# Patient Record
Sex: Female | Born: 1990 | Race: White | Hispanic: No | Marital: Single | Smoking: Current every day smoker
Health system: Southern US, Community
[De-identification: ages and names within clinical notes are randomized; demographics above are authoritative.]

## PROBLEM LIST (undated history)

## (undated) DIAGNOSIS — M25569 Pain in unspecified knee: Secondary | ICD-10-CM

## (undated) DIAGNOSIS — M2550 Pain in unspecified joint: Secondary | ICD-10-CM

## (undated) DIAGNOSIS — O24419 Gestational diabetes mellitus in pregnancy, unspecified control: Secondary | ICD-10-CM

## (undated) DIAGNOSIS — M79673 Pain in unspecified foot: Secondary | ICD-10-CM

## (undated) DIAGNOSIS — J45909 Unspecified asthma, uncomplicated: Secondary | ICD-10-CM

## (undated) DIAGNOSIS — I10 Essential (primary) hypertension: Secondary | ICD-10-CM

## (undated) DIAGNOSIS — K219 Gastro-esophageal reflux disease without esophagitis: Secondary | ICD-10-CM

## (undated) DIAGNOSIS — R7303 Prediabetes: Secondary | ICD-10-CM

## (undated) DIAGNOSIS — K5900 Constipation, unspecified: Secondary | ICD-10-CM

## (undated) DIAGNOSIS — R5383 Other fatigue: Secondary | ICD-10-CM

## (undated) DIAGNOSIS — M549 Dorsalgia, unspecified: Secondary | ICD-10-CM

## (undated) DIAGNOSIS — E669 Obesity, unspecified: Secondary | ICD-10-CM

## (undated) DIAGNOSIS — M545 Low back pain, unspecified: Secondary | ICD-10-CM

## (undated) DIAGNOSIS — G4730 Sleep apnea, unspecified: Secondary | ICD-10-CM

## (undated) DIAGNOSIS — R002 Palpitations: Secondary | ICD-10-CM

## (undated) DIAGNOSIS — R0602 Shortness of breath: Secondary | ICD-10-CM

## (undated) DIAGNOSIS — K625 Hemorrhage of anus and rectum: Secondary | ICD-10-CM

## (undated) HISTORY — DX: Sleep apnea, unspecified: G47.30

## (undated) HISTORY — DX: Dorsalgia, unspecified: M54.9

## (undated) HISTORY — PX: WISDOM TOOTH EXTRACTION: SHX21

## (undated) HISTORY — DX: Constipation, unspecified: K59.00

## (undated) HISTORY — DX: Other fatigue: R53.83

## (undated) HISTORY — DX: Shortness of breath: R06.02

## (undated) HISTORY — PX: FINGER SURGERY: SHX640

## (undated) HISTORY — PX: DRUG INDUCED ENDOSCOPY: SHX6808

## (undated) HISTORY — DX: Prediabetes: R73.03

## (undated) HISTORY — DX: Obesity, unspecified: E66.9

## (undated) HISTORY — DX: Low back pain, unspecified: M54.50

## (undated) HISTORY — DX: Hemorrhage of anus and rectum: K62.5

## (undated) HISTORY — DX: Gastro-esophageal reflux disease without esophagitis: K21.9

## (undated) HISTORY — DX: Pain in unspecified knee: M25.569

## (undated) HISTORY — DX: Pain in unspecified joint: M25.50

## (undated) HISTORY — DX: Pain in unspecified foot: M79.673

## (undated) HISTORY — PX: TONSILLECTOMY: SUR1361

## (undated) HISTORY — DX: Palpitations: R00.2

## (undated) HISTORY — DX: Unspecified asthma, uncomplicated: J45.909

---
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
## 2011-12-28 HISTORY — PX: ESOPHAGOGASTRODUODENOSCOPY: SHX1529

## 2017-10-15 DIAGNOSIS — J0100 Acute maxillary sinusitis, unspecified: Secondary | ICD-10-CM | POA: Diagnosis not present

## 2017-11-04 DIAGNOSIS — Z124 Encounter for screening for malignant neoplasm of cervix: Secondary | ICD-10-CM | POA: Diagnosis not present

## 2017-11-04 DIAGNOSIS — Z3202 Encounter for pregnancy test, result negative: Secondary | ICD-10-CM | POA: Diagnosis not present

## 2017-11-04 DIAGNOSIS — Z01419 Encounter for gynecological examination (general) (routine) without abnormal findings: Secondary | ICD-10-CM | POA: Diagnosis not present

## 2017-11-04 DIAGNOSIS — R5383 Other fatigue: Secondary | ICD-10-CM | POA: Diagnosis not present

## 2017-11-04 DIAGNOSIS — Z975 Presence of (intrauterine) contraceptive device: Secondary | ICD-10-CM | POA: Diagnosis not present

## 2017-11-04 DIAGNOSIS — R6889 Other general symptoms and signs: Secondary | ICD-10-CM | POA: Diagnosis not present

## 2017-11-04 DIAGNOSIS — Z113 Encounter for screening for infections with a predominantly sexual mode of transmission: Secondary | ICD-10-CM | POA: Diagnosis not present

## 2017-11-04 DIAGNOSIS — N898 Other specified noninflammatory disorders of vagina: Secondary | ICD-10-CM | POA: Diagnosis not present

## 2017-11-04 LAB — HM PAP SMEAR

## 2017-12-28 DIAGNOSIS — Z3046 Encounter for surveillance of implantable subdermal contraceptive: Secondary | ICD-10-CM | POA: Diagnosis not present

## 2018-03-01 DIAGNOSIS — R030 Elevated blood-pressure reading, without diagnosis of hypertension: Secondary | ICD-10-CM | POA: Diagnosis not present

## 2018-03-01 DIAGNOSIS — Z3009 Encounter for other general counseling and advice on contraception: Secondary | ICD-10-CM | POA: Diagnosis not present

## 2018-03-02 DIAGNOSIS — I10 Essential (primary) hypertension: Secondary | ICD-10-CM | POA: Diagnosis not present

## 2018-03-02 DIAGNOSIS — R8290 Unspecified abnormal findings in urine: Secondary | ICD-10-CM | POA: Diagnosis not present

## 2018-03-03 DIAGNOSIS — R8290 Unspecified abnormal findings in urine: Secondary | ICD-10-CM | POA: Diagnosis not present

## 2018-03-04 DIAGNOSIS — R8290 Unspecified abnormal findings in urine: Secondary | ICD-10-CM | POA: Insufficient documentation

## 2018-03-04 DIAGNOSIS — D72829 Elevated white blood cell count, unspecified: Secondary | ICD-10-CM | POA: Diagnosis not present

## 2018-03-04 DIAGNOSIS — I10 Essential (primary) hypertension: Secondary | ICD-10-CM | POA: Insufficient documentation

## 2018-03-11 ENCOUNTER — Telehealth: Payer: Self-pay | Admitting: Hematology and Oncology

## 2018-03-11 ENCOUNTER — Encounter: Payer: Self-pay | Admitting: Hematology and Oncology

## 2018-03-11 NOTE — Telephone Encounter (Signed)
New referral received from Dr. Judee Claraorum for elevated wbc. Pt has been scheduled to see Dr. Pamelia HoitGudena on 8/28 at 345pm. Pt aware to arrive 30 minutes early. Letter mailed.

## 2018-03-30 DIAGNOSIS — R8290 Unspecified abnormal findings in urine: Secondary | ICD-10-CM | POA: Diagnosis not present

## 2018-03-30 DIAGNOSIS — I10 Essential (primary) hypertension: Secondary | ICD-10-CM | POA: Diagnosis not present

## 2018-03-30 DIAGNOSIS — D72829 Elevated white blood cell count, unspecified: Secondary | ICD-10-CM | POA: Insufficient documentation

## 2018-03-30 HISTORY — DX: Elevated white blood cell count, unspecified: D72.829

## 2018-03-31 DIAGNOSIS — R8290 Unspecified abnormal findings in urine: Secondary | ICD-10-CM | POA: Diagnosis not present

## 2018-04-01 MED FILL — CARVEDILOL PHOSPHATE ER 20: 20 | 90 days supply | Qty: 90 | Fill #0

## 2018-04-06 ENCOUNTER — Inpatient Hospital Stay: Payer: 59

## 2018-04-06 ENCOUNTER — Ambulatory Visit: Payer: 59

## 2018-04-06 ENCOUNTER — Inpatient Hospital Stay: Payer: 59 | Attending: Hematology and Oncology | Admitting: Hematology and Oncology

## 2018-04-06 VITALS — BP 139/91 | HR 84 | Temp 98.9°F | Resp 20 | Ht 66.5 in | Wt 338.4 lb

## 2018-04-06 DIAGNOSIS — D72829 Elevated white blood cell count, unspecified: Secondary | ICD-10-CM | POA: Insufficient documentation

## 2018-04-06 DIAGNOSIS — D729 Disorder of white blood cells, unspecified: Secondary | ICD-10-CM

## 2018-04-06 LAB — CBC WITH DIFFERENTIAL (CANCER CENTER ONLY)
BASOS PCT: 1 %
Basophils Absolute: 0.1 10*3/uL (ref 0.0–0.1)
EOS ABS: 0.2 10*3/uL (ref 0.0–0.5)
EOS PCT: 2 %
HCT: 40.6 % (ref 34.8–46.6)
HEMOGLOBIN: 13.2 g/dL (ref 11.6–15.9)
Lymphocytes Relative: 27 %
Lymphs Abs: 3.3 10*3/uL (ref 0.9–3.3)
MCH: 28.2 pg (ref 25.1–34.0)
MCHC: 32.5 g/dL (ref 31.5–36.0)
MCV: 86.7 fL (ref 79.5–101.0)
MONO ABS: 0.5 10*3/uL (ref 0.1–0.9)
Monocytes Relative: 4 %
NEUTROS PCT: 66 %
Neutro Abs: 8 10*3/uL — ABNORMAL HIGH (ref 1.5–6.5)
PLATELETS: 357 10*3/uL (ref 145–400)
RBC: 4.68 MIL/uL (ref 3.70–5.45)
RDW: 13.6 % (ref 11.2–14.5)
WBC Count: 12.1 10*3/uL — ABNORMAL HIGH (ref 3.9–10.3)

## 2018-04-06 LAB — C-REACTIVE PROTEIN: CRP: 4.4 mg/dL — AB (ref <1.0)

## 2018-04-06 NOTE — Progress Notes (Signed)
[facility] CONSULT NOTE  Patient Care Team: Corum, Leeanne Mannan, MD as PCP - General (Family Medicine)  CHIEF COMPLAINTS/PURPOSE OF CONSULTATION:  Elevated neutrophil count  HISTORY OF PRESENTING ILLNESS:  Isabella Guerrero 27 y.o. female is here because of recent diagnosis of leukocytosis and elevated neutrophil count.  Patient does not have any major past medical problems.  She works at the lab at [employer].  She was noted on recent blood work since April to have elevated white blood cell count particularly elevated neutrophils.  There was also slight elevation of platelet count.  Because of this she was referred to [place] for evaluation.  She does not have any major past medical problems other than obesity.  She denies any arthritis or sinusitis are any pain anywhere.  She has some mild upper back pain but that is only recent.  I reviewed her records extensively and collaborated the history with the patient.  MEDICAL HISTORY: Patient does not have any medical problems  SURGICAL HISTORY: Tonsillectomy and wisdom tooth extraction SOCIAL HISTORY: Denies any alcohol or recreational drug use, patient smokes cigarettes half pack a day for 11 years FAMILY HISTORY: Hypertension diabetes and hypothyroidism in the mother, hypertension in the father pancreatic cancer maternal grandmother ALLERGIES: No known drug allergies MEDICATIONS: Albuterol metered-dose inhaler REVIEW OF SYSTEMS:   Constitutional: Denies fevers, chills or abnormal night sweats Eyes: Denies blurriness of vision, double vision or watery eyes Ears, nose, mouth, throat, and face: Denies mucositis or sore throat Respiratory: Denies cough, dyspnea or wheezes Cardiovascular: Denies palpitation, chest discomfort or lower extremity swelling Gastrointestinal:  Denies nausea, heartburn or change in bowel habits Skin: Denies abnormal skin rashes Lymphatics: Denies new lymphadenopathy or easy bruising Neurological:Denies  numbness, tingling or new weaknesses Behavioral/Psych: Mood is stable, no new changes    All other systems were reviewed with the patient and are negative.  PHYSICAL EXAMINATION: ECOG PERFORMANCE STATUS: 0 - Asymptomatic  Vitals:   04/06/18 1543  BP: (!) 139/91  Pulse: 84  Resp: 20  Temp: 98.9 F (37.2 C)  SpO2: 97%   Filed Weights   04/06/18 1543  Weight: (!) 338 lb 6.4 oz (153.5 kg)    GENERAL:alert, no distress and comfortable SKIN: skin color, texture, turgor are normal, no rashes or significant lesions EYES: normal, conjunctiva are pink and non-injected, sclera clear OROPHARYNX:no exudate, no erythema and lips, buccal mucosa, and tongue normal  NECK: supple, thyroid normal size, non-tender, without nodularity LYMPH:  no palpable lymphadenopathy in the cervical, axillary or inguinal LUNGS: clear to auscultation and percussion with normal breathing effort HEART: regular rate & rhythm and no murmurs and no lower extremity edema ABDOMEN:abdomen soft, non-tender and normal bowel sounds Musculoskeletal:no cyanosis of digits and no clubbing  PSYCH: alert & oriented x 3 with fluent speech NEURO: no focal motor/sensory deficits  LABORATORY DATA:  I have reviewed the data as listed Lab Results  Component Value Date   WBC 12.1 (H) 04/06/2018   HGB 13.2 04/06/2018   HCT 40.6 04/06/2018   MCV 86.7 04/06/2018   PLT 357 04/06/2018   No results found for: NA, K, CL, CO2  RADIOGRAPHIC STUDIES: I have personally reviewed the radiological reports and agreed with the findings in the report.  ASSESSMENT AND PLAN:  Neutrophilia Differential diagnosis of mild leukocytosis (WBC count 13.8, ANC 9.3 on 11/10/2017 Previous blood work review revealed that patient had chronic mild leukocytosis  Differential diagnosis of chronic neutrophilia 1. Inflammations: The only probable inflammation is  gastritis 2. infections: Unlikely in her situation 3. Medications: Patient does not take any  steroids 4. Bone marrow disorders  Workup: 1. CBC with differential to evaluate the peripheral smear: WBC 12.1, neutrophils 8, platelets 357 2. flow cytometry for leukemia 3. CRP  If all the above tests are normal, we can see the patient on an as-needed basis. I will call her with the results of these tests   All questions were answered. The patient knows to call the clinic with any problems, questions or concerns.    Tamsen MeekViinay K Chaz Ronning, MD 04/06/18

## 2018-04-06 NOTE — Assessment & Plan Note (Signed)
Differential diagnosis of mild leukocytosis (WBC count 13.8, ANC 9.3 on 11/10/2017 Previous blood work review revealed that patient had chronic mild leukocytosis  Differential diagnosis of chronic neutrophilia 1. Inflammations: The only probable inflammation is gastritis 2. infections: Unlikely in her situation 3. Medications: Patient does not take any steroids 4. Bone marrow disorders  Workup: 1. CBC with differential to evaluate the peripheral smear  2. flow cytometry for leukemia 3. CRP  If all the above tests are normal, we can see the patient on an as-needed basis. I will call her with the results of these tests

## 2018-04-12 LAB — FLOW CYTOMETRY

## 2018-06-15 MED FILL — CARVEDILOL PHOSPHATE ER 20: 20 | 90 days supply | Qty: 90 | Fill #1

## 2018-07-12 DIAGNOSIS — I10 Essential (primary) hypertension: Secondary | ICD-10-CM | POA: Diagnosis not present

## 2018-07-12 DIAGNOSIS — N926 Irregular menstruation, unspecified: Secondary | ICD-10-CM | POA: Diagnosis not present

## 2018-07-12 MED FILL — LABETALOL HCL 100 MG TABLET: 100 | 30 days supply | Qty: 60 | Fill #0

## 2018-07-13 DIAGNOSIS — N926 Irregular menstruation, unspecified: Secondary | ICD-10-CM

## 2018-07-13 HISTORY — DX: Irregular menstruation, unspecified: N92.6

## 2018-08-01 DIAGNOSIS — J0100 Acute maxillary sinusitis, unspecified: Secondary | ICD-10-CM | POA: Diagnosis not present

## 2018-08-17 DIAGNOSIS — N911 Secondary amenorrhea: Secondary | ICD-10-CM | POA: Diagnosis not present

## 2018-08-17 DIAGNOSIS — Z3201 Encounter for pregnancy test, result positive: Secondary | ICD-10-CM | POA: Diagnosis not present

## 2018-08-17 MED FILL — LABETALOL HCL 100 MG TABLET: 100 | 30 days supply | Qty: 60 | Fill #1

## 2018-08-24 DIAGNOSIS — Z3490 Encounter for supervision of normal pregnancy, unspecified, unspecified trimester: Secondary | ICD-10-CM

## 2018-08-24 DIAGNOSIS — Z368A Encounter for antenatal screening for other genetic defects: Secondary | ICD-10-CM | POA: Diagnosis not present

## 2018-08-24 DIAGNOSIS — Z113 Encounter for screening for infections with a predominantly sexual mode of transmission: Secondary | ICD-10-CM | POA: Diagnosis not present

## 2018-08-24 DIAGNOSIS — Z363 Encounter for antenatal screening for malformations: Secondary | ICD-10-CM | POA: Diagnosis not present

## 2018-08-24 DIAGNOSIS — O10011 Pre-existing essential hypertension complicating pregnancy, first trimester: Secondary | ICD-10-CM | POA: Diagnosis not present

## 2018-08-24 DIAGNOSIS — O26891 Other specified pregnancy related conditions, first trimester: Secondary | ICD-10-CM | POA: Diagnosis not present

## 2018-08-24 DIAGNOSIS — O99211 Obesity complicating pregnancy, first trimester: Secondary | ICD-10-CM | POA: Diagnosis not present

## 2018-08-24 DIAGNOSIS — Z3689 Encounter for other specified antenatal screening: Secondary | ICD-10-CM | POA: Diagnosis not present

## 2018-08-24 DIAGNOSIS — Z3682 Encounter for antenatal screening for nuchal translucency: Secondary | ICD-10-CM | POA: Diagnosis not present

## 2018-08-24 DIAGNOSIS — Z3A12 12 weeks gestation of pregnancy: Secondary | ICD-10-CM | POA: Diagnosis not present

## 2018-08-24 HISTORY — DX: Encounter for supervision of normal pregnancy, unspecified, unspecified trimester: Z34.90

## 2018-08-24 LAB — OB RESULTS CONSOLE GC/CHLAMYDIA
Chlamydia: NEGATIVE
Gonorrhea: NEGATIVE

## 2018-08-24 LAB — OB RESULTS CONSOLE RPR: RPR: NONREACTIVE

## 2018-08-24 LAB — OB RESULTS CONSOLE RUBELLA ANTIBODY, IGM: Rubella: IMMUNE

## 2018-08-24 LAB — OB RESULTS CONSOLE HEPATITIS B SURFACE ANTIGEN: Hepatitis B Surface Ag: NEGATIVE

## 2018-08-24 LAB — OB RESULTS CONSOLE HIV ANTIBODY (ROUTINE TESTING): HIV: NONREACTIVE

## 2018-08-24 LAB — OB RESULTS CONSOLE ANTIBODY SCREEN: Antibody Screen: NEGATIVE

## 2018-08-24 LAB — OB RESULTS CONSOLE ABO/RH: RH Type: NEGATIVE

## 2018-09-14 MED FILL — LABETALOL HCL 100 MG TABS: 100 | 30 days supply | Qty: 60 | Fill #0

## 2018-09-14 MED FILL — METOCLOPRAMIDE 10 MG TABLET: 10 | 30 days supply | Qty: 90 | Fill #0

## 2018-09-14 MED FILL — PANTOPRAZOLE SOD DR 40 MG T: 40 | 30 days supply | Qty: 30 | Fill #0

## 2018-09-29 MED FILL — CYCLOBENZAPRINE HCL 10 MG T: 10 | 10 days supply | Qty: 30 | Fill #0

## 2018-10-13 DIAGNOSIS — O99212 Obesity complicating pregnancy, second trimester: Secondary | ICD-10-CM | POA: Diagnosis not present

## 2018-10-13 DIAGNOSIS — Z363 Encounter for antenatal screening for malformations: Secondary | ICD-10-CM | POA: Diagnosis not present

## 2018-10-13 DIAGNOSIS — Z3A19 19 weeks gestation of pregnancy: Secondary | ICD-10-CM | POA: Diagnosis not present

## 2018-10-13 MED FILL — PANTOPRAZOLE SOD DR 40 MG T: 40 | 30 days supply | Qty: 30 | Fill #1

## 2018-10-13 MED FILL — LABETALOL HCL 100 MG TABS: 100 | 30 days supply | Qty: 60 | Fill #1

## 2018-10-13 MED FILL — CYCLOBENZAPRINE HCL 10 MG T: 10 | 10 days supply | Qty: 30 | Fill #1

## 2018-10-13 MED FILL — METOCLOPRAMIDE 10 MG TABLET: 10 | 30 days supply | Qty: 90 | Fill #1

## 2018-11-03 DIAGNOSIS — Z362 Encounter for other antenatal screening follow-up: Secondary | ICD-10-CM | POA: Diagnosis not present

## 2018-11-03 DIAGNOSIS — Z3A22 22 weeks gestation of pregnancy: Secondary | ICD-10-CM | POA: Diagnosis not present

## 2018-11-03 DIAGNOSIS — O99212 Obesity complicating pregnancy, second trimester: Secondary | ICD-10-CM | POA: Diagnosis not present

## 2018-11-11 MED FILL — PANTOPRAZOLE SOD DR 40 MG T: 40 | 90 days supply | Qty: 90 | Fill #2

## 2018-11-30 MED FILL — METOCLOPRAMIDE 10 MG TABLET: 10 | 30 days supply | Qty: 60 | Fill #0

## 2018-12-09 DIAGNOSIS — O24419 Gestational diabetes mellitus in pregnancy, unspecified control: Secondary | ICD-10-CM

## 2018-12-09 HISTORY — DX: Gestational diabetes mellitus in pregnancy, unspecified control: O24.419

## 2018-12-15 DIAGNOSIS — Z6791 Unspecified blood type, Rh negative: Secondary | ICD-10-CM | POA: Diagnosis not present

## 2018-12-15 DIAGNOSIS — Z3A28 28 weeks gestation of pregnancy: Secondary | ICD-10-CM | POA: Diagnosis not present

## 2018-12-15 DIAGNOSIS — I10 Essential (primary) hypertension: Secondary | ICD-10-CM | POA: Diagnosis not present

## 2018-12-15 DIAGNOSIS — K219 Gastro-esophageal reflux disease without esophagitis: Secondary | ICD-10-CM | POA: Diagnosis not present

## 2018-12-15 DIAGNOSIS — O99213 Obesity complicating pregnancy, third trimester: Secondary | ICD-10-CM | POA: Diagnosis not present

## 2018-12-15 DIAGNOSIS — Z3689 Encounter for other specified antenatal screening: Secondary | ICD-10-CM | POA: Diagnosis not present

## 2018-12-15 DIAGNOSIS — Z23 Encounter for immunization: Secondary | ICD-10-CM | POA: Diagnosis not present

## 2018-12-19 DIAGNOSIS — O99810 Abnormal glucose complicating pregnancy: Secondary | ICD-10-CM | POA: Diagnosis not present

## 2018-12-20 MED FILL — PANTOPRAZOLE SOD DR 40 MG T: 40 | 90 days supply | Qty: 180 | Fill #0

## 2018-12-20 MED FILL — LABETALOL HCL 100 MG TABLET: 100 | 30 days supply | Qty: 60 | Fill #2

## 2018-12-22 ENCOUNTER — Encounter: Payer: Self-pay | Admitting: Dietician

## 2018-12-22 ENCOUNTER — Ambulatory Visit: Payer: 59 | Admitting: Dietician

## 2018-12-22 DIAGNOSIS — O24410 Gestational diabetes mellitus in pregnancy, diet controlled: Secondary | ICD-10-CM

## 2018-12-22 NOTE — Progress Notes (Signed)
This visit was completed via telephone due to the COVID-19 pandemic.   I spoke with Isabella Guerrero and verified that I was speaking with the correct person with two patient identifiers (full name and date of birth).   I discussed the limitations related to this kind of visit and the patient is willing to proceed.  Time:  1555-1700  Patient lives with her boyfriend.  She does the shopping and cooking. She is a Water quality scientist at [employer].  She is currently on furlough due to COVID.  She states that she has a good support system.  Her mom has type 1 diabetes diagnosed in her 22's.  She is accustomed to checking her BG occasionally when low. She quit smoking when she became pregnant.  Weight hx:  339 lbs prior to pregnancy and 329 lbs this week.  Weight loss related to nausea and vomiting during the whole pregnancy.  She is active but overall not getting specific exercise.  Diet hx:  Today More nausea with eggs, meat, sometimes vegetables Breakfast:  Egg sandwich on white toast with mayo and cheese (ate 3/4 and vomited) Then ate 1/4 sandwich Lunch:  Skips due to nausea daily Snack:  Apple, pickles Dinner:  Salad, boiled egg, [place] dressing Snack:  Yogurt Beverages:  Water, Whole milk, camomile and peppermint tea   This referral for GDM was completed 12/22/2018 via phoen due to COVID 19.   States the definition of Gestational Diabetes  States why dietary management is important in controlling blood glucose  Describes the effects each nutrient has on blood glucose levels  Demonstrates ability to create a balanced meal plan  Demonstrates carbohydrate counting   States when to check blood glucose levels  Demonstrates proper blood glucose monitoring techniques  States the effect of stress and exercise on blood glucose levels  Patient instructed to monitor glucose levels: FBS: 60 - <90 1 hour: <140 2 hour: <120  *Patient received handouts: via e-mail  Nutrition Diabetes and  Pregnancy  TNH healthy pregnancy info  She was instructed to call and request meter, strips and lancing needles from her OB to be sent to the [facility] of her choice..  Patient will be seen for follow-up as needed.

## 2018-12-23 MED FILL — FREESTYLE LITE TEST STRIP: 25 days supply | Qty: 100 | Fill #0

## 2018-12-23 MED FILL — FREESTYLE LITE METER: 30 days supply | Qty: 1 | Fill #0

## 2018-12-23 MED FILL — FREESTYLE LANCETS: 25 days supply | Qty: 100 | Fill #0

## 2018-12-28 MED FILL — ONDANSETRON ODT 8 MG TABLET: 8 | 10 days supply | Qty: 30 | Fill #0

## 2019-01-16 DIAGNOSIS — Z3A32 32 weeks gestation of pregnancy: Secondary | ICD-10-CM | POA: Diagnosis not present

## 2019-01-16 DIAGNOSIS — O3663X0 Maternal care for excessive fetal growth, third trimester, not applicable or unspecified: Secondary | ICD-10-CM | POA: Diagnosis not present

## 2019-01-18 MED FILL — LABETALOL HCL 100 MG TABLET: 100 | 30 days supply | Qty: 60 | Fill #3

## 2019-01-18 MED FILL — METOCLOPRAMIDE 10 MG TABLET: 10 | 30 days supply | Qty: 60 | Fill #1

## 2019-02-06 DIAGNOSIS — Z3A35 35 weeks gestation of pregnancy: Secondary | ICD-10-CM | POA: Diagnosis not present

## 2019-02-06 DIAGNOSIS — O99213 Obesity complicating pregnancy, third trimester: Secondary | ICD-10-CM | POA: Diagnosis not present

## 2019-02-06 DIAGNOSIS — O3663X0 Maternal care for excessive fetal growth, third trimester, not applicable or unspecified: Secondary | ICD-10-CM | POA: Diagnosis not present

## 2019-02-08 DIAGNOSIS — O321XX0 Maternal care for breech presentation, not applicable or unspecified: Secondary | ICD-10-CM | POA: Diagnosis not present

## 2019-02-08 DIAGNOSIS — Z3A36 36 weeks gestation of pregnancy: Secondary | ICD-10-CM | POA: Diagnosis not present

## 2019-02-08 DIAGNOSIS — O99213 Obesity complicating pregnancy, third trimester: Secondary | ICD-10-CM | POA: Diagnosis not present

## 2019-02-08 DIAGNOSIS — O10013 Pre-existing essential hypertension complicating pregnancy, third trimester: Secondary | ICD-10-CM | POA: Diagnosis not present

## 2019-02-08 DIAGNOSIS — Z3685 Encounter for antenatal screening for Streptococcus B: Secondary | ICD-10-CM | POA: Diagnosis not present

## 2019-02-08 DIAGNOSIS — O24410 Gestational diabetes mellitus in pregnancy, diet controlled: Secondary | ICD-10-CM | POA: Diagnosis not present

## 2019-02-13 MED FILL — METOCLOPRAMIDE 10 MG TABLET: 10 | 30 days supply | Qty: 60 | Fill #2

## 2019-02-20 ENCOUNTER — Encounter (HOSPITAL_COMMUNITY): Payer: Self-pay | Admitting: *Deleted

## 2019-02-20 NOTE — Patient Instructions (Signed)
Isabella Guerrero  [birth year]   Your procedure is scheduled on:  03/01/2019  Arrive at 57 at Entrance C on [address] [facility]  and [address]. You are invited to use the FREE valet parking or use the Visitor's parking deck.  Pick up the phone at the desk and dial [phone].  Call this number if you have problems the morning of surgery: [phone]  Remember:   Do not eat food:(After Midnight) Desps de medianoche.  Do not drink clear liquids: (After Midnight) Desps de medianoche.  Take these medicines the morning of surgery with A SIP OF WATER:  Take your labetalol and reglan as prescribed   Do not wear jewelry, make-up or nail polish.  Do not wear lotions, powders, or perfumes. Do not wear deodorant.  Do not shave 48 hours prior to surgery.  Do not bring valuables to the hospital.  [facility] is not   responsible for any belongings or valuables brought to the hospital.  Contacts, dentures or bridgework may not be worn into surgery.  Leave suitcase in the car. After surgery it may be brought to your room.  For patients admitted to the hospital, checkout time is 11:00 AM the day of              discharge.      Please read over the following fact sheets that you were given:     Preparing for Surgery

## 2019-02-23 DIAGNOSIS — O368130 Decreased fetal movements, third trimester, not applicable or unspecified: Secondary | ICD-10-CM | POA: Diagnosis not present

## 2019-02-23 DIAGNOSIS — Z3A38 38 weeks gestation of pregnancy: Secondary | ICD-10-CM | POA: Diagnosis not present

## 2019-02-27 ENCOUNTER — Ambulatory Visit (HOSPITAL_COMMUNITY)
Admission: RE | Admit: 2019-02-27 | Discharge: 2019-02-27 | Disposition: A | Discharge status: Home / Self Care | Payer: 59 | Source: Ambulatory Visit | Attending: Obstetrics and Gynecology | Admitting: Obstetrics and Gynecology

## 2019-02-27 ENCOUNTER — Other Ambulatory Visit: Payer: Self-pay

## 2019-02-27 DIAGNOSIS — O99214 Obesity complicating childbirth: Secondary | ICD-10-CM | POA: Diagnosis not present

## 2019-02-27 DIAGNOSIS — O24420 Gestational diabetes mellitus in childbirth, diet controlled: Secondary | ICD-10-CM | POA: Diagnosis not present

## 2019-02-27 DIAGNOSIS — Z87891 Personal history of nicotine dependence: Secondary | ICD-10-CM | POA: Diagnosis not present

## 2019-02-27 DIAGNOSIS — O26893 Other specified pregnancy related conditions, third trimester: Secondary | ICD-10-CM | POA: Diagnosis not present

## 2019-02-27 DIAGNOSIS — O1002 Pre-existing essential hypertension complicating childbirth: Secondary | ICD-10-CM | POA: Diagnosis not present

## 2019-02-27 DIAGNOSIS — O321XX0 Maternal care for breech presentation, not applicable or unspecified: Secondary | ICD-10-CM | POA: Diagnosis not present

## 2019-02-27 DIAGNOSIS — Z1159 Encounter for screening for other viral diseases: Secondary | ICD-10-CM

## 2019-02-27 DIAGNOSIS — O99824 Streptococcus B carrier state complicating childbirth: Secondary | ICD-10-CM | POA: Diagnosis not present

## 2019-02-27 HISTORY — DX: Essential (primary) hypertension: I10

## 2019-02-27 HISTORY — DX: Gestational diabetes mellitus in pregnancy, unspecified control: O24.419

## 2019-02-27 LAB — SARS CORONAVIRUS 2 BY RT PCR (HOSPITAL ORDER, PERFORMED IN ~~LOC~~ HOSPITAL LAB): SARS Coronavirus 2: NEGATIVE

## 2019-02-27 NOTE — MAU Note (Signed)
Covid swab collected. Pt tolerated well. PT asymptomatic 

## 2019-02-28 NOTE — H&P (Signed)
Isabella Guerrero is a 28 y.o. female  G1P0 at 42 0/7 weeks (EDD 03/08/19 by LMP c/w 12 week [place])  presenting for scheduled primary c-section for persistent breech lie.  Prenatal care significant for morbid obesity with BMI 64, gestational diabetes that is well-controlled on diet.  She also has a h/o mild CHTN and has been stable on labetalol 50mg  po BID the entire pregnancy.  She has normal baseline labs and has taken baby ASA the entire pregnancy.  She quit smoking with pregnancy and is varicella non-immune.    OB History    Gravida  1   Para      Term      Preterm      AB      Living        SAB      TAB      Ectopic      Multiple      Live Births             Past Medical History:  Diagnosis Date  . GDM (gestational diabetes mellitus) 12/2018  . Gestational diabetes   . Hypertension    Past Surgical History:  Procedure Laterality Date  . DRUG INDUCED ENDOSCOPY    . FINGER SURGERY    . TONSILLECTOMY    . WISDOM TOOTH EXTRACTION     Family History: family history includes Colon cancer in her maternal grandmother; Diabetes in her mother; Esophageal cancer in her maternal uncle; Heart disease in her mother; Hypertension in her father and mother. Social History:  reports that she has quit smoking. She has never used smokeless tobacco. She reports previous alcohol use. She reports that she does not use drugs.     Maternal Diabetes: Yes:  Diabetes Type:  Diet controlled Genetic Screening: Declined Maternal Ultrasounds/Referrals: Normal Fetal Ultrasounds or other Referrals:  None Maternal Substance Abuse:  No Significant Maternal Medications:  Meds include: Other:  Significant Maternal Lab Results:  Group B Strep positive and Rh negative Other Comments:  None  Review of Systems  Constitutional: Negative for fever.  Cardiovascular: Positive for leg swelling.  Gastrointestinal: Negative for abdominal pain.  Neurological: Negative for headaches.   Maternal Medical  History:  Contractions: Frequency: rare.   Perceived severity is mild.    Fetal activity: Perceived fetal activity is normal.    Prenatal Complications - Diabetes: gestational. Diabetes is managed by diet.        Last menstrual period 06/01/2018. Exam Physical Exam  Prenatal labs: ABO, Rh: A/Negative/-- (01/15 0000) Antibody: Negative (01/15 0000) Rubella: Immune (01/15 0000) RPR: Nonreactive (01/15 0000)  HBsAg: Negative (01/15 0000)  HIV: Non-reactive (01/15 0000)  GBS:   Positive One hour GCT 140 Three hour GTT abnormal  Assessment/Plan: I have d/w pt c-section in detail for persistent breech lie.  Reviewed risks of bleeding, infection and possible damage to bowel or bladder.  Ready to proceed.  Logan Bores 02/28/2019, 4:24 PM

## 2019-02-28 NOTE — Anesthesia Preprocedure Evaluation (Addendum)
Anesthesia Evaluation  Patient identified by MRN, date of birth, ID band Patient awake    Reviewed: Allergy & Precautions, NPO status , Patient's Chart, lab work & pertinent test results  Airway Mallampati: II  TM Distance: >3 FB Neck ROM: Full    Dental no notable dental hx.    Pulmonary former smoker,    Pulmonary exam normal breath sounds clear to auscultation       Cardiovascular hypertension, Pt. on home beta blockers and Pt. on medications Normal cardiovascular exam Rhythm:Regular Rate:Normal     Neuro/Psych negative neurological ROS  negative psych ROS   GI/Hepatic negative GI ROS, Neg liver ROS,   Endo/Other  diabetes, GestationalMorbid obesity  Renal/GU negative Renal ROS     Musculoskeletal negative musculoskeletal ROS (+)   Abdominal (+) + obese,   Peds  Hematology negative hematology ROS (+)   Anesthesia Other Findings Breech  Reproductive/Obstetrics                            Anesthesia Physical Anesthesia Plan  ASA: IV  Anesthesia Plan: Spinal   Post-op Pain Management:    Induction:   PONV Risk Score and Plan: 2 and Ondansetron, Dexamethasone and Treatment may vary due to age or medical condition  Airway Management Planned: Natural Airway  Additional Equipment:   Intra-op Plan:   Post-operative Plan:   Informed Consent: I have reviewed the patients History and Physical, chart, labs and discussed the procedure including the risks, benefits and alternatives for the proposed anesthesia with the patient or authorized representative who has indicated his/her understanding and acceptance.     Dental advisory given  Plan Discussed with: CRNA  Anesthesia Plan Comments:        Anesthesia Quick Evaluation

## 2019-03-01 ENCOUNTER — Encounter (HOSPITAL_COMMUNITY): Payer: Self-pay

## 2019-03-01 ENCOUNTER — Inpatient Hospital Stay (HOSPITAL_COMMUNITY)
Admission: RE | Admit: 2019-03-01 | Discharge: 2019-03-03 | DRG: 787 | Disposition: A | Discharge status: Home / Self Care | Payer: 59 | Attending: Obstetrics and Gynecology | Admitting: Obstetrics and Gynecology

## 2019-03-01 ENCOUNTER — Other Ambulatory Visit: Payer: Self-pay

## 2019-03-01 ENCOUNTER — Inpatient Hospital Stay (HOSPITAL_COMMUNITY): Payer: 59 | Admitting: Anesthesiology

## 2019-03-01 ENCOUNTER — Encounter (HOSPITAL_COMMUNITY)
Admission: RE | Disposition: A | Discharge status: Home / Self Care | Payer: Self-pay | Source: Home / Self Care | Attending: Obstetrics and Gynecology

## 2019-03-01 DIAGNOSIS — Z1159 Encounter for screening for other viral diseases: Secondary | ICD-10-CM

## 2019-03-01 DIAGNOSIS — Z3A00 Weeks of gestation of pregnancy not specified: Secondary | ICD-10-CM | POA: Diagnosis not present

## 2019-03-01 DIAGNOSIS — Z98891 History of uterine scar from previous surgery: Secondary | ICD-10-CM

## 2019-03-01 DIAGNOSIS — O10013 Pre-existing essential hypertension complicating pregnancy, third trimester: Secondary | ICD-10-CM | POA: Diagnosis not present

## 2019-03-01 DIAGNOSIS — O1002 Pre-existing essential hypertension complicating childbirth: Secondary | ICD-10-CM | POA: Diagnosis present

## 2019-03-01 DIAGNOSIS — Z3A39 39 weeks gestation of pregnancy: Secondary | ICD-10-CM

## 2019-03-01 DIAGNOSIS — O99214 Obesity complicating childbirth: Secondary | ICD-10-CM | POA: Diagnosis present

## 2019-03-01 DIAGNOSIS — Z6791 Unspecified blood type, Rh negative: Secondary | ICD-10-CM

## 2019-03-01 DIAGNOSIS — O321XX0 Maternal care for breech presentation, not applicable or unspecified: Secondary | ICD-10-CM | POA: Diagnosis not present

## 2019-03-01 DIAGNOSIS — O99213 Obesity complicating pregnancy, third trimester: Secondary | ICD-10-CM | POA: Diagnosis not present

## 2019-03-01 DIAGNOSIS — Z87891 Personal history of nicotine dependence: Secondary | ICD-10-CM | POA: Diagnosis not present

## 2019-03-01 DIAGNOSIS — O99824 Streptococcus B carrier state complicating childbirth: Secondary | ICD-10-CM | POA: Diagnosis present

## 2019-03-01 DIAGNOSIS — O26893 Other specified pregnancy related conditions, third trimester: Secondary | ICD-10-CM | POA: Diagnosis present

## 2019-03-01 DIAGNOSIS — O24420 Gestational diabetes mellitus in childbirth, diet controlled: Secondary | ICD-10-CM | POA: Diagnosis present

## 2019-03-01 HISTORY — DX: History of uterine scar from previous surgery: Z98.891

## 2019-03-01 HISTORY — DX: Maternal care for breech presentation, not applicable or unspecified: O32.1XX0

## 2019-03-01 LAB — CBC
HCT: 38.5 % (ref 36.0–46.0)
Hemoglobin: 12.1 g/dL (ref 12.0–15.0)
MCH: 27.6 pg (ref 26.0–34.0)
MCHC: 31.4 g/dL (ref 30.0–36.0)
MCV: 87.9 fL (ref 80.0–100.0)
Platelets: 400 10*3/uL (ref 150–400)
RBC: 4.38 MIL/uL (ref 3.87–5.11)
RDW: 12.8 % (ref 11.5–15.5)
WBC: 15.6 10*3/uL — ABNORMAL HIGH (ref 4.0–10.5)
nRBC: 0 % (ref 0.0–0.2)

## 2019-03-01 LAB — TYPE AND SCREEN
ABO/RH(D): A NEG
Antibody Screen: NEGATIVE

## 2019-03-01 LAB — BASIC METABOLIC PANEL
Anion gap: 9 (ref 5–15)
BUN: 11 mg/dL (ref 6–20)
CO2: 23 mmol/L (ref 22–32)
Calcium: 9.4 mg/dL (ref 8.9–10.3)
Chloride: 104 mmol/L (ref 98–111)
Creatinine, Ser: 0.63 mg/dL (ref 0.44–1.00)
GFR calc Af Amer: >60 mL/min (ref >60)
GFR calc non Af Amer: >60 mL/min (ref >60)
Glucose, Bld: 120 mg/dL — ABNORMAL HIGH (ref 70–99)
Potassium: 4.5 mmol/L (ref 3.5–5.1)
Sodium: 136 mmol/L (ref 135–145)

## 2019-03-01 LAB — GLUCOSE, CAPILLARY
Glucose-Capillary: 116 mg/dL — ABNORMAL HIGH (ref 70–99)
Glucose-Capillary: 98 mg/dL (ref 70–99)

## 2019-03-01 LAB — ABO/RH: ABO/RH(D): A NEG

## 2019-03-01 SURGERY — Surgical Case
Anesthesia: Spinal

## 2019-03-01 MED ORDER — TETANUS-DIPHTH-ACELL PERTUSSIS 5-2.5-18.5 LF-MCG/0.5 IM SUSP: 0.5000 mL | Freq: Once | INTRAMUSCULAR | Status: DC

## 2019-03-01 MED ORDER — DIPHENHYDRAMINE HCL 25 MG PO CAPS: 25.0000 mg | ORAL_CAPSULE | ORAL | Status: DC | PRN

## 2019-03-01 MED ORDER — OXYTOCIN 40 UNITS IN NORMAL SALINE INFUSION - SIMPLE MED: INTRAVENOUS | Status: DC | PRN

## 2019-03-01 MED ORDER — ALBUTEROL SULFATE HFA 108 (90 BASE) MCG/ACT IN AERS: 2.0000 | INHALATION_SPRAY | RESPIRATORY_TRACT | Status: DC

## 2019-03-01 MED ORDER — SOD CITRATE-CITRIC ACID 500-334 MG/5ML PO SOLN
ORAL | Status: AC
  Filled 2019-03-01: qty 30

## 2019-03-01 MED ORDER — NALBUPHINE HCL 10 MG/ML IJ SOLN: 5.0000 mg | INTRAMUSCULAR | Status: DC | PRN

## 2019-03-01 MED ORDER — ONDANSETRON HCL 4 MG/2ML IJ SOLN: 4.0000 mg | Freq: Three times a day (TID) | INTRAMUSCULAR | Status: DC | PRN

## 2019-03-01 MED ORDER — ACETAMINOPHEN 500 MG PO TABS
1000.0000 mg | ORAL_TABLET | Freq: Four times a day (QID) | ORAL | Status: DC
  Administered 2019-03-01 – 2019-03-03 (×8): 1000 mg via ORAL
  Filled 2019-03-01 (×10): qty 2

## 2019-03-01 MED ORDER — NALBUPHINE HCL 10 MG/ML IJ SOLN: 5.0000 mg | Freq: Once | INTRAMUSCULAR | Status: DC | PRN

## 2019-03-01 MED ORDER — PRENATAL MULTIVITAMIN CH
1.0000 | ORAL_TABLET | Freq: Every day | ORAL | Status: DC
  Administered 2019-03-02 – 2019-03-03 (×2): 1 via ORAL
  Filled 2019-03-01 (×2): qty 1

## 2019-03-01 MED ORDER — HYDROMORPHONE HCL 1 MG/ML IJ SOLN
INTRAMUSCULAR | Status: AC
  Filled 2019-03-01: qty 1

## 2019-03-01 MED ORDER — PHENYLEPHRINE 40 MCG/ML (10ML) SYRINGE FOR IV PUSH (FOR BLOOD PRESSURE SUPPORT)
PREFILLED_SYRINGE | INTRAVENOUS | Status: AC
  Filled 2019-03-01: qty 10

## 2019-03-01 MED ORDER — DIPHENHYDRAMINE HCL 50 MG/ML IJ SOLN: 12.5000 mg | Freq: Four times a day (QID) | INTRAMUSCULAR | Status: DC | PRN

## 2019-03-01 MED ORDER — ENOXAPARIN SODIUM 80 MG/0.8ML ~~LOC~~ SOLN
80.0000 mg | SUBCUTANEOUS | Status: DC
  Administered 2019-03-02 – 2019-03-03 (×2): 80 mg via SUBCUTANEOUS
  Filled 2019-03-01 (×2): qty 0.8

## 2019-03-01 MED ORDER — NALOXONE HCL 4 MG/10ML IJ SOLN
1.0000 ug/kg/h | INTRAVENOUS | Status: DC | PRN
  Filled 2019-03-01: qty 5

## 2019-03-01 MED ORDER — OXYTOCIN 10 UNIT/ML IJ SOLN
INTRAMUSCULAR | Status: DC | PRN
  Administered 2019-03-01: 40 [IU]

## 2019-03-01 MED ORDER — LACTATED RINGERS IV SOLN
INTRAVENOUS | Status: DC
  Administered 2019-03-01 (×2): via INTRAVENOUS

## 2019-03-01 MED ORDER — PHENYLEPHRINE HCL-NACL 20-0.9 MG/250ML-% IV SOLN
INTRAVENOUS | Status: AC
  Filled 2019-03-01: qty 250

## 2019-03-01 MED ORDER — SIMETHICONE 80 MG PO CHEW
80.0000 mg | CHEWABLE_TABLET | ORAL | Status: DC
  Administered 2019-03-02 (×2): 80 mg via ORAL
  Filled 2019-03-01 (×2): qty 1

## 2019-03-01 MED ORDER — PROMETHAZINE HCL 25 MG/ML IJ SOLN: 6.2500 mg | INTRAMUSCULAR | Status: DC | PRN

## 2019-03-01 MED ORDER — DEXAMETHASONE SODIUM PHOSPHATE 10 MG/ML IJ SOLN
INTRAMUSCULAR | Status: AC
  Filled 2019-03-01: qty 1

## 2019-03-01 MED ORDER — DEXAMETHASONE SODIUM PHOSPHATE 10 MG/ML IJ SOLN
INTRAMUSCULAR | Status: DC | PRN
  Administered 2019-03-01: 10 mg via INTRAVENOUS

## 2019-03-01 MED ORDER — ONDANSETRON HCL 4 MG/2ML IJ SOLN
INTRAMUSCULAR | Status: AC
  Filled 2019-03-01: qty 2

## 2019-03-01 MED ORDER — PANTOPRAZOLE SODIUM 40 MG PO TBEC
40.0000 mg | DELAYED_RELEASE_TABLET | Freq: Two times a day (BID) | ORAL | Status: DC
  Administered 2019-03-01 – 2019-03-03 (×4): 40 mg via ORAL
  Filled 2019-03-01 (×4): qty 1

## 2019-03-01 MED ORDER — BUPIVACAINE IN DEXTROSE 0.75-8.25 % IT SOLN
INTRATHECAL | Status: DC | PRN
  Administered 2019-03-01: 1.6 mL via INTRATHECAL

## 2019-03-01 MED ORDER — IBUPROFEN 800 MG PO TABS
800.0000 mg | ORAL_TABLET | Freq: Three times a day (TID) | ORAL | Status: DC
  Administered 2019-03-01 – 2019-03-03 (×7): 800 mg via ORAL
  Filled 2019-03-01 (×7): qty 1

## 2019-03-01 MED ORDER — NALOXONE HCL 0.4 MG/ML IJ SOLN: 0.4000 mg | INTRAMUSCULAR | Status: DC | PRN

## 2019-03-01 MED ORDER — METOCLOPRAMIDE HCL 5 MG/ML IJ SOLN
INTRAMUSCULAR | Status: DC | PRN
  Administered 2019-03-01: 10 mg via INTRAVENOUS

## 2019-03-01 MED ORDER — LACTATED RINGERS IV SOLN
INTRAVENOUS | Status: DC
  Administered 2019-03-01 – 2019-03-02 (×2): via INTRAVENOUS

## 2019-03-01 MED ORDER — OXYCODONE HCL 5 MG/5ML PO SOLN: 5.0000 mg | Freq: Once | ORAL | Status: DC | PRN

## 2019-03-01 MED ORDER — OXYTOCIN 40 UNITS IN NORMAL SALINE INFUSION - SIMPLE MED
INTRAVENOUS | Status: AC
  Filled 2019-03-01: qty 1000

## 2019-03-01 MED ORDER — MORPHINE SULFATE (PF) 0.5 MG/ML IJ SOLN
INTRAMUSCULAR | Status: DC | PRN
  Administered 2019-03-01: .15 mg via INTRATHECAL

## 2019-03-01 MED ORDER — OXYCODONE HCL 5 MG PO TABS: 5.0000 mg | ORAL_TABLET | Freq: Once | ORAL | Status: DC | PRN

## 2019-03-01 MED ORDER — MENTHOL 3 MG MT LOZG: 1.0000 | LOZENGE | OROMUCOSAL | Status: DC | PRN

## 2019-03-01 MED ORDER — PHENYLEPHRINE HCL (PRESSORS) 10 MG/ML IV SOLN
INTRAVENOUS | Status: DC | PRN
  Administered 2019-03-01 (×3): 80 ug via INTRAVENOUS

## 2019-03-01 MED ORDER — ONDANSETRON HCL 4 MG/2ML IJ SOLN
INTRAMUSCULAR | Status: DC | PRN
  Administered 2019-03-01: 4 mg via INTRAVENOUS

## 2019-03-01 MED ORDER — PHENYLEPHRINE HCL-NACL 20-0.9 MG/250ML-% IV SOLN
INTRAVENOUS | Status: DC | PRN
  Administered 2019-03-01: 60 ug/min via INTRAVENOUS

## 2019-03-01 MED ORDER — FENTANYL CITRATE (PF) 100 MCG/2ML IJ SOLN
INTRAMUSCULAR | Status: AC
  Filled 2019-03-01: qty 2

## 2019-03-01 MED ORDER — SENNOSIDES-DOCUSATE SODIUM 8.6-50 MG PO TABS
2.0000 | ORAL_TABLET | ORAL | Status: DC
  Administered 2019-03-02 (×2): 2 via ORAL
  Filled 2019-03-01 (×2): qty 2

## 2019-03-01 MED ORDER — ALBUTEROL SULFATE (2.5 MG/3ML) 0.083% IN NEBU: 2.5000 mg | INHALATION_SOLUTION | RESPIRATORY_TRACT | Status: DC | PRN

## 2019-03-01 MED ORDER — COCONUT OIL OIL
1.0000 "application " | TOPICAL_OIL | Status: DC | PRN
  Administered 2019-03-02: 1 via TOPICAL

## 2019-03-01 MED ORDER — HYDROMORPHONE HCL 1 MG/ML IJ SOLN
0.2500 mg | INTRAMUSCULAR | Status: DC | PRN
  Administered 2019-03-01 (×2): 0.5 mg via INTRAVENOUS

## 2019-03-01 MED ORDER — FENTANYL CITRATE (PF) 100 MCG/2ML IJ SOLN
INTRAMUSCULAR | Status: DC | PRN
  Administered 2019-03-01: 15 ug via INTRATHECAL

## 2019-03-01 MED ORDER — ZOLPIDEM TARTRATE 5 MG PO TABS: 5.0000 mg | ORAL_TABLET | Freq: Every evening | ORAL | Status: DC | PRN

## 2019-03-01 MED ORDER — DIPHENHYDRAMINE HCL 25 MG PO CAPS: 25.0000 mg | ORAL_CAPSULE | Freq: Four times a day (QID) | ORAL | Status: DC | PRN

## 2019-03-01 MED ORDER — DEXTROSE 5 % IV SOLN
INTRAVENOUS | Status: AC
  Filled 2019-03-01: qty 3000

## 2019-03-01 MED ORDER — WITCH HAZEL-GLYCERIN EX PADS: 1.0000 "application " | MEDICATED_PAD | CUTANEOUS | Status: DC | PRN

## 2019-03-01 MED ORDER — LABETALOL HCL 100 MG PO TABS: 50.0000 mg | ORAL_TABLET | Freq: Two times a day (BID) | ORAL | Status: DC

## 2019-03-01 MED ORDER — METOCLOPRAMIDE HCL 5 MG/ML IJ SOLN
INTRAMUSCULAR | Status: AC
  Filled 2019-03-01: qty 2

## 2019-03-01 MED ORDER — DIBUCAINE (PERIANAL) 1 % EX OINT: 1.0000 "application " | TOPICAL_OINTMENT | CUTANEOUS | Status: DC | PRN

## 2019-03-01 MED ORDER — MORPHINE SULFATE (PF) 0.5 MG/ML IJ SOLN
INTRAMUSCULAR | Status: AC
  Filled 2019-03-01: qty 10

## 2019-03-01 MED ORDER — SODIUM CHLORIDE 0.9% FLUSH: 3.0000 mL | INTRAVENOUS | Status: DC | PRN

## 2019-03-01 MED ORDER — SODIUM CHLORIDE 0.9 % IV SOLN
INTRAVENOUS | Status: DC | PRN
  Administered 2019-03-01: 08:00:00 via INTRAVENOUS

## 2019-03-01 MED ORDER — SIMETHICONE 80 MG PO CHEW
80.0000 mg | CHEWABLE_TABLET | ORAL | Status: DC | PRN
  Administered 2019-03-02: 80 mg via ORAL
  Filled 2019-03-01: qty 1

## 2019-03-01 MED ORDER — SODIUM CHLORIDE 0.9 % IV SOLN
INTRAVENOUS | Status: DC | PRN
  Administered 2019-03-01: 40 [IU] via INTRAVENOUS

## 2019-03-01 MED ORDER — AMMONIA AROMATIC IN INHA
RESPIRATORY_TRACT | Status: AC
  Filled 2019-03-01: qty 30

## 2019-03-01 MED ORDER — OXYCODONE HCL 5 MG PO TABS: 5.0000 mg | ORAL_TABLET | ORAL | Status: DC | PRN

## 2019-03-01 MED ORDER — DEXTROSE 5 % IV SOLN
3.0000 g | INTRAVENOUS | Status: AC
  Administered 2019-03-01: 3 g via INTRAVENOUS
  Filled 2019-03-01: qty 3000

## 2019-03-01 MED ORDER — SOD CITRATE-CITRIC ACID 500-334 MG/5ML PO SOLN
30.0000 mL | Freq: Once | ORAL | Status: AC
  Administered 2019-03-01: 30 mL via ORAL

## 2019-03-01 MED ORDER — LABETALOL HCL 100 MG PO TABS
50.0000 mg | ORAL_TABLET | Freq: Two times a day (BID) | ORAL | Status: DC
  Administered 2019-03-01 – 2019-03-03 (×4): 50 mg via ORAL
  Filled 2019-03-01 (×4): qty 1

## 2019-03-01 MED ORDER — SCOPOLAMINE 1 MG/3DAYS TD PT72
1.0000 | MEDICATED_PATCH | Freq: Once | TRANSDERMAL | Status: DC
  Administered 2019-03-01: 1.5 mg via TRANSDERMAL

## 2019-03-01 MED ORDER — OXYTOCIN 40 UNITS IN NORMAL SALINE INFUSION - SIMPLE MED: 2.5000 [IU]/h | INTRAVENOUS | Status: AC

## 2019-03-01 MED ORDER — SIMETHICONE 80 MG PO CHEW
80.0000 mg | CHEWABLE_TABLET | Freq: Three times a day (TID) | ORAL | Status: DC
  Administered 2019-03-01 – 2019-03-03 (×7): 80 mg via ORAL
  Filled 2019-03-01 (×7): qty 1

## 2019-03-01 SURGICAL SUPPLY — 39 items
BENZOIN TINCTURE PRP APPL 2/3 (GAUZE/BANDAGES/DRESSINGS) IMPLANT
CHLORAPREP W/TINT 26ML (MISCELLANEOUS) ×2 IMPLANT
CLAMP CORD UMBIL (MISCELLANEOUS) IMPLANT
CLOTH BEACON ORANGE TIMEOUT ST (SAFETY) ×2 IMPLANT
DRESSING PREVENA PLUS CUSTOM (GAUZE/BANDAGES/DRESSINGS) IMPLANT
DRSG OPSITE POSTOP 4X10 (GAUZE/BANDAGES/DRESSINGS) ×2 IMPLANT
DRSG PREVENA PLUS CUSTOM (GAUZE/BANDAGES/DRESSINGS) ×2
ELECT REM PT RETURN 9FT ADLT (ELECTROSURGICAL) ×2
ELECTRODE REM PT RTRN 9FT ADLT (ELECTROSURGICAL) ×1 IMPLANT
EXTRACTOR VACUUM KIWI (MISCELLANEOUS) IMPLANT
GLOVE BIO SURGEON STRL SZ 6.5 (GLOVE) ×2 IMPLANT
GLOVE BIOGEL PI IND STRL 7.0 (GLOVE) ×1 IMPLANT
GLOVE BIOGEL PI INDICATOR 7.0 (GLOVE) ×1
GOWN STRL REUS W/TWL LRG LVL3 (GOWN DISPOSABLE) ×4 IMPLANT
KIT ABG SYR 3ML LUER SLIP (SYRINGE) IMPLANT
NDL HYPO 25X5/8 SAFETYGLIDE (NEEDLE) IMPLANT
NEEDLE HYPO 25X5/8 SAFETYGLIDE (NEEDLE) IMPLANT
NS IRRIG 1000ML POUR BTL (IV SOLUTION) ×2 IMPLANT
PACK C SECTION WH (CUSTOM PROCEDURE TRAY) ×2 IMPLANT
PAD OB MATERNITY 4.3X12.25 (PERSONAL CARE ITEMS) ×2 IMPLANT
PENCIL SMOKE EVAC W/HOLSTER (ELECTROSURGICAL) ×2 IMPLANT
RETAINER VISCERAL (MISCELLANEOUS) ×1 IMPLANT
RTRCTR C-SECT PINK 25CM LRG (MISCELLANEOUS) ×2 IMPLANT
STRIP CLOSURE SKIN 1/2X4 (GAUZE/BANDAGES/DRESSINGS) IMPLANT
SUT CHROMIC 1 CTX 36 (SUTURE) ×4 IMPLANT
SUT PLAIN 0 NONE (SUTURE) IMPLANT
SUT PLAIN 2 0 XLH (SUTURE) ×2 IMPLANT
SUT VIC AB 0 CT1 27 (SUTURE) ×2
SUT VIC AB 0 CT1 27XBRD ANBCTR (SUTURE) ×2 IMPLANT
SUT VIC AB 2-0 CT1 27 (SUTURE) ×2
SUT VIC AB 2-0 CT1 TAPERPNT 27 (SUTURE) ×1 IMPLANT
SUT VIC AB 3-0 CT1 27 (SUTURE)
SUT VIC AB 3-0 CT1 TAPERPNT 27 (SUTURE) IMPLANT
SUT VIC AB 3-0 SH 27 (SUTURE) ×1
SUT VIC AB 3-0 SH 27X BRD (SUTURE) IMPLANT
SUT VIC AB 4-0 KS 27 (SUTURE) ×2 IMPLANT
TOWEL OR 17X24 6PK STRL BLUE (TOWEL DISPOSABLE) ×2 IMPLANT
TRAY FOLEY W/BAG SLVR 14FR LF (SET/KITS/TRAYS/PACK) ×2 IMPLANT
WATER STERILE IRR 1000ML POUR (IV SOLUTION) ×2 IMPLANT

## 2019-03-01 NOTE — Op Note (Signed)
Operative Note    Preoperative Diagnosis Term pregnancy at 39 0/7 weeks Persistent Breech Lie Gestational Diabetes Morbid Obesity  Postoperative Diagnosis same  Procedure Primary low transverse c-section with 2 layer closure of uterus  Surgeon Paula Compton, MD Carlynn Purl, DO  Anesthesia Spinal  Fluids: EBL 370mL UOP 147ml concentrated clear urine IVF 2235mL LR  Findings A viable female infant in the frank breech presentation. Apgars 8,9 Weight pending.  Double nuchal cord reduced.  Normal uterus ovaries and tubes  Specimen Placenta to L&D  Procedure Note  Patient was taken to the operating room where spinal anesthesia was obtained and  found to be adequate by Allis clamp test. She was prepped and draped in the normal sterile fashion in the dorsal supine position with a leftward tilt. An appropriate time out was performed. A Pfannenstiel skin incision was then made with the scalpel and carried through to the underlying layer of fascia by sharp dissection and Bovie cautery. The fascia was nicked in the midline and the incision was extended laterally with Mayo scissors. The inferior aspect of the incision was grasped Coker clamps and dissected off the underlying rectus muscles. In a similar fashion the superior aspect was dissected off the rectus muscles. Rectus muscles were separated in the midline and the peritoneal cavity entered bluntly. The peritoneal incision was then extended both superiorly and inferiorly with careful attention to avoid both bowel and bladder. The Alexis self-retaining wound retractor was then placed within the incision and the lower uterine segment exposed. The bladder flap was developed with Metzenbaum scissors and pushed away from the lower uterine segment. The lower uterine segment was then incised in a transverse fashion and the cavity itself entered bluntly. The incision was extended bluntly. The infant's bottom was then lifted and delivered from  the incision without difficulty. The legs and arms were reduced and the head delivered in a flexed position.  The remainder of the infant delivered and the nose and mouth bulb suctioned with the cord clamped and cut as well after delayed cord clamping.. The infant was handed off to the waiting pediatricians. The placenta was then spontaneously expressed from the uterus and the uterus cleared of all clots and debris with moist lap sponge. The uterine incision was then repaired in 2 layers the first layer was a running locked layer 1-0 chromic and the second an imbricating layer of the same suture. The tubes and ovaries were inspected and the gutters cleared of all clots and debris. The uterine incision was inspected and found to be hemostatic. All instruments and sponges as well as the Alexis retractor were then removed from the abdomen. The rectus muscles and peritoneum were then reapproximated with a running suture of 2-0 Vicryl. The fascia was then closed with 0 Vicryl in a running fashion. Subcutaneous tissue was reapproximated with 3-0 plain in a running fashion. The skin was closed with a subcuticular stitch of 4-0 Vicryl on a Keith needle.  The prevena wound vac dressing was applied and put to suction.   At the conclusion of the procedure all instruments and sponge counts were correct. Patient was taken to the recovery room in good condition with her baby accompanying her skin to skin.

## 2019-03-01 NOTE — Progress Notes (Signed)
Patient ID: Isabella Guerrero, female   DOB: 20-Sep-1990, 28 y.o.   MRN: [id number] Per patient no changes in dictated H&P.  Brief exam WNL.  FBS 116. Ready to proceed.

## 2019-03-01 NOTE — Transfer of Care (Signed)
Immediate Anesthesia Transfer of Care Note  Patient: Isabella Guerrero  Procedure(s) Performed: CESAREAN SECTION (N/A )  Patient Location: PACU  Anesthesia Type:Spinal  Level of Consciousness: awake, alert  and patient cooperative  Airway & Oxygen Therapy: Patient Spontanous Breathing  Post-op Assessment: Report given to RN and Post -op Vital signs reviewed and stable  Post vital signs: Reviewed and stable  Last Vitals:  Vitals Value Taken Time  BP 142/80 03/01/19 0916  Temp    Pulse 83 03/01/19 0920  Resp 22 03/01/19 0920  SpO2 98 % 03/01/19 0920  Vitals shown include unvalidated device data.  Last Pain:  Vitals:   03/01/19 0527  TempSrc:   PainSc: 0-No pain         Complications: No apparent anesthesia complications

## 2019-03-01 NOTE — Anesthesia Procedure Notes (Signed)
Spinal  Patient location during procedure: OR Start time: 03/01/2019 7:35 AM End time: 03/01/2019 7:45 AM Staffing Anesthesiologist: Murvin Natal, MD Performed: anesthesiologist  Preanesthetic Checklist Completed: patient identified, surgical consent, pre-op evaluation, timeout performed, IV checked, risks and benefits discussed and monitors and equipment checked Spinal Block Patient position: sitting Prep: DuraPrep Patient monitoring: cardiac monitor, continuous pulse ox and blood pressure Approach: midline Location: L4-5 Injection technique: single-shot Needle Needle type: Pencan  Needle gauge: 24 G Needle length: 9 cm Assessment Sensory level: T10 Additional Notes Functioning IV was confirmed and monitors were applied. Sterile prep and drape, including hand hygiene and sterile gloves were used. The patient was positioned and the spine was prepped. The skin was anesthetized with lidocaine.  Free flow of clear CSF was obtained prior to injecting local anesthetic into the CSF.  The spinal needle aspirated freely following injection.  The needle was carefully withdrawn.  The patient tolerated the procedure well.

## 2019-03-01 NOTE — Anesthesia Postprocedure Evaluation (Signed)
Anesthesia Post Note  Patient: Isabella Guerrero  Procedure(s) Performed: CESAREAN SECTION (N/A )     Patient location during evaluation: PACU Anesthesia Type: Spinal Level of consciousness: oriented and awake and alert Pain management: pain level controlled Vital Signs Assessment: post-procedure vital signs reviewed and stable Respiratory status: spontaneous breathing, respiratory function stable and patient connected to nasal cannula oxygen Cardiovascular status: blood pressure returned to baseline and stable Postop Assessment: no headache, no backache and no apparent nausea or vomiting Anesthetic complications: no    Last Vitals:  Vitals:   03/01/19 1230 03/01/19 1330  BP: 129/85 137/81  Pulse: 75 75  Resp: 18 18  Temp: (!) 36.4 C 36.6 C  SpO2:  97%    Last Pain:  Vitals:   03/01/19 1330  TempSrc:   PainSc: 2    Pain Goal: Patients Stated Pain Goal: 3 (03/01/19 1330)                 Diantha Paxson P Liddy Deam

## 2019-03-01 NOTE — Progress Notes (Signed)
Called Dr Terri Piedra reviewed BP currently 141/ 86, orthostatic elevated  BP's reviewed. Pt had Labetalol 50 mg at 0345 this morning. She  refused 1100 AM dose, concerned BP would be too low(Dr Richardson)was aware. New order received to adjust Labetalol 50mg  BID PO time adjustment to 0500 & 1700.

## 2019-03-01 NOTE — Lactation Note (Signed)
This note was copied from a baby's chart. Lactation Consultation Note  Patient Name: Isabella Guerrero Date: 03/01/2019 Reason for consult: Initial assessment;Maternal endocrine disorder;1st time breastfeeding;Primapara;Term Type of Endocrine Disorder?: Diabetes(GDM)  39 hours old FT female who is being exclusively BF by her mother, she's a P1. Mom reported (+) breast changes during the pregnancy. When [facility] assisted with hand expression noticed that her tissue is not very compressible and her nipples are short shafted, RN Gwinda Passe has been very proactive and already set her up with hand pump, but mom hasn't started using it yet, encouraged her to do so. Mom able to get a small droplet of colostrum out of the left breast, LC showed mom how to finger feed baby.  Offered assistance with latch and mom agreed to have baby STS, LC changed baby's diaper first and then take her to the left breast in football position but baby wouldn't latch, she kept crying and trying to hold into the nipple. Mom said she wanted to be "independent' when BF so she can do it at home, praised her for efforts. However due to mom's body type, nipples are folded underneath the breast fold when at rest and it's very difficult for baby to have a latch if mom is not getting assistance, she's morbidly obese.   LC also tried cross cradle hold, this seemed to work better for baby, she latched briefly but again, as soon as LC stopped holding the breast/nipple into her mouth, it will slip out, baby kept moving and pulling the nipple out of her mouth even with assistance, at attempt was documented in flowsheets. Reviewed normal newborn behavior, feeding cues and cluster feeding.  Feeding plan:  1. Encouraged mom to feed baby STS 8-12 times/24 hours or sooner if feeding cues are present 2. Hand expression/pre pumping prior feeding were also strongly encouraged  BF brochure, BF resources and feeding diary were reviewed. Mom reported all  questions and concerns were answered, she's aware of [facility] services and will call PRN.  Maternal Data Formula Feeding for Exclusion: No Has patient been taught Hand Expression?: Yes Does the patient have breastfeeding experience prior to this delivery?: No  Feeding Feeding Type: Breast Fed  LATCH Score Latch: Grasps breast easily, tongue down, lips flanged, rhythmical sucking.  Audible Swallowing: A few with stimulation  Type of Nipple: Everted at rest and after stimulation  Comfort (Breast/Nipple): Soft / non-tender  Hold (Positioning): Full assist, staff holds infant at breast  LATCH Score: 7  Interventions Interventions: Breast feeding basics reviewed;Assisted with latch;Skin to skin;Breast massage;Hand express;Breast compression;Adjust position;Support pillows;Position options;Hand pump  Lactation Tools Discussed/Used Tools: Pump Breast pump type: Manual WIC Program: No Pump Review: Setup, frequency, and cleaning Initiated by:: RN Betsy Date initiated:: 03/01/19   Consult Status Consult Status: Follow-up Date: 03/02/19 Follow-up type: In-patient    Malloree Raboin Francene Boyers 03/01/2019, 6:24 PM

## 2019-03-02 ENCOUNTER — Encounter (HOSPITAL_COMMUNITY): Payer: Self-pay | Admitting: Obstetrics and Gynecology

## 2019-03-02 LAB — CBC
HCT: 26.7 % — ABNORMAL LOW (ref 36.0–46.0)
Hemoglobin: 8.5 g/dL — ABNORMAL LOW (ref 12.0–15.0)
MCH: 27.9 pg (ref 26.0–34.0)
MCHC: 31.8 g/dL (ref 30.0–36.0)
MCV: 87.5 fL (ref 80.0–100.0)
Platelets: 304 10*3/uL (ref 150–400)
RBC: 3.05 MIL/uL — ABNORMAL LOW (ref 3.87–5.11)
RDW: 12.9 % (ref 11.5–15.5)
WBC: 14.1 10*3/uL — ABNORMAL HIGH (ref 4.0–10.5)
nRBC: 0 % (ref 0.0–0.2)

## 2019-03-02 LAB — RPR: RPR Ser Ql: NONREACTIVE

## 2019-03-02 LAB — BIRTH TISSUE RECOVERY COLLECTION (PLACENTA DONATION)

## 2019-03-02 MED ORDER — RHO D IMMUNE GLOBULIN 1500 UNIT/2ML IJ SOSY
300.0000 ug | PREFILLED_SYRINGE | Freq: Once | INTRAMUSCULAR | Status: AC
  Administered 2019-03-02: 300 ug via INTRAVENOUS
  Filled 2019-03-02: qty 2

## 2019-03-02 NOTE — Progress Notes (Signed)
Subjective: Postpartum Day 1: Cesarean Delivery Patient reports incisional pain and tolerating PO.  Pain controlled, nl lochia  Objective: Vital signs in last 24 hours: Temp:  [97.5 F (36.4 C)-98.4 F (36.9 C)] 98 F (36.7 C) (07/23 7001) Pulse Rate:  [72-88] 82 (07/23 0611) Resp:  [9-24] 18 (07/23 0611) BP: (111-147)/(60-100) 133/76 (07/23 0611) SpO2:  [94 %-100 %] 97 % (07/23 7494)  Physical Exam:  General: alert and no distress Lochia: appropriate Uterine Fundus: firm Incision: healing well DVT Evaluation: No evidence of DVT seen on physical exam.  Recent Labs    03/01/19 0525 03/02/19 0446  HGB 12.1 8.5*  HCT 38.5 26.7*    Assessment/Plan: Status post Cesarean section. Doing well postoperatively.  Continue current care.  Craven Crean Bovard-Stuckert 03/02/2019, 7:42 AM

## 2019-03-02 NOTE — Lactation Note (Addendum)
This note was copied from a baby's chart. Lactation Consultation Note  Patient Name: Isabella Guerrero Date: 03/02/2019 Reason for consult: Mother's request;Difficult latch;1st time breastfeeding;Maternal endocrine disorder;Term;Infant weight loss Type of Endocrine Disorder?: Diabetes P1, female infant 50 hours old, Mom with GDM and wide spaced V-shaped breast that are semi-short shafted. Mom hand expressed and small amounts of colostrum present. Infant is clustered feeding and mom current feeding choice is breast and formula by curve tip syringe.  LC readjusted infant at breast when mom using football hold, infant face and nose turned to close mom's breast, LC explained nose and chin should only touch breast and nostrils should be seen. Infant breast feed in rthymitic pattern after repeated attempts, few swallows observed and infant supplemented at breast with curve tip syringe taking 7 ml of Enfamil with iron 20 kcal. Infant breastfeeding for 10 minutes at breast while being supplement with formula using a  curve tip syringe at the breast. Mom is aware of formula risk LEAD. Mom will breastfeed according hunger cues, 8 top 12 times within 24 hours and on demand. Mom understand that infant is cluster feeding at this time. Infant given additional 5 ml of formula on gloved finger with curve tip syringe. Mom has information regarding breast supplementation based on infant age/ hours of life. Mom will start supplementing breastfeeding with formula. Mom plans to give infant back any EBM first after breastfeeding infant before offering formula. Mom explained how to use DEBP and will pump every 3 hours for 15 minutes to help with breast stimulation and induction.  Mom shown how to use DEBP & how to disassemble, clean, & reassemble parts.  Maternal Data    Feeding Feeding Type: Breast Fed  LATCH Score Latch: Repeated attempts needed to sustain latch, nipple held in mouth throughout  feeding, stimulation needed to elicit sucking reflex.  Audible Swallowing: A few with stimulation  Type of Nipple: Everted at rest and after stimulation  Comfort (Breast/Nipple): Soft / non-tender  Hold (Positioning): Assistance needed to correctly position infant at breast and maintain latch.  LATCH Score: 7  Interventions Interventions: Assisted with latch;Skin to skin;Support pillows;Adjust position;Breast compression;Breast feeding basics reviewed;Position options;Hand express;Expressed milk;DEBP  Lactation Tools Discussed/Used Tools: Pump;46F feeding tube / Syringe Breast pump type: Double-Electric Breast Pump   Consult Status Consult Status: Follow-up Date: 03/03/19 Follow-up type: In-patient    Vicente Serene 03/02/2019, 11:16 PM

## 2019-03-02 NOTE — Lactation Note (Signed)
This note was copied from a baby's chart. Lactation Consultation Note Mom requested assistance w/latching.  Mom states she can only BF to the Rt. Breast d/t Rt. neck pain.  Mom has "V" shaped breast w/everted nipple at the bottom end of breast. Mom states she  Can't BF to the Lt. Breast d/t can't reach her Rt. Arm across to the Lt. Breast.  Mom wanted football hold. Latched well.  Mom states she having trouble latching and positioning d/t pain. Hand expressed colostrum.  LC used hand pump to Lt. Breast for stimulation since baby isn't feeding to that breast. Baby has fed to Lt. Breast since birth, but not recently.  Patient Name: Isabella Guerrero [id number] Date: 03/02/2019 Reason for consult: Mother's request;Difficult latch;1st time breastfeeding Type of Endocrine Disorder?: Diabetes   Maternal Data Has patient been taught Hand Expression?: Yes Does the patient have breastfeeding experience prior to this delivery?: No  Feeding Feeding Type: Breast Fed  LATCH Score Latch: Grasps breast easily, tongue down, lips flanged, rhythmical sucking.  Audible Swallowing: A few with stimulation  Type of Nipple: Everted at rest and after stimulation  Comfort (Breast/Nipple): Soft / non-tender  Hold (Positioning): Assistance needed to correctly position infant at breast and maintain latch.  LATCH Score: 8  Interventions Interventions: Breast feeding basics reviewed;Adjust position;Assisted with latch;Support pillows;Skin to skin;Position options;Breast massage;Hand express;Breast compression;Hand pump  Lactation Tools Discussed/Used Tools: Pump Breast pump type: Manual   Consult Status Consult Status: Follow-up Date: 03/02/19 Follow-up type: In-patient    Kyen Taite, Elta Guadeloupe 03/02/2019, 2:51 AM

## 2019-03-03 LAB — RH IG WORKUP (INCLUDES ABO/RH)
ABO/RH(D): A NEG
Fetal Screen: NEGATIVE
Gestational Age(Wks): 39
Unit division: 0

## 2019-03-03 MED ORDER — IBUPROFEN 800 MG PO TABS: 800.0000 mg | ORAL_TABLET | Freq: Three times a day (TID) | ORAL | 0 refills | Status: DC

## 2019-03-03 MED ORDER — ACETAMINOPHEN 500 MG PO TABS: 1000.0000 mg | ORAL_TABLET | Freq: Four times a day (QID) | ORAL | 0 refills | Status: DC

## 2019-03-03 MED ORDER — OXYCODONE HCL 5 MG PO TABS: 5.0000 mg | ORAL_TABLET | ORAL | 0 refills | Status: DC | PRN

## 2019-03-03 MED FILL — oxyCODONE HCL 5 MG TABS: 5 | 2 days supply | Qty: 15 | Fill #0

## 2019-03-03 MED FILL — IBUPROFEN 800 MG TAB: 800 | 10 days supply | Qty: 30 | Fill #0

## 2019-03-03 NOTE — Lactation Note (Signed)
This note was copied from a baby's chart. Lactation Consultation Note  Patient Name: Isabella Guerrero [id number] Date: 03/03/2019 Reason for consult: Follow-up assessment;Maternal endocrine disorder;1st time breastfeeding;Primapara;Infant weight loss Type of Endocrine Disorder?: Diabetes(GDM)  59 hours old FT female who is now being partially BF and formula fed by her mother, she's a P1. Mom and baby are going home today, baby is at 9.5% weight loss; but already getting supplemented with Enfamil formula via a curve tip syringe. Reviewed discharge instructions, red flags on when to call baby's pediatrician and treatment/prevention for sore nipples. Mom is already pumping with a DEBP and getting drops of EBM; she pumped 3 times last night.  Mom asked for latch assistance she just started feeding baby when entering the room, but noticed that latch was shallow. Repositioned baby for a deeper latch and showed mom the key pointers; advised her to break the latch whenever baby is shallow at the nipple. Baby fed for 10 minutes but only a couple of audible swallows noted, it might be baby's own saliva. Baby would suck in a nice rhythmical patter though with long extensions of her jaw which may indicate transfer. When [facility] assisted with hand expression on the other breast, mom was able to get colostrum easily.  After baby was done with feeding at the breast, LC offered to help parents supplementing, they're doing the curve tip syringe. They already have an appointment with baby's pediatrician tomorrow. LC fed baby 12 ml of Enfamil, parents told [place] that baby should be taking between 30-60 ml of formula per feeding. They're going to offer two more syringes after LC left the room; LC also showed parents how to burp baby.   Feeding plan:  1. Encouraged mom to feed baby STS 8-12 times/24 hours or sooner if feeding cues are present 2. Mom will double pump after feedings and will offer any amount of EBM she may get 3.  Parents will continue supplementing baby with Enfamil formula after feedings at the breast in the volumes required for supplementation according to baby's age in hours.  Parents reported all questions and concerns were answered, they're both aware of [facility] services and will call PRN.  Maternal Data    Feeding Feeding Type: Formula  LATCH Score Latch: Grasps breast easily, tongue down, lips flanged, rhythmical sucking.  Audible Swallowing: None(just two in the beggining, maybe it was baby's own saliva)  Type of Nipple: Everted at rest and after stimulation  Comfort (Breast/Nipple): Soft / non-tender  Hold (Positioning): Assistance needed to correctly position infant at breast and maintain latch.  LATCH Score: 7  Interventions Interventions: Breast feeding basics reviewed;Assisted with latch;Skin to skin;Breast massage;Hand express;Breast compression;Adjust position;Support pillows;Coconut oil  Lactation Tools Discussed/Used Tools: Coconut oil   Consult Status Consult Status: Complete Date: 03/03/19 Follow-up type: Call as needed    Rachard Isidro Francene Boyers 03/03/2019, 2:27 PM

## 2019-03-03 NOTE — Discharge Summary (Signed)
OB Discharge Summary     Patient Name: Isabella Guerrero DOB: Oct 03, 1990 MRN: [id number]  Date of admission: 03/01/2019 Delivering MD: Huel CoteICHARDSON, Shameca Landen   Date of discharge: 03/03/2019  Admitting diagnosis: breech Intrauterine pregnancy: [id number]     Secondary diagnosis:  Active Problems:   Breech presentation   S/P primary low transverse C-section  Additional problems: none     Discharge diagnosis: Term Pregnancy Delivered                                                                                                Post partum procedures:rhogam Complications: None  Hospital course:  Sceduled C/S   28 y.o. yo G1P0 at 6947w2d was admitted to the hospital 03/01/2019 for scheduled cesarean section with the following indication:Malpresentation.  Membrane Rupture Time/Date: 8:13 AM ,03/01/2019   Patient delivered a Viable infant.03/01/2019  Details of operation can be found in separate operative note.  Pateint had an uncomplicated postpartum course.  She is ambulating, tolerating a regular diet, passing flatus, and urinating well. Patient is discharged home in stable condition on  03/03/19         Physical exam  Vitals:   03/02/19 0611 03/02/19 1326 03/02/19 2205 03/03/19 0515  BP: 133/76 133/77 134/73 118/72  Pulse: 82 88 (!) 101 81  Resp: 18 17 12 20   Temp: 98 F (36.7 C) 98.5 F (36.9 C) 98.2 F (36.8 C) 98.3 F (36.8 C)  TempSrc: Oral Oral Oral Oral  SpO2: 97% 100% 98%   Weight:      Height:       General: alert and cooperative Lochia: appropriate Uterine Fundus: firm Incision: Dressing is clean, dry, and intact DVT Evaluation: No evidence of DVT seen on physical exam. Labs: Lab Results  Component Value Date   WBC 14.1 (H) 03/02/2019   HGB 8.5 (L) 03/02/2019   HCT 26.7 (L) 03/02/2019   MCV 87.5 03/02/2019   PLT 304 03/02/2019   CMP Latest Ref Rng & Units 03/01/2019  Glucose 70 - 99 mg/dL 409(W120(H)  BUN 6 - 20 mg/dL 11  Creatinine 1.190.44 - 1.471.00 mg/dL 8.290.63  Sodium 562135 -  130145 mmol/L 136  Potassium 3.5 - 5.1 mmol/L 4.5  Chloride 98 - 111 mmol/L 104  CO2 22 - 32 mmol/L 23  Calcium 8.9 - 10.3 mg/dL 9.4    Discharge instruction: per After Visit Summary and "Baby and Me Booklet".  After visit meds:  Allergies as of 03/03/2019      Reactions   Scallops [shellfish Allergy] Anaphylaxis, Hives   Adhesive [tape] Rash   Latex Rash      Medication List    STOP taking these medications   aspirin 81 MG EC tablet   metoCLOPramide 10 MG tablet Commonly known as: REGLAN   multivitamin-prenatal 27-0.8 MG Tabs tablet   pantoprazole 40 MG tablet Commonly known as: PROTONIX     TAKE these medications   acetaminophen 500 MG tablet Commonly known as: TYLENOL Take 2 tablets (1,000 mg total) by mouth every 6 (six) hours.   albuterol 108 (90 Base) MCG/ACT inhaler Commonly known as:  VENTOLIN HFA Inhale into the lungs every 6 (six) hours as needed for wheezing or shortness of breath.   docusate sodium 100 MG capsule Commonly known as: COLACE Take 100 mg by mouth 2 (two) times daily.   ibuprofen 800 MG tablet Commonly known as: ADVIL Take 1 tablet (800 mg total) by mouth every 8 (eight) hours.   labetalol 100 MG tablet Commonly known as: NORMODYNE Take 50 mg by mouth 2 (two) times daily.   oxyCODONE 5 MG immediate release tablet Commonly known as: Oxy IR/ROXICODONE Take 1-2 tablets (5-10 mg total) by mouth every 4 (four) hours as needed for moderate pain.   PRENATAL+DHA PO Take 1 tablet by mouth daily.       Diet: routine diet  Activity: Advance as tolerated. Pelvic rest for 6 weeks.   Outpatient follow up:1 week Follow up Appt:No future appointments. Follow up Visit:No follow-ups on file.  Postpartum contraception: Nexplanon  Newborn Data: Live born female  Birth Weight: 7 lb 2.6 oz (3250 g) APGAR: 8, 9  Newborn Delivery   Birth date/time: 03/01/2019 08:14:00 Delivery type: C-Section, Low Transverse Trial of labor: No C-section  categorization: Primary      Baby Feeding: Bottle and Breast Disposition:home with mother   03/03/2019 Logan Bores, MD

## 2019-03-03 NOTE — Progress Notes (Signed)
Subjective: Postpartum Day 2: Cesarean Delivery Patient reports tolerating PO, + flatus and no problems voiding.  Pain controlled with ibuprofen  Objective: Vital signs in last 24 hours: Temp:  [98.2 F (36.8 C)-98.5 F (36.9 C)] 98.3 F (36.8 C) (07/24 0515) Pulse Rate:  [81-101] 81 (07/24 0515) Resp:  [12-20] 20 (07/24 0515) BP: (118-134)/(72-77) 118/72 (07/24 0515) SpO2:  [98 %-100 %] 98 % (07/23 2205)  Physical Exam:  General: alert and cooperative Lochia: appropriate Uterine Fundus: firm Incision: C/D/I with prevena wound vac dressing   Recent Labs    03/01/19 0525 03/02/19 0446  HGB 12.1 8.5*  HCT 38.5 26.7*    Assessment/Plan: Status post Cesarean section. Doing well postoperatively.  Discharge home with standard precautions and return to office next week to remove dressing.  Logan Bores 03/03/2019, 9:38 AM

## 2019-03-09 DIAGNOSIS — R309 Painful micturition, unspecified: Secondary | ICD-10-CM | POA: Diagnosis not present

## 2019-03-14 MED FILL — HYDROCHLOROTHIAZIDE 50 MG T: 50 | 30 days supply | Qty: 30 | Fill #0

## 2019-03-20 DIAGNOSIS — O139 Gestational [pregnancy-induced] hypertension without significant proteinuria, unspecified trimester: Secondary | ICD-10-CM | POA: Diagnosis not present

## 2019-03-20 MED FILL — BUTALBITAL-APAP-CAFFEINE 50: 50-300-40 | 5 days supply | Qty: 20 | Fill #0

## 2019-03-21 MED FILL — METOCLOPRAMIDE 10 MG TABLET: 10 | 30 days supply | Qty: 60 | Fill #3

## 2019-03-30 MED FILL — CEPHALEXIN 500 MG CAPSULE: 500 | 7 days supply | Qty: 21 | Fill #0

## 2019-04-07 MED FILL — AMOX-CLAV 875-125 MG TABLET: 875-125 | 7 days supply | Qty: 14 | Fill #0

## 2019-04-10 DIAGNOSIS — Z1389 Encounter for screening for other disorder: Secondary | ICD-10-CM | POA: Diagnosis not present

## 2019-04-10 DIAGNOSIS — Z3009 Encounter for other general counseling and advice on contraception: Secondary | ICD-10-CM | POA: Diagnosis not present

## 2019-04-10 DIAGNOSIS — Z309 Encounter for contraceptive management, unspecified: Secondary | ICD-10-CM | POA: Diagnosis not present

## 2019-04-21 MED FILL — LABETALOL HCL 100MG TABLET: 100 | 30 days supply | Qty: 60 | Fill #4

## 2019-04-26 ENCOUNTER — Ambulatory Visit (INDEPENDENT_AMBULATORY_CARE_PROVIDER_SITE_OTHER): Payer: 59 | Admitting: Family Medicine

## 2019-04-26 ENCOUNTER — Encounter: Payer: Self-pay | Admitting: Family Medicine

## 2019-04-26 ENCOUNTER — Other Ambulatory Visit: Payer: Self-pay

## 2019-04-26 VITALS — BP 122/80 | Ht 65.0 in | Wt 313.0 lb

## 2019-04-26 DIAGNOSIS — O24410 Gestational diabetes mellitus in pregnancy, diet controlled: Secondary | ICD-10-CM

## 2019-04-26 DIAGNOSIS — Z7689 Persons encountering health services in other specified circumstances: Secondary | ICD-10-CM

## 2019-04-26 DIAGNOSIS — I10 Essential (primary) hypertension: Secondary | ICD-10-CM | POA: Diagnosis not present

## 2019-04-26 MED ORDER — LABETALOL HCL 100 MG PO TABS: 100.0000 mg | ORAL_TABLET | Freq: Two times a day (BID) | ORAL | 3 refills | Status: DC

## 2019-04-26 NOTE — Progress Notes (Signed)
Virtual Visit via Video Note  I connected with Isabella Guerrero on 88[birth year] at  2:00 PM EDT by a video enabled telemedicine application and verified that I am speaking with the correct person using two identifiers. Location patient: home Location provider:  home office Persons participating in the virtual visit: patient, provider  I discussed the limitations of evaluation and management by telemedicine and the availability of in person appointments. The patient expressed understanding and agreed to proceed.  Chief Complaint  Patient presents with  . Establish Care    est care--BP meds consult--     HPI: Isabella Guerrero is a 28 y.o. female to establish care with our office. She has a h/o HTN and gestational diabetes. Pt recently gave birth in 02/2019, daughter Isabella Guerrero. Pt is on labetalol 100mg  BID. She has less than a 1 mo supply left. Pre-pregnancy she was on coreg.  Pt also with gestational DM, she states A1C = 5.8. diet-controlled. BS have been WNL since delivery.   Past Medical History:  Diagnosis Date  . GDM (gestational diabetes mellitus) 12/2018  . Gestational diabetes   . Hypertension     Past Surgical History:  Procedure Laterality Date  . CESAREAN SECTION N/A 03/01/2019   Procedure: CESAREAN SECTION;  Surgeon: Paula Compton, MD;  Location: [facility];  Service: Obstetrics;  Laterality: N/A;  need extra 35mins in OR and request RNFA  . DRUG INDUCED ENDOSCOPY    . FINGER SURGERY    . TONSILLECTOMY    . WISDOM TOOTH EXTRACTION      Family History  Problem Relation Age of Onset  . Diabetes Mother   . Hypertension Mother   . Heart disease Mother   . Hypertension Father   . Esophageal cancer Maternal Uncle   . Colon cancer Maternal Grandmother     Social History   Tobacco Use  . Smoking status: Former Smoker    Types: Cigarettes    Quit date: 07/08/2018    Years since quitting: 0.8  . Smokeless tobacco: Never Used  Substance Use Topics  . Alcohol use: Yes     Alcohol/week: 1.0 standard drinks    Types: 1 Cans of beer per week    Comment: once in a while  . Drug use: Never     Current Outpatient Medications:  .  acetaminophen (TYLENOL) 500 MG tablet, Take 2 tablets (1,000 mg total) by mouth every 6 (six) hours., Disp: 30 tablet, Rfl: 0 .  albuterol (VENTOLIN HFA) 108 (90 Base) MCG/ACT inhaler, Inhale into the lungs every 6 (six) hours as needed for wheezing or shortness of breath., Disp: , Rfl:  .  ibuprofen (ADVIL) 800 MG tablet, Take 1 tablet (800 mg total) by mouth every 8 (eight) hours., Disp: 30 tablet, Rfl: 0 .  labetalol (NORMODYNE) 100 MG tablet, Take 50 mg by mouth 2 (two) times daily., Disp: , Rfl:  .  docusate sodium (COLACE) 100 MG capsule, Take 100 mg by mouth 2 (two) times daily., Disp: , Rfl:  .  oxyCODONE (OXY IR/ROXICODONE) 5 MG immediate release tablet, Take 1-2 tablets (5-10 mg total) by mouth every 4 (four) hours as needed for moderate pain. (Patient not taking: Reported on 04/26/2019), Disp: 15 tablet, Rfl: 0 .  Prenatal MV-Min-Fe Fum-FA-DHA (PRENATAL+DHA PO), Take 1 tablet by mouth daily., Disp: , Rfl:   Allergies  Allergen Reactions  . Scallops [Shellfish Allergy] Anaphylaxis and Hives  . Adhesive [Tape] Rash  . Latex Rash      ROS: See  pertinent positives and negatives per HPI.   EXAM:  VITALS per patient if applicable: BP 122/80 Comment: pt report  Ht 5\' 5"  (1.651 m)   Wt (!) 313 lb (142 kg) Comment: pt report  LMP 06/01/2018   BMI 52.09 kg/m    GENERAL: alert, oriented, appears well and in no acute distress  HEENT: atraumatic, conjunctiva clear, no obvious abnormalities on inspection of external nose and ears  NECK: normal movements of the head and neck  LUNGS: on inspection no signs of respiratory distress, breathing rate appears normal, no obvious gross SOB, gasping or wheezing, no conversational dyspnea  CV: no obvious cyanosis  MS: moves all visible extremities without noticeable  abnormality  PSYCH/NEURO: pleasant and cooperative, no obvious depression or anxiety, speech and thought processing grossly intact   ASSESSMENT AND PLAN: 1. Encounter to establish care with new doctor  2. Essential hypertension - well-controlled, at goal - pt would like to cone on current med Refill: - labetalol (NORMODYNE) 100 MG tablet; Take 1 tablet (100 mg total) by mouth 2 (two) times daily.  Dispense: 180 tablet; Refill: 3 - f/u in 4-6 mo or sooner PRN  3. Diet controlled gestational diabetes mellitus (GDM), antepartum - pt reports A1C = 5.8 and BS at goal since delivery - Hemoglobin A1c; Future - will check in 3 mo    I discussed the assessment and treatment plan with the patient. The patient was provided an opportunity to ask questions and all were answered. The patient agreed with the plan and demonstrated an understanding of the instructions.   The patient was advised to call back or seek an in-person evaluation if the symptoms worsen or if the condition fails to improve as anticipated.   Luana ShuMary , DO

## 2019-05-25 ENCOUNTER — Encounter: Payer: Self-pay | Admitting: Family Medicine

## 2019-05-25 ENCOUNTER — Ambulatory Visit (INDEPENDENT_AMBULATORY_CARE_PROVIDER_SITE_OTHER): Payer: 59 | Admitting: Family Medicine

## 2019-05-25 VITALS — Ht 65.0 in

## 2019-05-25 DIAGNOSIS — H00011 Hordeolum externum right upper eyelid: Secondary | ICD-10-CM | POA: Diagnosis not present

## 2019-05-25 MED ORDER — ERYTHROMYCIN 5 MG/GM OP OINT: 1.0000 "application " | TOPICAL_OINTMENT | Freq: Three times a day (TID) | OPHTHALMIC | 0 refills | Status: AC

## 2019-05-25 MED FILL — LABETALOL HCL 100MG TABLET: 100 | 90 days supply | Qty: 180 | Fill #0

## 2019-05-25 MED FILL — ERYTHROMYCIN EYE OINTMENT: 5 | 10 days supply | Qty: 4 | Fill #0

## 2019-05-25 NOTE — Progress Notes (Signed)
Virtual Visit via Video Note  I connected with Isabella Guerrero on 05/25/19 at  4:00 PM EDT by a video enabled telemedicine application and verified that I am speaking with the correct person using two identifiers. Location patient: home Location provider: work  Persons participating in the virtual visit: patient, provider  I discussed the limitations of evaluation and management by telemedicine and the availability of in person appointments. The patient expressed understanding and agreed to proceed.  Chief Complaint  Patient presents with  . Belepharitis    Pt have a swollen painful right eyelid.3days.     HPI: Isabella Guerrero is a 28 y.o. female complains of Rt eyelid redness, tenderness, swelling x 3 days. Pt states symptoms started after working and wearing goggles for a prolonged period of time. She felt sweat ran down from her forehead and into her eye. Eye itself is not red. No vision changes. No fever, chills. Pt notes some clear/white crusting in the AM but no other drainage.   Past Medical History:  Diagnosis Date  . GDM (gestational diabetes mellitus) 12/2018  . Gestational diabetes   . Hypertension     Past Surgical History:  Procedure Laterality Date  . CESAREAN SECTION N/A 03/01/2019   Procedure: CESAREAN SECTION;  Surgeon: Huel Cote, MD;  Location: [facility];  Service: Obstetrics;  Laterality: N/A;  need extra in OR and request RNFA  . DRUG INDUCED ENDOSCOPY    . FINGER SURGERY    . TONSILLECTOMY    . WISDOM TOOTH EXTRACTION      Family History  Problem Relation Age of Onset  . Diabetes Mother   . Hypertension Mother   . Heart disease Mother   . Hypertension Father   . Esophageal cancer Maternal Uncle   . Colon cancer Maternal Grandmother     Social History   Tobacco Use  . Smoking status: Former Smoker    Types: Cigarettes    Quit date: 07/08/2018    Years since quitting: 0.8  . Smokeless tobacco: Never Used  Substance Use Topics  .  Alcohol use: Yes    Alcohol/week: 1.0 standard drinks    Types: 1 Cans of beer per week    Comment: once in a while  . Drug use: Never     Current Outpatient Medications:  .  albuterol (VENTOLIN HFA) 108 (90 Base) MCG/ACT inhaler, Inhale into the lungs every 6 (six) hours as needed for wheezing or shortness of breath., Disp: , Rfl:  .  etonogestrel (NEXPLANON) 68 MG IMPL implant, 1 each by Subdermal route once., Disp: , Rfl:  .  labetalol (NORMODYNE) 100 MG tablet, Take 1 tablet (100 mg total) by mouth 2 (two) times daily., Disp: 180 tablet, Rfl: 3 .  levocetirizine (XYZAL) 5 MG tablet, Take 5 mg by mouth every evening., Disp: , Rfl:  .  Multiple Vitamin (MULTIVITAMIN) tablet, Take 1 tablet by mouth daily., Disp: , Rfl:   Allergies  Allergen Reactions  . Scallops [Shellfish Allergy] Anaphylaxis and Hives  . Adhesive [Tape] Rash  . Latex Rash      ROS: See pertinent positives and negatives per HPI.   EXAM:  VITALS per patient if applicable: Ht 5\' 5"  (1.651 m)   LMP 06/01/2018   BMI 52.09 kg/m    GENERAL: alert, oriented, appears well and in no acute distress  RT EYE: normal conjunctiva, Rt upper eyelid medial aspect appears erythematous and mildly swollen, EOM appear to be intact  NECK: normal movements of  the head and neck  LUNGS: on inspection no Guerrero of respiratory distress, breathing rate appears normal, no obvious gross SOB, gasping or wheezing, no conversational dyspnea  CV: no obvious cyanosis  PSYCH/NEURO: pleasant and cooperative, speech and thought processing grossly intact   ASSESSMENT AND PLAN:  1. Hordeolum externum of right upper eyelid - pt requesting oral antibiotics but I do not feel this is periorbital cellulitis or other type of infx requiring systemic abx - warm compress 2-3x/day - scub eye with diluted baby shampoo Rx: - erythromycin ophthalmic ointment; Place 1 application into the right eye 3 (three) times daily for 10 days.  Dispense: 30  g; Refill: 0 - f/u at any time if symptoms worsen or if no improvement in 4-10 days Discussed plan and reviewed medications with patient, including risks, benefits, and potential side effects. Pt expressed understand. All questions answered.    I discussed the assessment and treatment plan with the patient. The patient was provided an opportunity to ask questions and all were answered. The patient agreed with the plan and demonstrated an understanding of the instructions.   The patient was advised to call back or seek an in-person evaluation if the symptoms worsen or if the condition fails to improve as anticipated.   Letta Median, DO

## 2019-05-26 ENCOUNTER — Telehealth: Payer: Self-pay

## 2019-05-26 NOTE — Telephone Encounter (Signed)
What is the concern re: this Rx? Pt will apply to edge of Rt upper eyelid 3x/day x 7-10 days. Please call pharm to see what needs clarification.

## 2019-05-26 NOTE — Telephone Encounter (Signed)
I called and spoke with pharmacy. They were needing to know if there was suppose to be any refills. Advised 30g tube with no refills. Nothing further needed.

## 2019-05-26 NOTE — Telephone Encounter (Signed)
Copied from [place] [phone]. Topic: General - Other >> May 25, 2019  4:55 PM Pauline Good wrote: Reason for CRM: need to verify prescription for erythromycin ophthalmic ointment before filling

## 2019-05-31 ENCOUNTER — Encounter (HOSPITAL_COMMUNITY): Payer: Self-pay

## 2019-09-21 MED FILL — LABETALOL HCL 100MG TABLET: 100 | 90 days supply | Qty: 180 | Fill #1

## 2019-10-27 ENCOUNTER — Ambulatory Visit (INDEPENDENT_AMBULATORY_CARE_PROVIDER_SITE_OTHER): Payer: 59 | Admitting: Nurse Practitioner

## 2019-10-27 ENCOUNTER — Other Ambulatory Visit: Payer: Self-pay

## 2019-10-27 ENCOUNTER — Encounter: Payer: Self-pay | Admitting: Nurse Practitioner

## 2019-10-27 VITALS — BP 139/81 | HR 88 | Temp 97.8°F | Ht 65.0 in

## 2019-10-27 DIAGNOSIS — J0140 Acute pansinusitis, unspecified: Secondary | ICD-10-CM | POA: Diagnosis not present

## 2019-10-27 MED ORDER — PREDNISONE 20 MG PO TABS: 40.0000 mg | ORAL_TABLET | Freq: Every day | ORAL | 0 refills | Status: DC

## 2019-10-27 MED ORDER — AZITHROMYCIN 250 MG PO TABS: 250.0000 mg | ORAL_TABLET | Freq: Every day | ORAL | 0 refills | Status: DC

## 2019-10-27 MED ORDER — MUCINEX 600 MG PO TB12: 600.0000 mg | ORAL_TABLET | Freq: Two times a day (BID) | ORAL | 0 refills | Status: DC | PRN

## 2019-10-27 MED ORDER — SALINE SPRAY 0.65 % NA SOLN: 1.0000 | NASAL | 0 refills | Status: DC | PRN

## 2019-10-27 MED FILL — AZITHROMYCIN 250 MG TABLET: 250 | 5 days supply | Qty: 6 | Fill #0

## 2019-10-27 MED FILL — predniSONE 20 MG TABS: 20 | 3 days supply | Qty: 6 | Fill #0

## 2019-10-27 NOTE — Patient Instructions (Addendum)
Maintain adequate oral hydration. worknote provided  Sinusitis, Adult Sinusitis is soreness and swelling (inflammation) of your sinuses. Sinuses are hollow spaces in the bones around your face. They are located:  Around your eyes.  In the middle of your forehead.  Behind your nose.  In your cheekbones. Your sinuses and nasal passages are lined with a fluid called mucus. Mucus drains out of your sinuses. Swelling can trap mucus in your sinuses. This lets germs (bacteria, virus, or fungus) grow, which leads to infection. Most of the time, this condition is caused by a virus. What are the causes? This condition is caused by:  Allergies.  Asthma.  Germs.  Things that block your nose or sinuses.  Growths in the nose (nasal polyps).  Chemicals or irritants in the air.  Fungus (rare). What increases the risk? You are more likely to develop this condition if:  You have a weak body defense system (immune system).  You do a lot of swimming or diving.  You use nasal sprays too much.  You smoke. What are the signs or symptoms? The main symptoms of this condition are pain and a feeling of pressure around the sinuses. Other symptoms include:  Stuffy nose (congestion).  Runny nose (drainage).  Swelling and warmth in the sinuses.  Headache.  Toothache.  A cough that may get worse at night.  Mucus that collects in the throat or the back of the nose (postnasal drip).  Being unable to smell and taste.  Being very tired (fatigue).  A fever.  Sore throat.  Bad breath. How is this diagnosed? This condition is diagnosed based on:  Your symptoms.  Your medical history.  A physical exam.  Tests to find out if your condition is short-term (acute) or long-term (chronic). Your doctor may: ? Check your nose for growths (polyps). ? Check your sinuses using a tool that has a light (endoscope). ? Check for allergies or germs. ? Do imaging tests, such as an MRI or CT  scan. How is this treated? Treatment for this condition depends on the cause and whether it is short-term or long-term.  If caused by a virus, your symptoms should go away on their own within 10 days. You may be given medicines to relieve symptoms. They include: ? Medicines that shrink swollen tissue in the nose. ? Medicines that treat allergies (antihistamines). ? A spray that treats swelling of the nostrils. ? Rinses that help get rid of thick mucus in your nose (nasal saline washes).  If caused by bacteria, your doctor may wait to see if you will get better without treatment. You may be given antibiotic medicine if you have: ? A very bad infection. ? A weak body defense system.  If caused by growths in the nose, you may need to have surgery. Follow these instructions at home: Medicines  Take, use, or apply over-the-counter and prescription medicines only as told by your doctor. These may include nasal sprays.  If you were prescribed an antibiotic medicine, take it as told by your doctor. Do not stop taking the antibiotic even if you start to feel better. Hydrate and humidify   Drink enough water to keep your pee (urine) pale yellow.  Use a cool mist humidifier to keep the humidity level in your home above 50%.  Breathe in steam for 10-15 minutes, 3-4 times a day, or as told by your doctor. You can do this in the bathroom while a hot shower is running.  Try not to  spend time in cool or dry air. Rest  Rest as much as you can.  Sleep with your head raised (elevated).  Make sure you get enough sleep each night. General instructions   Put a warm, moist washcloth on your face 3-4 times a day, or as often as told by your doctor. This will help with discomfort.  Wash your hands often with soap and water. If there is no soap and water, use hand sanitizer.  Do not smoke. Avoid being around people who are smoking (secondhand smoke).  Keep all follow-up visits as told by your  doctor. This is important. Contact a doctor if:  You have a fever.  Your symptoms get worse.  Your symptoms do not get better within 10 days. Get help right away if:  You have a very bad headache.  You cannot stop throwing up (vomiting).  You have very bad pain or swelling around your face or eyes.  You have trouble seeing.  You feel confused.  Your neck is stiff.  You have trouble breathing. Summary  Sinusitis is swelling of your sinuses. Sinuses are hollow spaces in the bones around your face.  This condition is caused by tissues in your nose that become inflamed or swollen. This traps germs. These can lead to infection.  If you were prescribed an antibiotic medicine, take it as told by your doctor. Do not stop taking it even if you start to feel better.  Keep all follow-up visits as told by your doctor. This is important. This information is not intended to replace advice given to you by your health care provider. Make sure you discuss any questions you have with your health care provider. Document Revised: 12/27/2017 Document Reviewed: 12/27/2017 Elsevier Patient Education  [place].

## 2019-10-27 NOTE — Progress Notes (Signed)
Virtual Visit via Video Note  I connected with@ on 10/27/19 at  9:00 AM EDT by a video enabled telemedicine application and verified that I am speaking with the correct person using two identifiers.  Location: Patient:Home Provider: Office Participants: patient and provider  I discussed the limitations of evaluation and management by telemedicine and the availability of in person appointments. I also discussed with the patient that there may be a patient responsible charge related to this service. The patient expressed understanding and agreed to proceed.  CC:Pt stated dizziness started yesterday an got worse//Pt states its like syncopy-everything goes black an she lost balance an fell twice//Pt reports BP is stable last reading 128/80 an blood sugar was normal//pt has no means to get vitals but said she would have those at time of visit  History of Present Illness: Dizziness This is a new problem. The current episode started yesterday. The problem occurs intermittently. The problem has been waxing and waning. Associated symptoms include congestion, headaches and vertigo. Pertinent negatives include no abdominal pain, anorexia, arthralgias, change in bowel habit, chest pain, chills, coughing, diaphoresis, fatigue, fever, joint swelling, myalgias, nausea, neck pain, numbness, rash, sore throat, swollen glands, urinary symptoms, visual change, vomiting or weakness. Exacerbated by: sudden head movement. She has tried lying down and rest (antihistamine and sudafed) for the symptoms. The treatment provided mild relief.  Sinusitis This is a recurrent problem. The current episode started more than 1 month ago. The problem has been waxing and waning since onset. There has been no fever. The pain is moderate. Associated symptoms include congestion, headaches and sinus pressure. Pertinent negatives include no chills, coughing, diaphoresis, ear pain, hoarse voice, neck pain, shortness of breath, sneezing,  sore throat or swollen glands. Past treatments include acetaminophen and oral decongestants. The treatment provided mild relief.  denies any syncope She reports near syncope episodes yesterday with sudden head movement (AM) and sitting to standing (PM)  fasting glucose on 88  reports repeat hgb of 12 on 04/2019 by GYN.  Observations/Objective: Physical Exam  Constitutional: She is oriented to person, place, and time.  HENT:  Right Ear: External ear normal.  Left Ear: External ear normal.  Nose: Right sinus exhibits maxillary sinus tenderness and frontal sinus tenderness. Left sinus exhibits maxillary sinus tenderness and frontal sinus tenderness.  Eyes: Conjunctivae and EOM are normal.  Cardiovascular: Normal rate.  Pulmonary/Chest: Effort normal.  Musculoskeletal:     Cervical back: Normal range of motion and neck supple.  Neurological: She is alert and oriented to person, place, and time.  Psychiatric: She has a normal mood and affect. Her behavior is normal. Thought content normal.  Vitals reviewed.  Assessment and Plan: Isabella Guerrero was seen today for dizziness.  Diagnoses and all orders for this visit:  Acute non-recurrent pansinusitis -     azithromycin (ZITHROMAX Z-PAK) 250 MG tablet; Take 1 tablet (250 mg total) by mouth daily. Take 2tabs on first day, then 1tab once a day till complete -     predniSONE (DELTASONE) 20 MG tablet; Take 2 tablets (40 mg total) by mouth daily with breakfast. -     guaiFENesin (MUCINEX) 600 MG 12 hr tablet; Take 1 tablet (600 mg total) by mouth 2 (two) times daily as needed for cough or to loosen phlegm. -     sodium chloride (OCEAN) 0.65 % SOLN nasal spray; Place 1 spray into both nostrils as needed for congestion.   Follow Up Instructions: See avs   I discussed the assessment and  treatment plan with the patient. The patient was provided an opportunity to ask questions and all were answered. The patient agreed with the plan and demonstrated an  understanding of the instructions.   The patient was advised to call back or seek an in-person evaluation if the symptoms worsen or if the condition fails to improve as anticipated.  Alysia Penna, NP

## 2019-12-05 ENCOUNTER — Ambulatory Visit: Payer: Self-pay

## 2019-12-05 ENCOUNTER — Other Ambulatory Visit: Payer: Self-pay

## 2019-12-05 ENCOUNTER — Other Ambulatory Visit: Payer: Self-pay | Admitting: Occupational Medicine

## 2019-12-05 DIAGNOSIS — M25562 Pain in left knee: Secondary | ICD-10-CM

## 2020-01-03 ENCOUNTER — Ambulatory Visit: Payer: PRIVATE HEALTH INSURANCE | Admitting: Rehabilitative and Restorative Service Providers"

## 2020-01-09 MED FILL — LABETALOL HCL 100MG TABLET: 100 | 90 days supply | Qty: 180 | Fill #2

## 2020-04-11 ENCOUNTER — Encounter: Payer: Self-pay | Admitting: Family Medicine

## 2020-04-11 ENCOUNTER — Telehealth: Payer: Self-pay | Admitting: Family Medicine

## 2020-04-11 ENCOUNTER — Telehealth (INDEPENDENT_AMBULATORY_CARE_PROVIDER_SITE_OTHER): Payer: Medicaid Other | Admitting: Family Medicine

## 2020-04-11 VITALS — BP 112/63 | HR 91 | Ht 65.0 in | Wt 320.0 lb

## 2020-04-11 DIAGNOSIS — J0100 Acute maxillary sinusitis, unspecified: Secondary | ICD-10-CM | POA: Diagnosis not present

## 2020-04-11 DIAGNOSIS — F17200 Nicotine dependence, unspecified, uncomplicated: Secondary | ICD-10-CM | POA: Diagnosis not present

## 2020-04-11 MED ORDER — BUPROPION HCL ER (SMOKING DET) 150 MG PO TB12: ORAL_TABLET | ORAL | 1 refills | Status: DC

## 2020-04-11 MED ORDER — CHANTIX STARTING MONTH PAK 0.5 MG X 11 & 1 MG X 42 PO TABS: ORAL_TABLET | ORAL | 0 refills | Status: DC

## 2020-04-11 MED ORDER — PREDNISONE 20 MG PO TABS: ORAL_TABLET | ORAL | 0 refills | Status: DC

## 2020-04-11 MED ORDER — AZITHROMYCIN 250 MG PO TABS: ORAL_TABLET | ORAL | 0 refills | Status: DC

## 2020-04-11 MED FILL — BUPROPION HCL ER (SMOKING D: 150 | 30 days supply | Qty: 46 | Fill #0

## 2020-04-11 MED FILL — LABETALOL HCL 100 MG TABS: 100 | 90 days supply | Qty: 180 | Fill #3

## 2020-04-11 NOTE — Telephone Encounter (Signed)
Dr. Salena Saner please advise.  Pt said that she is willing to try Wellbutrin at a lower dose at this time since her parents are visiting with her now just in case she has a reaction to it. That way she has someone there and if she does she can let us know.

## 2020-04-11 NOTE — Telephone Encounter (Signed)
It was my understanding that certain lots were voluntarily recalled by manufacturer but upon further reading it looks like the manufacturer pfizer stopped production at least temporarily. wellbutrin (buproprion) is alternative but pt was unable to tolerate previously. We could try this at a lower dose?

## 2020-04-11 NOTE — Telephone Encounter (Signed)
Dr C please advise.  Pt stated the pharmacy told her that Chantrix has been discontinued and not on the market.

## 2020-04-11 NOTE — Telephone Encounter (Signed)
Rx sent to [facility] outpatient pharm Pt can take 150mg  once daily (standard dosing is BID) for a few weeks then if she is tolerating, she can try increasing to BID

## 2020-04-11 NOTE — Progress Notes (Signed)
Virtual Visit via Video Note  I connected with Isabella Guerrero on 04/11/20 at 11:00 AM EDT by a video enabled telemedicine application and verified that I am speaking with the correct person using two identifiers. Location patient: home Location provider: work  Persons participating in the virtual visit: patient, provider  I discussed the limitations of evaluation and management by telemedicine and the availability of in person appointments. The patient expressed understanding and agreed to proceed.  Chief Complaint  Patient presents with  . Dizziness    pt states if she tilts her head or bends over she has a pain in her left ear//pt reports sinus probs and drainage in back of throat//symptoms for last 3 days just getting worse//dried sudafed and clairitin//pt reported she got dizzy an blacked out an had a fall yesterday-no injuries     HPI: Isabella Guerrero is a 29 y.o. female who complains of 3 day h/o increased sinus pressure in face and behind her eyes, nasal congestion, PND, Lt ear pain/fullness. She had an episode of dizziness, tunnel vision, near-syncopal episode yesterday. Pt grabbed counter and sat herself on floor. No cough. No SOB. No runny nose. + sneezing.  No fever, chills. No myalgias. No loss of taste or smell.  Pt hsa had all of above symptoms with exception of dizziness x months, just worse in past few days. She has been taking sudafed x few days and claritin since summer.  She was seen by my colleague Alysia Penna in 10/2019 and dx with pansinusitis.   Pt had quit smoking but restarted. She had side effects in the past with wellbutrin but did tolerate chantix w/o issue. Pt states she just didn't take it regularly and was not committed to quitting at that time. She requests rx for chantix.  Past Medical History:  Diagnosis Date  . GDM (gestational diabetes mellitus) 12/2018  . Gestational diabetes   . Hypertension     Past Surgical History:  Procedure Laterality Date  .  CESAREAN SECTION N/A 03/01/2019   Procedure: CESAREAN SECTION;  Surgeon: Huel Cote, MD;  Location: [facility] LD ORS;  Service: Obstetrics;  Laterality: N/A;  need extra in OR and request RNFA  . DRUG INDUCED ENDOSCOPY    . FINGER SURGERY    . TONSILLECTOMY    . WISDOM TOOTH EXTRACTION      Family History  Problem Relation Age of Onset  . Diabetes Mother   . Hypertension Mother   . Heart disease Mother   . Hypertension Father   . Esophageal cancer Maternal Uncle   . Colon cancer Maternal Grandmother     Social History   Tobacco Use  . Smoking status: Former Smoker    Types: Cigarettes    Quit date: 07/08/2018    Years since quitting: 1.7  . Smokeless tobacco: Never Used  Vaping Use  . Vaping Use: Never used  Substance Use Topics  . Alcohol use: Yes    Alcohol/week: 1.0 standard drink    Types: 1 Cans of beer per week    Comment: once in a while  . Drug use: Never     Current Outpatient Medications:  .  etonogestrel (NEXPLANON) 68 MG IMPL implant, 1 each by Subdermal route once., Disp: , Rfl:  .  labetalol (NORMODYNE) 100 MG tablet, Take 1 tablet (100 mg total) by mouth 2 (two) times daily., Disp: 180 tablet, Rfl: 3  Allergies  Allergen Reactions  . Scallops [Shellfish Allergy] Anaphylaxis and Hives  . Adhesive [Tape]  Rash  . Latex Rash      ROS: See pertinent positives and negatives per HPI.   EXAM:  VITALS per patient if applicable: BP 112/63   Pulse 91   Ht 5\' 5"  (1.651 m)   Wt (!) 320 lb (145.2 kg)   BMI 53.25 kg/m    GENERAL: alert, oriented, in no acute distress  HEENT: atraumatic, conjunctiva clear, no obvious abnormalities on inspection of external nose and ears  NECK: normal movements of the head and neck  LUNGS: on inspection no signs of respiratory distress, breathing rate appears normal, no obvious gross SOB, gasping or wheezing, no conversational dyspnea  CV: no obvious cyanosis  PSYCH/NEURO: pleasant and cooperative, speech  and thought processing grossly intact   ASSESSMENT AND PLAN:  1. Acute non-recurrent maxillary sinusitis - increased water intake - cont claritin, add nasal saline spray 3x/day and daily flonase Rx: - predniSONE (DELTASONE) 20 MG tablet; 3 tabs po x 3 days, 2 tabs po x 3 days, 1 tab po x 3 days, 1/2 tab po x 3 days  Dispense: 20 tablet; Refill: 0 - if symptoms worsen or do not start to improve in 3-4 days, pt to fill and take abx  - azithromycin (ZITHROMAX) 250 MG tablet; 2 tabs po x 1 dose then 1 tab po daily  Dispense: 6 tablet; Refill: 0 - if symptoms recur or do not improve, pt needs in person appt  2. Tobacco use disorder Rx: - varenicline (CHANTIX STARTING MONTH PAK) 0.5 MG X 11 & 1 MG X 42 tablet; Take one 0.5 mg tablet by mouth once daily for 3 days, then increase to one 0.5 mg tablet twice daily for 4 days, then increase to one 1 mg tablet twice daily.  Dispense: 53 tablet; Refill: 0  Discussed plan and reviewed medications with patient, including risks, benefits, and potential side effects. Pt expressed understand. All questions answered.   I discussed the assessment and treatment plan with the patient. The patient was provided an opportunity to ask questions and all were answered. The patient agreed with the plan and demonstrated an understanding of the instructions.   The patient was advised to call back or seek an in-person evaluation if the symptoms worsen or if the condition fails to improve as anticipated.   , DO

## 2020-04-11 NOTE — Telephone Encounter (Signed)
Patient called and stated that she went fill the prescription for Chantix and it is no longer on the market, please advise. CB is [phone]

## 2020-04-12 NOTE — Telephone Encounter (Signed)
Pt was notified and verbally understood to start Wellbutrin 150mg  daily and to let know if she has any problems.

## 2020-04-18 ENCOUNTER — Telehealth: Payer: Self-pay

## 2020-04-18 ENCOUNTER — Telehealth: Payer: Self-pay | Admitting: Family Medicine

## 2020-04-18 NOTE — Telephone Encounter (Signed)
Pt was called and notified and she is aware she needs to go be seen today. Pt stated she can't do it right away and I expressed to her that she needs to be seen ASAP, even if its an urgent care an not the ED, as long as shes seen. Pt said she understood.

## 2020-04-18 NOTE — Telephone Encounter (Signed)
Pt should be seen and evaluated at UC based on symptoms of diaphoresis, dizziness, palpitations

## 2020-04-18 NOTE — Telephone Encounter (Signed)
Dr. Salena Saner please advise.  Patient called and stated that she is experiencing heart palpations and dizziness, she was transferred to triage where she was told to go to ED or call 911, triage nurse called the office her name was Isabella Guerrero and stated that pt was sweating and having heart palpitations and was dizzy and didn't feel well an nurse advised her to go to ED or call 911 and pt stated she is not going to the ER for 5 hours and denied going to get help. Pt stated that she is on day 7 of Prednisone and was feeling better until today an she just wants to know if she should fill her Z pack.

## 2020-04-18 NOTE — Telephone Encounter (Signed)
Patient called and stated that she is experiencing heart palpations and dizziness, transferred caller to the triage nurse for further evaluation

## 2020-06-03 ENCOUNTER — Encounter (INDEPENDENT_AMBULATORY_CARE_PROVIDER_SITE_OTHER): Payer: Self-pay | Admitting: Family Medicine

## 2020-06-03 ENCOUNTER — Ambulatory Visit (INDEPENDENT_AMBULATORY_CARE_PROVIDER_SITE_OTHER): Payer: Medicaid Other | Admitting: Family Medicine

## 2020-06-03 ENCOUNTER — Other Ambulatory Visit: Payer: Self-pay

## 2020-06-03 VITALS — BP 128/87 | HR 85 | Temp 98.6°F | Ht 65.0 in | Wt 344.0 lb

## 2020-06-03 DIAGNOSIS — Z0289 Encounter for other administrative examinations: Secondary | ICD-10-CM

## 2020-06-03 DIAGNOSIS — Z720 Tobacco use: Secondary | ICD-10-CM

## 2020-06-03 DIAGNOSIS — Z1331 Encounter for screening for depression: Secondary | ICD-10-CM | POA: Diagnosis not present

## 2020-06-03 DIAGNOSIS — Z6841 Body Mass Index (BMI) 40.0 and over, adult: Secondary | ICD-10-CM

## 2020-06-03 DIAGNOSIS — R5383 Other fatigue: Secondary | ICD-10-CM | POA: Diagnosis not present

## 2020-06-03 DIAGNOSIS — R7303 Prediabetes: Secondary | ICD-10-CM | POA: Diagnosis not present

## 2020-06-03 DIAGNOSIS — R0602 Shortness of breath: Secondary | ICD-10-CM

## 2020-06-03 DIAGNOSIS — I10 Essential (primary) hypertension: Secondary | ICD-10-CM

## 2020-06-04 LAB — LIPID PANEL WITH LDL/HDL RATIO
Cholesterol, Total: 224 mg/dL — ABNORMAL HIGH (ref 100–199)
HDL: 33 mg/dL — ABNORMAL LOW (ref >39)
LDL Chol Calc (NIH): 149 mg/dL — ABNORMAL HIGH (ref 0–99)
LDL/HDL Ratio: 4.5 ratio — ABNORMAL HIGH (ref 0.0–3.2)
Triglycerides: 228 mg/dL — ABNORMAL HIGH (ref 0–149)
VLDL Cholesterol Cal: 42 mg/dL — ABNORMAL HIGH (ref 5–40)

## 2020-06-04 LAB — CBC WITH DIFFERENTIAL/PLATELET
Basophils Absolute: 0.1 10*3/uL (ref 0.0–0.2)
Basos: 0 %
EOS (ABSOLUTE): 0.2 10*3/uL (ref 0.0–0.4)
Eos: 1 %
Hematocrit: 42.7 % (ref 34.0–46.6)
Hemoglobin: 13.5 g/dL (ref 11.1–15.9)
Immature Grans (Abs): 0 10*3/uL (ref 0.0–0.1)
Immature Granulocytes: 0 %
Lymphocytes Absolute: 3 10*3/uL (ref 0.7–3.1)
Lymphs: 26 %
MCH: 27 pg (ref 26.6–33.0)
MCHC: 31.6 g/dL (ref 31.5–35.7)
MCV: 85 fL (ref 79–97)
Monocytes Absolute: 0.5 10*3/uL (ref 0.1–0.9)
Monocytes: 4 %
Neutrophils Absolute: 8 10*3/uL — ABNORMAL HIGH (ref 1.4–7.0)
Neutrophils: 69 %
Platelets: 334 10*3/uL (ref 150–450)
RBC: 5 x10E6/uL (ref 3.77–5.28)
RDW: 13.6 % (ref 11.7–15.4)
WBC: 11.7 10*3/uL — ABNORMAL HIGH (ref 3.4–10.8)

## 2020-06-04 LAB — T4: T4, Total: 5.7 ug/dL (ref 4.5–12.0)

## 2020-06-04 LAB — HEMOGLOBIN A1C
Est. average glucose Bld gHb Est-mCnc: 140 mg/dL
Hgb A1c MFr Bld: 6.5 % — ABNORMAL HIGH (ref 4.8–5.6)

## 2020-06-04 LAB — COMPREHENSIVE METABOLIC PANEL
ALT: 16 IU/L (ref 0–32)
AST: 18 IU/L (ref 0–40)
Albumin/Globulin Ratio: 1.6 (ref 1.2–2.2)
Albumin: 4.4 g/dL (ref 3.8–4.8)
Alkaline Phosphatase: 78 IU/L (ref 44–121)
BUN/Creatinine Ratio: 17 (ref 9–23)
BUN: 11 mg/dL (ref 6–20)
Bilirubin Total: 0.2 mg/dL (ref 0.0–1.2)
CO2: 28 mmol/L (ref 20–29)
Calcium: 9.6 mg/dL (ref 8.7–10.2)
Chloride: 98 mmol/L (ref 96–106)
Creatinine, Ser: 0.64 mg/dL (ref 0.57–1.00)
GFR calc Af Amer: 138 mL/min/{1.73_m2} (ref >59)
GFR calc non Af Amer: 119 mL/min/{1.73_m2} (ref >59)
Globulin, Total: 2.7 g/dL (ref 1.5–4.5)
Glucose: 81 mg/dL (ref 65–99)
Potassium: 4.8 mmol/L (ref 3.5–5.2)
Sodium: 139 mmol/L (ref 134–144)
Total Protein: 7.1 g/dL (ref 6.0–8.5)

## 2020-06-04 LAB — FOLATE: Folate: 5.2 ng/mL (ref >3.0)

## 2020-06-04 LAB — TSH: TSH: 3 u[IU]/mL (ref 0.450–4.500)

## 2020-06-04 LAB — VITAMIN D 25 HYDROXY (VIT D DEFICIENCY, FRACTURES): Vit D, 25-Hydroxy: 20.8 ng/mL — ABNORMAL LOW (ref 30.0–100.0)

## 2020-06-04 LAB — VITAMIN B12: Vitamin B-12: 336 pg/mL (ref 232–1245)

## 2020-06-04 LAB — INSULIN, RANDOM: INSULIN: 18.4 u[IU]/mL (ref 2.6–24.9)

## 2020-06-04 LAB — T3: T3, Total: 126 ng/dL (ref 71–180)

## 2020-06-10 NOTE — Progress Notes (Signed)
Chief Complaint:   OBESITY Isabella Guerrero (MR# [id number]) is a 29 y.o. female who presents for evaluation and treatment of obesity and related comorbidities. Current BMI is Body mass index is 53.25 kg/m. Isabella Guerrero has been struggling with her weight for many years and has been unsuccessful in either losing weight, maintaining weight loss, or reaching her healthy weight goal.  Isabella Guerrero heard about our clinic from [employer]. Occasionally skipping lunch and or dinner. Breakfast is scrambled eggs with cheese, spinach, and tomatoes with potatoes and 2 pork sausage links, with 2 pieces of toast. Dinner is pasta, sauce. If she eats lunch it is [place], cheese, mayo, lattuce, and 1 bag of buttered popcorn, and 1 cup of pudding.  Isabella Guerrero is currently in the action stage of change and ready to dedicate time achieving and maintaining a healthier weight. Isabella Guerrero is interested in becoming our patient and working on intensive lifestyle modifications including (but not limited to) diet and exercise for weight loss.  Isabella Guerrero's habits were reviewed today and are as follows: she struggles with family and or coworkers weight loss sabotage, her desired weight loss is 164-194 lbs, she has been heavy most of her life, she started gaining weight after she met her partner, her heaviest weight ever was 350 pounds, she has significant food cravings issues, she skips meals frequently, she is trying to follow a vegetarian diet, she is frequently drinking liquids with calories, she frequently makes poor food choices, she has problems with excessive hunger, she frequently eats larger portions than normal, she has binge eating behaviors and she struggles with emotional eating.  Depression Screen Isabella Guerrero's Food and Mood (modified PHQ-9) score was 21.  Depression screen Isabella Guerrero 2/9 06/03/2020  Decreased Interest 3  Down, Depressed, Hopeless 3  PHQ - 2 Score 6  Altered sleeping 3  Tired, decreased energy 3  Change in appetite 0    Feeling bad or failure about yourself  3  Trouble concentrating 0  Moving slowly or fidgety/restless 3  Suicidal thoughts 0  PHQ-9 Score 18   Subjective:   1. Other fatigue Isabella Guerrero denies daytime somnolence and admits to waking up still tired. Patent has a history of symptoms of daytime fatigue and morning headache. Isabella Guerrero generally gets 6 hours of sleep per night, and states that she has difficulty falling asleep. Snoring is present. Apneic episodes are present. Epworth Sleepiness Score is 0. EKG-poor T wave progression, normal sinus rhythm at 88 BPM, abnormal QRS complex II, III, aVF.  2. Shortness of breath on exertion Isabella Guerrero notes increasing shortness of breath with exercising and seems to be worsening over time with weight gain. She notes getting out of breath sooner with activity than she used to. This has not gotten worse recently. Isabella Guerrero denies shortness of breath at rest or orthopnea.  3. Essential hypertension Isabella Guerrero's blood pressure is controlled today on labetolol 100 mg BID. She denies chest pain, chest pressure, or headaches. She was diagnosed 2 years ago.  4. Pre-diabetes Isabella Guerrero's last K2I was 5.8. She has a history of gestational diabetes.  5. Tobacco abuse Isabella Guerrero is attempting to quit smoking. She went from 1.5 pack to 1/4 pack per day.  Assessment/Plan:   1. Other fatigue Isabella Guerrero does feel that her weight is causing her energy to be lower than it should be. Fatigue may be related to obesity, depression or many other causes. Labs will be ordered, and in the meanwhile, Isabella Guerrero will focus on self care including making healthy food choices,  increasing physical activity and focusing on stress reduction.  - Vitamin B12 - Folate - T3 - T4 - TSH - VITAMIN D 25 Hydroxy (Vit-D Deficiency, Fractures)  2. Shortness of breath on exertion Isabella Guerrero does feel that she gets out of breath more easily that she used to when she exercises. Isabella Guerrero's shortness of breath appears to be  obesity related and exercise induced. She has agreed to work on weight loss and gradually increase exercise to treat her exercise induced shortness of breath. Will continue to monitor closely.  - EKG 12-Lead - CBC with Differential/Platelet - Lipid Panel With LDL/HDL Ratio  3. Essential hypertension Isabella Guerrero is working on healthy weight loss and exercise to improve blood pressure control. We will watch for signs of hypotension as she continues her lifestyle modifications. EKG was done, and we will check labs today.  - Comprehensive metabolic panel  4. Pre-diabetes Isabella Guerrero will continue to work on weight loss, exercise, and decreasing simple carbohydrates to help decrease the risk of diabetes. We will check labs today.  - Hemoglobin A1c - Insulin, random  5. Tobacco abuse We will follow up at Isabella Guerrero's next appointment.  6. Depression screening Isabella Guerrero had a positive depression screening. Depression is commonly associated with obesity and often results in emotional eating behaviors. We will monitor this closely and work on CBT to help improve the non-hunger eating patterns. Referral to Psychology may be required if no improvement is seen as she continues in our clinic.  7. Class 3 severe obesity with serious comorbidity and body mass index (BMI) of 50.0 to 59.9 in adult, unspecified obesity type (HCC) Isabella Guerrero is currently in the action stage of change and her goal is to continue with weight loss efforts. I recommend Isabella Guerrero begin the structured treatment plan as follows:  She has agreed to the Category 4 Plan.  Exercise goals: No exercise has been prescribed at this time.   Behavioral modification strategies: increasing lean protein intake, decreasing eating out, better snacking choices, dealing with family or coworker sabotage and planning for success.  She was informed of the importance of frequent follow-up visits to maximize her success with intensive lifestyle modifications for her  multiple health conditions. She was informed we would discuss her lab results at her next visit unless there is a critical issue that needs to be addressed sooner. Isabella Guerrero agreed to keep her next visit at the agreed upon time to discuss these results.  Objective:   Blood pressure 128/87, pulse 85, temperature 98.6 F (37 C), height _0  (1.651 m), SpO2 96 %, unknown if currently breastfeeding. Body mass index is 53.25 kg/m.  EKG: Normal sinus rhythm, rate 88 BPM.  Indirect Calorimeter completed today shows a VO2 of 464 and a REE of 3231.  Her calculated basal metabolic rate is 9562 thus her basal metabolic rate is better than expected.  General: Cooperative, alert, well developed, in no acute distress. HEENT: Conjunctivae and lids unremarkable. Cardiovascular: Regular rhythm.  Lungs: Normal work of breathing. Neurologic: No focal deficits.   Lab Results  Component Value Date   CREATININE 0.64 06/03/2020   BUN 11 06/03/2020   NA 139 06/03/2020   K 4.8 06/03/2020   CL 98 06/03/2020   CO2 28 06/03/2020   Lab Results  Component Value Date   ALT 16 06/03/2020   AST 18 06/03/2020   ALKPHOS 78 06/03/2020   BILITOT 0.2 06/03/2020   Lab Results  Component Value Date   HGBA1C 6.5 (H) 06/03/2020   Lab  Results  Component Value Date   INSULIN 18.4 06/03/2020   Lab Results  Component Value Date   TSH 3.000 06/03/2020   Lab Results  Component Value Date   CHOL 224 (H) 06/03/2020   HDL 33 (L) 06/03/2020   LDLCALC 149 (H) 06/03/2020   TRIG 228 (H) 06/03/2020   Lab Results  Component Value Date   WBC 11.7 (H) 06/03/2020   HGB 13.5 06/03/2020   HCT 42.7 06/03/2020   MCV 85 06/03/2020   PLT 334 06/03/2020   No results found for: IRON, TIBC, FERRITIN  Attestation Statements:   Reviewed by clinician on day of visit: allergies, medications, problem list, medical history, surgical history, family history, social history, and previous encounter notes.  Time spent on visit  including pre-visit chart review and post-visit charting and care was 60 minutes.    I, Trixie Dredge, am acting as transcriptionist for Coralie Common, MD.  This is the patient's first visit at [facility]. The patient's NEW PATIENT PACKET was reviewed at length. Included in the packet: current and past health history, medications, allergies, ROS, gynecologic history (women only), surgical history, family history, social history, weight history, weight loss surgery history (for those that have had weight loss surgery), nutritional evaluation, mood and food questionnaire, PHQ9, Epworth questionnaire, sleep habits questionnaire, patient life and health improvement goals questionnaire. These will all be scanned into the patient's chart under media.   During the visit, I independently reviewed the patient's EKG, bioimpedance scale results, and indirect calorimeter results. I used this information to tailor a meal plan for the patient that will help her to lose weight and will improve her obesity-related conditions going forward. I performed a medically necessary appropriate examination and/or evaluation. I discussed the assessment and treatment plan with the patient. The patient was provided an opportunity to ask questions and all were answered. The patient agreed with the plan and demonstrated an understanding of the instructions. Labs were ordered at this visit and will be reviewed at the next visit unless more critical results need to be addressed immediately. Clinical information was updated and documented in the EMR.   Time spent on visit including pre-visit chart review and post-visit care was 60 minutes.   I have reviewed the above documentation for accuracy and completeness, and I agree with the above. - Jinny Blossom, MD

## 2020-06-17 ENCOUNTER — Ambulatory Visit (INDEPENDENT_AMBULATORY_CARE_PROVIDER_SITE_OTHER): Payer: Medicaid Other | Admitting: Family Medicine

## 2020-06-17 ENCOUNTER — Other Ambulatory Visit (INDEPENDENT_AMBULATORY_CARE_PROVIDER_SITE_OTHER): Payer: Self-pay | Admitting: Family Medicine

## 2020-06-17 ENCOUNTER — Other Ambulatory Visit: Payer: Self-pay

## 2020-06-17 VITALS — BP 142/94 | HR 96 | Temp 98.2°F | Ht 65.0 in | Wt 340.0 lb

## 2020-06-17 DIAGNOSIS — E559 Vitamin D deficiency, unspecified: Secondary | ICD-10-CM

## 2020-06-17 DIAGNOSIS — Z6841 Body Mass Index (BMI) 40.0 and over, adult: Secondary | ICD-10-CM | POA: Diagnosis not present

## 2020-06-17 DIAGNOSIS — E1169 Type 2 diabetes mellitus with other specified complication: Secondary | ICD-10-CM

## 2020-06-17 DIAGNOSIS — E785 Hyperlipidemia, unspecified: Secondary | ICD-10-CM | POA: Diagnosis not present

## 2020-06-17 MED ORDER — VITAMIN D (ERGOCALCIFEROL) 1.25 MG (50000 UNIT) PO CAPS: 50000.0000 [IU] | ORAL_CAPSULE | ORAL | 0 refills | Status: DC

## 2020-06-17 MED ORDER — METFORMIN HCL 500 MG PO TABS: 500.0000 mg | ORAL_TABLET | Freq: Every day | ORAL | 0 refills | Status: DC

## 2020-06-17 MED FILL — METFORMIN HCL 500 MG TABS: 500 | 30 days supply | Qty: 30 | Fill #0

## 2020-06-17 MED FILL — VIT D2 1.25 MG (50,000 UNIT: 1.25 MG | 28 days supply | Qty: 4 | Fill #0

## 2020-06-18 NOTE — Progress Notes (Signed)
Chief Complaint:   OBESITY Isabella Guerrero is here to discuss her progress with her obesity treatment plan along with follow-up of her obesity related diagnoses. Isabella Guerrero is on the Category 4 Plan and states she is following her eating plan approximately 80% of the time. Isabella Guerrero states she is doing 0 minutes 0 times per week.  Today's visit was #: 2 Starting weight: 344 lbs Starting date: 06/03/2020 Today's weight: 340 lbs Today's date: 06/17/2020 Total lbs lost to date: 4 Total lbs lost since last in-office visit: 4  Interim History: Isabella Guerrero found dinner to be difficult to follow secondary to quantity of meat. She is using half of her snack calories for coffee creamer. She is hungry throughout the day. She only ate 5 oz of meat at dinner. She deviated some from the meal plan with wings. She is worried about Thanksgiving meals.  Subjective:   1. Vitamin D deficiency Isabella Guerrero's Vit D level is 20.8, and she notes fatigue. She started 5,000 IU daily OTC. I discussed labs with the patient today.  2. Controlled type 2 diabetes mellitus with other specified complication, without long-term current use of insulin (HCC) Isabella Guerrero's recent A1c was 6.5 and insulin 18.4. She is still experiencing carbohydrate cravings. I discussed labs with the patient today.  3. Hyperlipidemia associated with type 2 diabetes mellitus (HCC) Isabella Guerrero's last LDL was 149, HDL 33, and triglycerides 761. She is not on medications. I discussed labs with the patient today.  Assessment/Plan:   1. Vitamin D deficiency Low Vitamin D level contributes to fatigue and are associated with obesity, breast, and colon cancer. Isabella Guerrero agreed to start prescription Vitamin D 50,000 IU every week with no refills. She will follow-up for routine testing of Vitamin D, at least 2-3 times per year to avoid over-replacement.  - Vitamin D, Ergocalciferol, (DRISDOL) 1.25 MG (50000 UNIT) CAPS capsule; Take 1 capsule (50,000 Units total) by mouth every 7  (seven) days.  Dispense: 4 capsule; Refill: 0  2. Controlled type 2 diabetes mellitus with other specified complication, without long-term current use of insulin (HCC) Good blood sugar control is important to decrease the likelihood of diabetic complications such as nephropathy, neuropathy, limb loss, blindness, coronary artery disease, and death. Intensive lifestyle modification including diet, exercise and weight loss are the first line of treatment for diabetes. Isabella Guerrero agreed to start metformin 500 mg q AM with no refills.  - metFORMIN (GLUCOPHAGE) 500 MG tablet; Take 1 tablet (500 mg total) by mouth daily with breakfast.  Dispense: 30 tablet; Refill: 0  3. Hyperlipidemia associated with type 2 diabetes mellitus (HCC) Cardiovascular risk and specific lipid/LDL goals reviewed. We discussed several lifestyle modifications today and Isabella Guerrero will continue to work on diet, exercise and weight loss efforts. We will repeat labs in 3 months. Orders and follow up as documented in patient record.   Counseling Intensive lifestyle modifications are the first line treatment for this issue. . Dietary changes: Increase soluble fiber. Decrease simple carbohydrates. . Exercise changes: Moderate to vigorous-intensity aerobic activity 150 minutes per week if tolerated. . Lipid-lowering medications: see documented in medical record.  4. Class 3 severe obesity with serious comorbidity and body mass index (BMI) of 50.0 to 59.9 in adult, unspecified obesity type (HCC) Isabella Guerrero is currently in the action stage of change. As such, her goal is to continue with weight loss efforts. She has agreed to the Category 4 Plan.   Exercise goals: No exercise has been prescribed at this time.  Behavioral modification strategies:  increasing lean protein intake, meal planning and cooking strategies, keeping healthy foods in the home and planning for success.  Isabella Guerrero has agreed to follow-up with our clinic in 2 weeks. She was  informed of the importance of frequent follow-up visits to maximize her success with intensive lifestyle modifications for her multiple health conditions.   Objective:   Blood pressure (!) 142/94, pulse 96, temperature 98.2 F (36.8 C), temperature source Oral, height 5\' 5"  (1.651 m), weight (!) 340 lb (154.2 kg), SpO2 96 %, unknown if currently breastfeeding. Body mass index is 56.58 kg/m.  General: Cooperative, alert, well developed, in no acute distress. HEENT: Conjunctivae and lids unremarkable. Cardiovascular: Regular rhythm.  Lungs: Normal work of breathing. Neurologic: No focal deficits.   Lab Results  Component Value Date   CREATININE 0.64 06/03/2020   BUN 11 06/03/2020   NA 139 06/03/2020   K 4.8 06/03/2020   CL 98 06/03/2020   CO2 28 06/03/2020   Lab Results  Component Value Date   ALT 16 06/03/2020   AST 18 06/03/2020   ALKPHOS 78 06/03/2020   BILITOT 0.2 06/03/2020   Lab Results  Component Value Date   HGBA1C 6.5 (H) 06/03/2020   Lab Results  Component Value Date   INSULIN 18.4 06/03/2020   Lab Results  Component Value Date   TSH 3.000 06/03/2020   Lab Results  Component Value Date   CHOL 224 (H) 06/03/2020   HDL 33 (L) 06/03/2020   LDLCALC 149 (H) 06/03/2020   TRIG 228 (H) 06/03/2020   Lab Results  Component Value Date   WBC 11.7 (H) 06/03/2020   HGB 13.5 06/03/2020   HCT 42.7 06/03/2020   MCV 85 06/03/2020   PLT 334 06/03/2020   No results found for: IRON, TIBC, FERRITIN  Attestation Statements:   Reviewed by clinician on day of visit: allergies, medications, problem list, medical history, surgical history, family history, social history, and previous encounter notes.  Time spent on visit including pre-visit chart review and post-visit care and charting was 60 minutes.    I, 06/05/2020, am acting as transcriptionist for Burt Knack, MD.  I have reviewed the above documentation for accuracy and completeness, and I agree with  the above. - Reuben Likes, MD

## 2020-07-01 ENCOUNTER — Other Ambulatory Visit: Payer: Self-pay

## 2020-07-01 ENCOUNTER — Other Ambulatory Visit (INDEPENDENT_AMBULATORY_CARE_PROVIDER_SITE_OTHER): Payer: Self-pay | Admitting: Family Medicine

## 2020-07-01 ENCOUNTER — Ambulatory Visit (INDEPENDENT_AMBULATORY_CARE_PROVIDER_SITE_OTHER): Payer: Medicaid Other | Admitting: Family Medicine

## 2020-07-01 ENCOUNTER — Encounter (INDEPENDENT_AMBULATORY_CARE_PROVIDER_SITE_OTHER): Payer: Self-pay | Admitting: Family Medicine

## 2020-07-01 VITALS — BP 126/79 | HR 99 | Temp 98.3°F | Ht 65.0 in | Wt 337.0 lb

## 2020-07-01 DIAGNOSIS — E559 Vitamin D deficiency, unspecified: Secondary | ICD-10-CM

## 2020-07-01 DIAGNOSIS — E119 Type 2 diabetes mellitus without complications: Secondary | ICD-10-CM

## 2020-07-01 DIAGNOSIS — E1169 Type 2 diabetes mellitus with other specified complication: Secondary | ICD-10-CM

## 2020-07-01 DIAGNOSIS — Z6841 Body Mass Index (BMI) 40.0 and over, adult: Secondary | ICD-10-CM

## 2020-07-01 HISTORY — DX: Type 2 diabetes mellitus without complications: E11.9

## 2020-07-01 HISTORY — DX: Vitamin D deficiency, unspecified: E55.9

## 2020-07-01 MED ORDER — METFORMIN HCL 500 MG PO TABS: 500.0000 mg | ORAL_TABLET | Freq: Every day | ORAL | 0 refills | Status: DC

## 2020-07-01 MED ORDER — VITAMIN D (ERGOCALCIFEROL) 1.25 MG (50000 UNIT) PO CAPS: 50000.0000 [IU] | ORAL_CAPSULE | ORAL | 0 refills | Status: DC

## 2020-07-02 DIAGNOSIS — Z6841 Body Mass Index (BMI) 40.0 and over, adult: Secondary | ICD-10-CM | POA: Diagnosis not present

## 2020-07-02 DIAGNOSIS — N926 Irregular menstruation, unspecified: Secondary | ICD-10-CM

## 2020-07-02 DIAGNOSIS — N393 Stress incontinence (female) (male): Secondary | ICD-10-CM | POA: Diagnosis not present

## 2020-07-02 DIAGNOSIS — Z01419 Encounter for gynecological examination (general) (routine) without abnormal findings: Secondary | ICD-10-CM | POA: Diagnosis not present

## 2020-07-02 HISTORY — DX: Irregular menstruation, unspecified: N92.6

## 2020-07-03 NOTE — Progress Notes (Signed)
Chief Complaint:   OBESITY Isabella Guerrero is here to discuss her progress with her obesity treatment plan along with follow-up of her obesity related diagnoses. Markiyah is on the Category 4 Plan and states she is following her eating plan approximately 80% of the time. Taylore states she has no intentional exercise but is active at work, up and down laying down flooring.  Today's visit was #: 3 Starting weight: 344 lbs Starting date: 06/03/2020 Today's weight: 337 lbs Today's date: 07/01/2020 Total lbs lost to date: 7 lbs Total lbs lost since last in-office visit: 3 lbs Total weight loss percentage to date: -2.03%   Interim History: During the day Isabella Guerrero is hungry from the time she wakes up until 4 pm in the afternoon when she eats dinner.  Plan: Lanesha will increase intake of healthy 10:1 ratio foods and add 100 calories mid-morning and 100-calories mid-afternoon of such foods.    - Also, increase water intake to 1 gallon a day.   In regards to initiating and encouraging healthy lifestyle changes for our patient today: -Evidence-based interventions for health behavior change were utilized today including the use of self monitoring techniques, problem-solving barriers and SMART goal setting techniques.   -Specifically regarding patient's less desirable eating habits and patterns, we employed the technique of small changes when Isabella Guerrero has not been able to fully commit to her prudent nutritional plan.  Assessment/Plan:   1. Type 2 diabetes mellitus with other specified complication, without long-term current use of insulin (HCC) Zelphia is newly diagnosed with Type 2 diabetes mellitus. She is tolerating metformin well without side effects.  Plan: Refill metformin for 1 month.  Refill- metFORMIN (GLUCOPHAGE) 500 MG tablet; Take 1 tablet (500 mg total) by mouth daily with breakfast.  Dispense: 30 tablet; Refill: 0   2. Vitamin D deficiency Terrell is newly diagnosed with Vit D deficiency.  Selita's Vitamin D level was 20.8 on 06/03/2020. She is currently taking prescription vitamin D 50,000 IU each week. She denies nausea, vomiting or muscle weakness.  Plan: - Reiterated importance of vitamin D (as well as calcium) to their health and wellbeing.  - Reminded Isabella Guerrero Signs that weight loss will likely improve availability of vitamin D, thus encouraged her to continue with meal plan and their weight loss efforts to further improve this condition. - I recommend patient continue to take weekly prescription vit D 50,000 IU - Informed patient this may be a lifelong thing, and she was encouraged to continue to take the medicine until told otherwise.   - We will need to monitor levels regularly (every 3-4 mo on average) to keep levels within normal limits.  - pt's questions and concerns regarding this condition addressed.  Refill- Vitamin D, Ergocalciferol, (DRISDOL) 1.25 MG (50000 UNIT) CAPS capsule; Take 1 capsule (50,000 Units total) by mouth every 7 (seven) days.  Dispense: 4 capsule; Refill: 0   3. Class 3 severe obesity with serious comorbidity and body mass index (BMI) of 50.0 to 59.9 in adult, unspecified obesity type (HCC) Isabella Guerrero is currently in the action stage of change. As such, her goal is to continue with weight loss efforts. She has agreed to the Category 4 Plan plus 200 calories of 10:1 ratio foods.   Exercise goals: As is  Behavioral modification strategies: increasing lean protein intake, decreasing simple carbohydrates and increasing water intake.  Isabella Guerrero has agreed to follow-up with our clinic in 2 weeks. She was informed of the importance of frequent follow-up visits to maximize  her success with intensive lifestyle modifications for her multiple health conditions.    Objective:   Blood pressure 126/79, pulse 99, temperature 98.3 F (36.8 C), height 5\' 5"  (1.651 m), weight (!) 337 lb (152.9 kg), SpO2 96 %, unknown if currently breastfeeding. Body mass index is  56.08 kg/m.  General: Cooperative, alert, well developed, in no acute distress. HEENT: Conjunctivae and lids unremarkable. Cardiovascular: Regular rhythm.  Lungs: Normal work of breathing. Neurologic: No focal deficits.   Lab Results  Component Value Date   CREATININE 0.64 06/03/2020   BUN 11 06/03/2020   NA 139 06/03/2020   K 4.8 06/03/2020   CL 98 06/03/2020   CO2 28 06/03/2020   Lab Results  Component Value Date   ALT 16 06/03/2020   AST 18 06/03/2020   ALKPHOS 78 06/03/2020   BILITOT 0.2 06/03/2020   Lab Results  Component Value Date   HGBA1C 6.5 (H) 06/03/2020   Lab Results  Component Value Date   INSULIN 18.4 06/03/2020   Lab Results  Component Value Date   TSH 3.000 06/03/2020   Lab Results  Component Value Date   CHOL 224 (H) 06/03/2020   HDL 33 (L) 06/03/2020   LDLCALC 149 (H) 06/03/2020   TRIG 228 (H) 06/03/2020   Lab Results  Component Value Date   WBC 11.7 (H) 06/03/2020   HGB 13.5 06/03/2020   HCT 42.7 06/03/2020   MCV 85 06/03/2020   PLT 334 06/03/2020    Attestation Statements:   Reviewed by clinician on day of visit: allergies, medications, problem list, medical history, surgical history, family history, social history, and previous encounter notes.  06/05/2020, am acting as Edmund Hilda for Energy manager, DO.  I have reviewed the above documentation for accuracy and completeness, and I agree with the above. -  *[employer], D.O.  The 21st Century Cures Act was signed into law in 2016 which includes the topic of electronic health records.  This provides immediate access to information in MyChart.  This includes consultation notes, operative notes, office notes, lab results and pathology reports.  If you have any questions about what you read please let 2017 know at your next visit so we can discuss your concerns and take corrective action if need be.  We are right here with you.

## 2020-07-08 MED FILL — VIT D2 1.25 MG (50,000 UNIT: 1.25 MG | 28 days supply | Qty: 4 | Fill #0

## 2020-07-10 MED FILL — METFORMIN HCL 500 MG TABS: 500 | 30 days supply | Qty: 30 | Fill #0

## 2020-07-16 DIAGNOSIS — N926 Irregular menstruation, unspecified: Secondary | ICD-10-CM | POA: Diagnosis not present

## 2020-07-17 ENCOUNTER — Ambulatory Visit (INDEPENDENT_AMBULATORY_CARE_PROVIDER_SITE_OTHER): Payer: Medicaid Other | Admitting: Family Medicine

## 2020-07-17 ENCOUNTER — Encounter (INDEPENDENT_AMBULATORY_CARE_PROVIDER_SITE_OTHER): Payer: Self-pay | Admitting: Family Medicine

## 2020-07-17 ENCOUNTER — Other Ambulatory Visit: Payer: Self-pay

## 2020-07-17 VITALS — BP 147/84 | HR 93 | Temp 98.9°F | Ht 65.0 in | Wt 336.0 lb

## 2020-07-17 DIAGNOSIS — E1165 Type 2 diabetes mellitus with hyperglycemia: Secondary | ICD-10-CM | POA: Diagnosis not present

## 2020-07-17 DIAGNOSIS — Z6841 Body Mass Index (BMI) 40.0 and over, adult: Secondary | ICD-10-CM

## 2020-07-17 DIAGNOSIS — Z9189 Other specified personal risk factors, not elsewhere classified: Secondary | ICD-10-CM

## 2020-07-17 DIAGNOSIS — I10 Essential (primary) hypertension: Secondary | ICD-10-CM | POA: Diagnosis not present

## 2020-07-17 MED ORDER — LABETALOL HCL 100 MG PO TABS: 100.0000 mg | ORAL_TABLET | Freq: Two times a day (BID) | ORAL | 0 refills | Status: DC

## 2020-07-17 MED ORDER — OZEMPIC (0.25 OR 0.5 MG/DOSE) 2 MG/1.5ML ~~LOC~~ SOPN: 0.5000 mg | PEN_INJECTOR | SUBCUTANEOUS | 0 refills | Status: DC

## 2020-07-17 MED FILL — LABETALOL HCL 100MG TABLET: 100 | 30 days supply | Qty: 60 | Fill #0

## 2020-07-18 ENCOUNTER — Ambulatory Visit (INDEPENDENT_AMBULATORY_CARE_PROVIDER_SITE_OTHER): Payer: Medicaid Other | Admitting: Family Medicine

## 2020-07-18 ENCOUNTER — Ambulatory Visit: Payer: Medicaid Other | Admitting: Family Medicine

## 2020-07-18 ENCOUNTER — Telehealth (INDEPENDENT_AMBULATORY_CARE_PROVIDER_SITE_OTHER): Payer: Self-pay

## 2020-07-18 ENCOUNTER — Other Ambulatory Visit: Payer: Self-pay | Admitting: Family Medicine

## 2020-07-18 ENCOUNTER — Encounter: Payer: Self-pay | Admitting: Family Medicine

## 2020-07-18 VITALS — BP 134/82 | HR 90 | Temp 98.5°F | Ht 65.0 in | Wt 342.0 lb

## 2020-07-18 DIAGNOSIS — M79644 Pain in right finger(s): Secondary | ICD-10-CM

## 2020-07-18 DIAGNOSIS — G8929 Other chronic pain: Secondary | ICD-10-CM | POA: Diagnosis not present

## 2020-07-18 DIAGNOSIS — I10 Essential (primary) hypertension: Secondary | ICD-10-CM

## 2020-07-18 DIAGNOSIS — G5601 Carpal tunnel syndrome, right upper limb: Secondary | ICD-10-CM

## 2020-07-18 DIAGNOSIS — Z800 Family history of malignant neoplasm of digestive organs: Secondary | ICD-10-CM

## 2020-07-18 DIAGNOSIS — K921 Melena: Secondary | ICD-10-CM | POA: Diagnosis not present

## 2020-07-18 MED ORDER — CHLORTHALIDONE 50 MG PO TABS: 50.0000 mg | ORAL_TABLET | Freq: Every day | ORAL | 3 refills | Status: DC

## 2020-07-18 MED FILL — CHLORTHALIDONE 50 MG TABS: 50 | 90 days supply | Qty: 90 | Fill #0

## 2020-07-18 NOTE — Progress Notes (Signed)
Isabella Guerrero is a 29 y.o. female  Chief Complaint  Patient presents with  . Acute Visit    C/o having blood when she had a bowel movement x 1 week.  Wants referral to GI.  Also having pain in her Rt thumb.      HPI: Isabella Guerrero is a 29 y.o. female who complains of 1 episode of dark blood "like a clot" that was the size of a half dollar that she passed while having a normal size BM. Stool was dark in color but pt has taken pepto bismol prior to BM. No rectal pain or abdominal pain. No n/v. normal appetite.   Pt had been on metformin and was having GI upset and multiple loose stools per day. This has resolved off the metformin.  Father with stage IV colon cancer. MGM and maternal aunt with CRC and maternal uncle with esophageal cancer.  Pt has a PMHx significant for GERD as well as constipation.  Pt has h/o problems with thumb pain and ROM. She had cortisone injections B/L at 29yo and 29yo and not since that time. Pt now with pain at base of Rt thumb, limited movement x 4 mo.  She also complains of numbness in thumb, index, and 5th fingers - intermittent but getting more frequent and symptomatic. No edema.  Pt has a h/o HTN and is on labetalol 100mg  BID. She often forgets second dose. She is not planning any future pregnancies and is not nursing.   Past Medical History:  Diagnosis Date  . Asthma   . Back pain   . Constipation   . Fatigue   . Foot pain   . GDM (gestational diabetes mellitus) 12/2018  . GERD (gastroesophageal reflux disease)   . Gestational diabetes   . Hypertension   . Joint pain   . Knee pain   . Lower back pain   . Palpitations   . Prediabetes   . Shortness of breath   . Shortness of breath on exertion   . Sleep apnea     Past Surgical History:  Procedure Laterality Date  . CESAREAN SECTION N/A 03/01/2019   Procedure: CESAREAN SECTION;  Surgeon: 03/03/2019, MD;  Location: [facility] ORS;  Service: Obstetrics;  Laterality: N/A;  need extra Huel Cote in OR  and request RNFA  . DRUG INDUCED ENDOSCOPY    . FINGER SURGERY    . TONSILLECTOMY    . WISDOM TOOTH EXTRACTION      Social History   Socioeconomic History  . Marital status: Single    Spouse name: Not on file  . Number of children: Not on file  . Years of education: Not on file  . Highest education level: Not on file  Occupational History  . Occupation: homemaker  Tobacco Use  . Smoking status: Current Every Day Smoker    Packs/day: 0.25    Types: Cigarettes    Last attempt to quit: 07/08/2018    Years since quitting: 2.0  . Smokeless tobacco: Never Used  . Tobacco comment: Currently taking medication to assist in cessation  Vaping Use  . Vaping Use: Never used  Substance and Sexual Activity  . Alcohol use: Yes    Alcohol/week: 1.0 standard drink    Types: 1 Cans of beer per week    Comment: once in a while  . Drug use: Never  . Sexual activity: Yes    Partners: Male    Birth control/protection: Implant  Other Topics Concern  .  Not on file  Social History Narrative  . Not on file   Social Determinants of Health   Financial Resource Strain: Not on file  Food Insecurity: Not on file  Transportation Needs: Not on file  Physical Activity: Not on file  Stress: Not on file  Social Connections: Not on file  Intimate Partner Violence: Not on file    Family History  Problem Relation Age of Onset  . Diabetes Mother   . Hypertension Mother   . Heart disease Mother   . Hyperlipidemia Mother   . Thyroid disease Mother   . Obesity Mother   . Hypertension Father   . Diabetes Father   . Hyperlipidemia Father   . Heart disease Father   . Cancer Father   . Sleep apnea Father   . Obesity Father   . Esophageal cancer Maternal Uncle   . Colon cancer Maternal Grandmother      Immunization History  Administered Date(s) Administered  . Influenza-Unspecified 07/13/2020  . PFIZER SARS-COV-2 Vaccination 08/21/2019, 09/13/2019, 07/13/2020    Outpatient Encounter  Medications as of 07/18/2020  Medication Sig  . acetaminophen (TYLENOL) 500 MG tablet Take 1,000 mg by mouth as needed.  . cetirizine (ZYRTEC) 10 MG chewable tablet Chew 10 mg by mouth daily.  Marland Kitchen etonogestrel (NEXPLANON) 68 MG IMPL implant 1 each by Subdermal route once.  . fluticasone (FLONASE) 50 MCG/ACT nasal spray Place 1 spray into both nostrils daily.  Marland Kitchen ibuprofen (ADVIL) 800 MG tablet Take 800 mg by mouth as needed.  . Semaglutide,0.25 or 0.5MG /DOS, (OZEMPIC, 0.25 OR 0.5 MG/DOSE,) 2 MG/1.5ML SOPN Inject 0.5 mg into the skin once a week.  . Vitamin D, Ergocalciferol, (DRISDOL) 1.25 MG (50000 UNIT) CAPS capsule Take 1 capsule (50,000 Units total) by mouth every 7 (seven) days.  . [DISCONTINUED] labetalol (NORMODYNE) 100 MG tablet Take 1 tablet (100 mg total) by mouth 2 (two) times daily.  . chlorthalidone (HYGROTON) 50 MG tablet Take 1 tablet (50 mg total) by mouth daily.  . [DISCONTINUED] metFORMIN (GLUCOPHAGE) 500 MG tablet Take 1 tablet (500 mg total) by mouth daily with breakfast. (Patient not taking: Reported on 07/18/2020)   No facility-administered encounter medications on file as of 07/18/2020.     ROS: Pertinent positives and negatives noted in HPI. Remainder of ROS non-contributory   Allergies  Allergen Reactions  . Scallops [Shellfish Allergy] Anaphylaxis and Hives  . Adhesive [Tape] Rash  . Latex Rash    BP 134/82   Pulse 90   Temp 98.5 F (36.9 C) (Temporal)   Ht 5\' 5"  (1.651 m)   Wt (!) 342 lb (155.1 kg)   SpO2 96%   BMI 56.91 kg/m  BP Readings from Last 3 Encounters:  07/18/20 134/82  07/17/20 (!) 147/84  07/01/20 126/79    Physical Exam Constitutional:      General: She is not in acute distress.    Appearance: Normal appearance. She is obese. She is not ill-appearing.  Musculoskeletal:     Right wrist: Tenderness and bony tenderness present. No deformity, effusion or snuff box tenderness. Normal range of motion.     Right hand: Tenderness (TTP at base  of thumb) present. Decreased range of motion. Normal strength. Normal pulse.  Neurological:     Mental Status: She is alert and oriented to person, place, and time.  Psychiatric:        Mood and Affect: Mood normal.        Behavior: Behavior normal.  A/P:  1. Essential hypertension - pt on BID labetalol but often forgets second dose - she is not nursing and does not plan to get pregnant again - stop labetalol Rx: - chlorthalidone (HYGROTON) 50 MG tablet; Take 1 tablet (50 mg total) by mouth daily.  Dispense: 90 tablet; Refill: 3 - pt to take BP at home 3-4x/wk x 2 wks and send readings to me via MyChart. Will adjust med if needed  2. Melena - 1 episode of dark blood clot with BM about 1 week ago. No other symptoms. - pt does have significant family h/o CRC - Ambulatory referral to Gastroenterology  3. Family history of colon cancer in father - Ambulatory referral to Gastroenterology  4. Chronic thumb pain, right 5. Carpal tunnel syndrome of right wrist - Ambulatory referral to Sports Medicine    This visit occurred during the SARS-CoV-2 public health emergency.  Safety protocols were in place, including screening questions prior to the visit, additional usage of staff PPE, and extensive cleaning of exam room while observing appropriate contact time as indicated for disinfecting solutions.

## 2020-07-18 NOTE — Telephone Encounter (Signed)
PA has been initiated via [email or web address] for Ozempic 0.25/.5mg / 1.28mL  [name] Signs Key: Val Riles - PA Case ID: [id number] Need help? Call us at [phone] Status Sent to [employer] Ozempic (0.25 or 0.5 MG/DOSE) 2MG /1.5ML pen-injectors Form [facility] [facility] Electronic [facility] Form [id number] NCPDP)

## 2020-07-18 NOTE — Telephone Encounter (Signed)
PA for Ozempic has been denied. Currently awaiting response with rationale from insurance company via fax.  Isabella Guerrero (Key: [id number]) Ozempic (0.25 or 0.5 MG/DOSE) 2MG /1.5ML pen-injectors   Form [facility] [facility] Electronic [facility] Form [phone] NCPDP) Created 1 hour ago Sent to Plan 1 hour ago Plan Response 1 hour ago Submit Clinical Questions 1 hour ago Determination Unfavorable 10 minutes ago Message from Plan PA Case: ([id number], Status: Denied. Notification: Completed.

## 2020-07-18 NOTE — Progress Notes (Signed)
Chief Complaint:   OBESITY Isabella Guerrero is here to discuss her progress with her obesity treatment plan along with follow-up of her obesity related diagnoses. Isabella Guerrero is on the Category 4 Plan + 200 calories and states she is following her eating plan approximately 50-60% of the time. Isabella Guerrero states she is doing 0 minutes 0 times per week.  Today's visit was #: 4 Starting weight: 344 lbs Starting date: 06/03/2020 Today's weight: 336 lbs Today's date: 07/17/2020 Total lbs lost to date: 8 Total lbs lost since last in-office visit: 1  Interim History: Isabella Guerrero has found the last few weeks to be difficult to follow the plan secondary to the holidays. She did have 1 episode of hemoccult positive stool. She voices that she was getting hungry at her last appointment but she hasn't really been using extra calories. She is planning on staying on the plan during Christmas.  Subjective:   1. Type 2 diabetes mellitus with hyperglycemia, without long-term current use of insulin (HCC) Isabella Guerrero is on metformin with GI side effects. Last A1c was 6.5. She is not nursing and she has nexplanon for contraception.   2. Essential hypertension Isabella Guerrero's blood pressure is slightly elevated today. She denies chest pain, chest pressure, or headache.  3. At risk for heart disease Isabella Guerrero is at a higher than average risk for cardiovascular disease due to obesity.   Assessment/Plan:   1. Type 2 diabetes mellitus with hyperglycemia, without long-term current use of insulin (HCC) Good blood sugar control is important to decrease the likelihood of diabetic complications such as nephropathy, neuropathy, limb loss, blindness, coronary artery disease, and death. Intensive lifestyle modification including diet, exercise and weight loss are the first line of treatment for diabetes. Isabella Guerrero agreed to start Ozempic 0.25 mg SubQ weekly #2 mL with no refills.  - Semaglutide,0.25 or 0.5MG /DOS, (OZEMPIC, 0.25 OR 0.5 MG/DOSE,) 2  MG/1.5ML SOPN; Inject 0.5 mg into the skin once a week.  Dispense: 1.5 mL; Refill: 0  2. Essential hypertension Isabella Guerrero is working on healthy weight loss and exercise to improve blood pressure control. We will watch for signs of hypotension as she continues her lifestyle modifications. We will refill Labetalol 100 mg PO BID #60 for 1 month. Isabella Guerrero is to follow up with her primary care physician for further management.  3. At risk for heart disease Isabella Guerrero was given approximately 15 minutes of coronary artery disease prevention counseling today. She is 29 y.o. female and has risk factors for heart disease including obesity. We discussed intensive lifestyle modifications today with an emphasis on specific weight loss instructions and strategies.   Repetitive spaced learning was employed today to elicit superior memory formation and behavioral change.  4. Class 3 severe obesity with serious comorbidity and body mass index (BMI) of 50.0 to 59.9 in adult, unspecified obesity type (HCC) Isabella Guerrero is currently in the action stage of change. As such, her goal is to continue with weight loss efforts. She has agreed to the Category 4 Plan.   Exercise goals: No exercise has been prescribed at this time.  Behavioral modification strategies: increasing lean protein intake, meal planning and cooking strategies, keeping healthy foods in the home and holiday eating strategies .  Isabella Guerrero has agreed to follow-up with our clinic in 2 weeks. She was informed of the importance of frequent follow-up visits to maximize her success with intensive lifestyle modifications for her multiple health conditions.   Objective:   Blood pressure (!) 147/84, pulse 93, temperature 98.9 F (37.2  C), temperature source Oral, height 5\' 5"  (1.651 m), weight (!) 336 lb (152.4 kg), SpO2 96 %, unknown if currently breastfeeding. Body mass index is 55.91 kg/m.  General: Cooperative, alert, well developed, in no acute distress. HEENT:  Conjunctivae and lids unremarkable. Cardiovascular: Regular rhythm.  Lungs: Normal work of breathing. Neurologic: No focal deficits.   Lab Results  Component Value Date   CREATININE 0.64 06/03/2020   BUN 11 06/03/2020   NA 139 06/03/2020   K 4.8 06/03/2020   CL 98 06/03/2020   CO2 28 06/03/2020   Lab Results  Component Value Date   ALT 16 06/03/2020   AST 18 06/03/2020   ALKPHOS 78 06/03/2020   BILITOT 0.2 06/03/2020   Lab Results  Component Value Date   HGBA1C 6.5 (H) 06/03/2020   Lab Results  Component Value Date   INSULIN 18.4 06/03/2020   Lab Results  Component Value Date   TSH 3.000 06/03/2020   Lab Results  Component Value Date   CHOL 224 (H) 06/03/2020   HDL 33 (L) 06/03/2020   LDLCALC 149 (H) 06/03/2020   TRIG 228 (H) 06/03/2020   Lab Results  Component Value Date   WBC 11.7 (H) 06/03/2020   HGB 13.5 06/03/2020   HCT 42.7 06/03/2020   MCV 85 06/03/2020   PLT 334 06/03/2020   No results found for: IRON, TIBC, FERRITIN  Attestation Statements:   Reviewed by clinician on day of visit: allergies, medications, problem list, medical history, surgical history, family history, social history, and previous encounter notes.   I, 06/05/2020, am acting as transcriptionist for Burt Knack, MD.  I have reviewed the above documentation for accuracy and completeness, and I agree with the above. - Reuben Likes, MD

## 2020-07-22 ENCOUNTER — Encounter (INDEPENDENT_AMBULATORY_CARE_PROVIDER_SITE_OTHER): Payer: Self-pay | Admitting: Family Medicine

## 2020-07-25 IMAGING — DX DG KNEE COMPLETE 4+V*L*
4 series · 4 of 4 positions shown · non-contrast
Comparison: None.

CLINICAL DATA: Left knee pain after injury yesterday.

EXAM:
LEFT KNEE - COMPLETE 4+ VIEW

[knee pa]
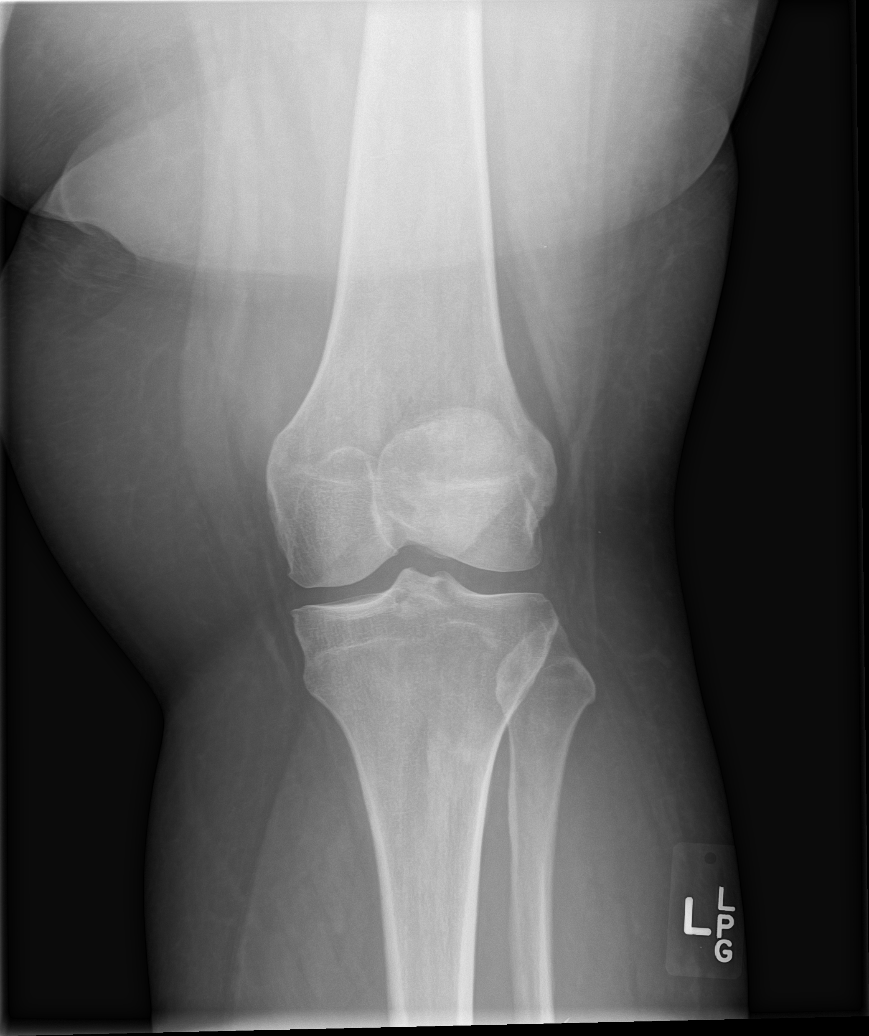

[knee obl (1 of 2)]
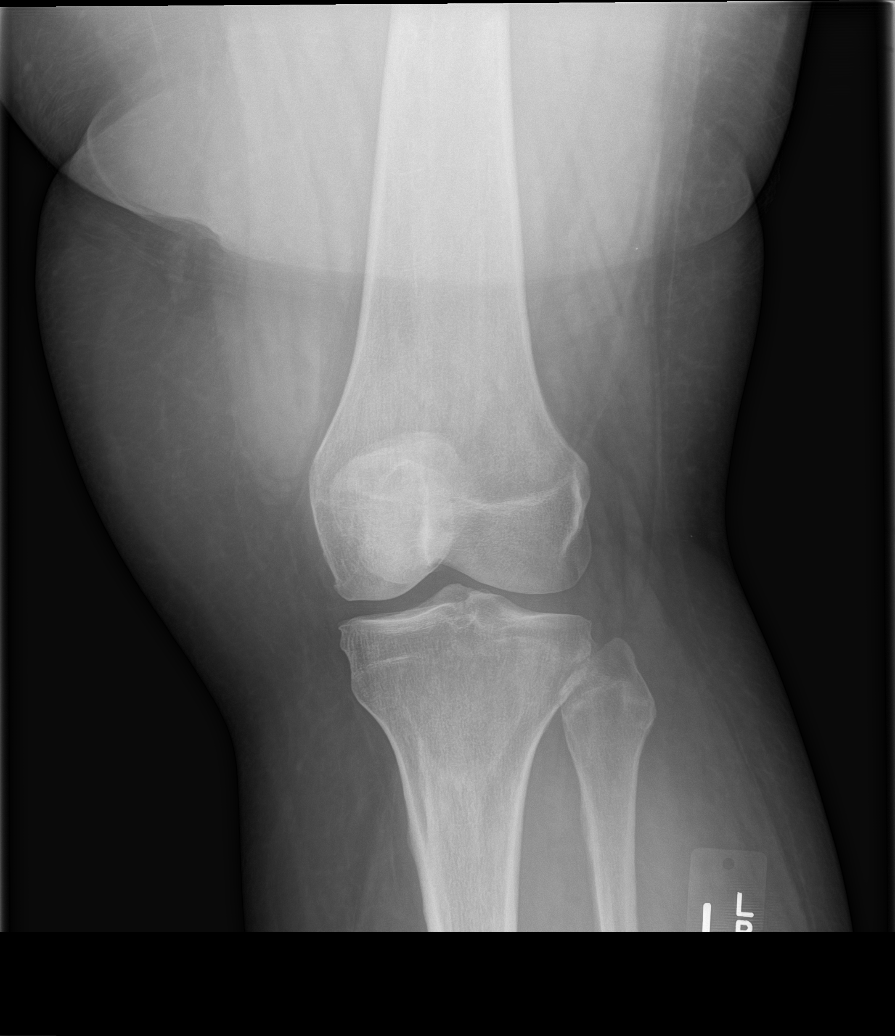

[knee obl (2 of 2)]
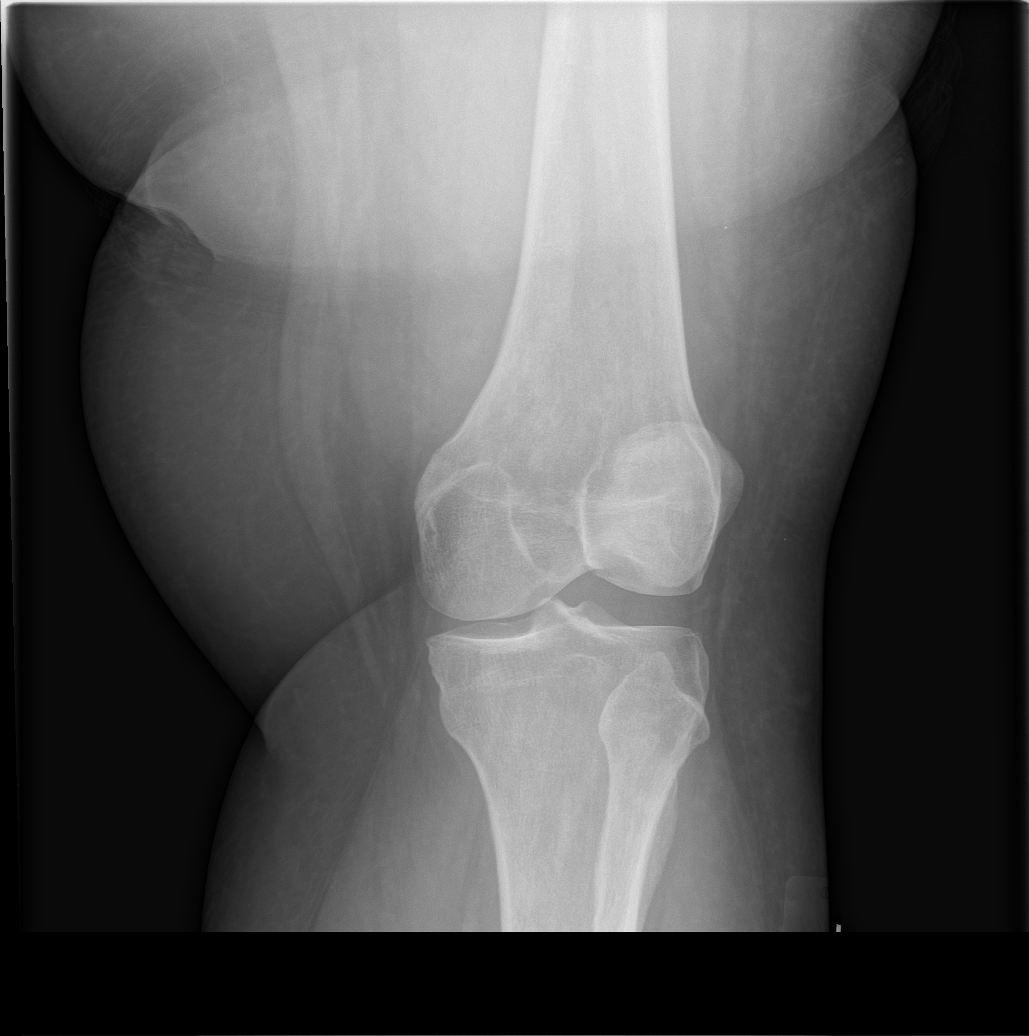

[knee lat]
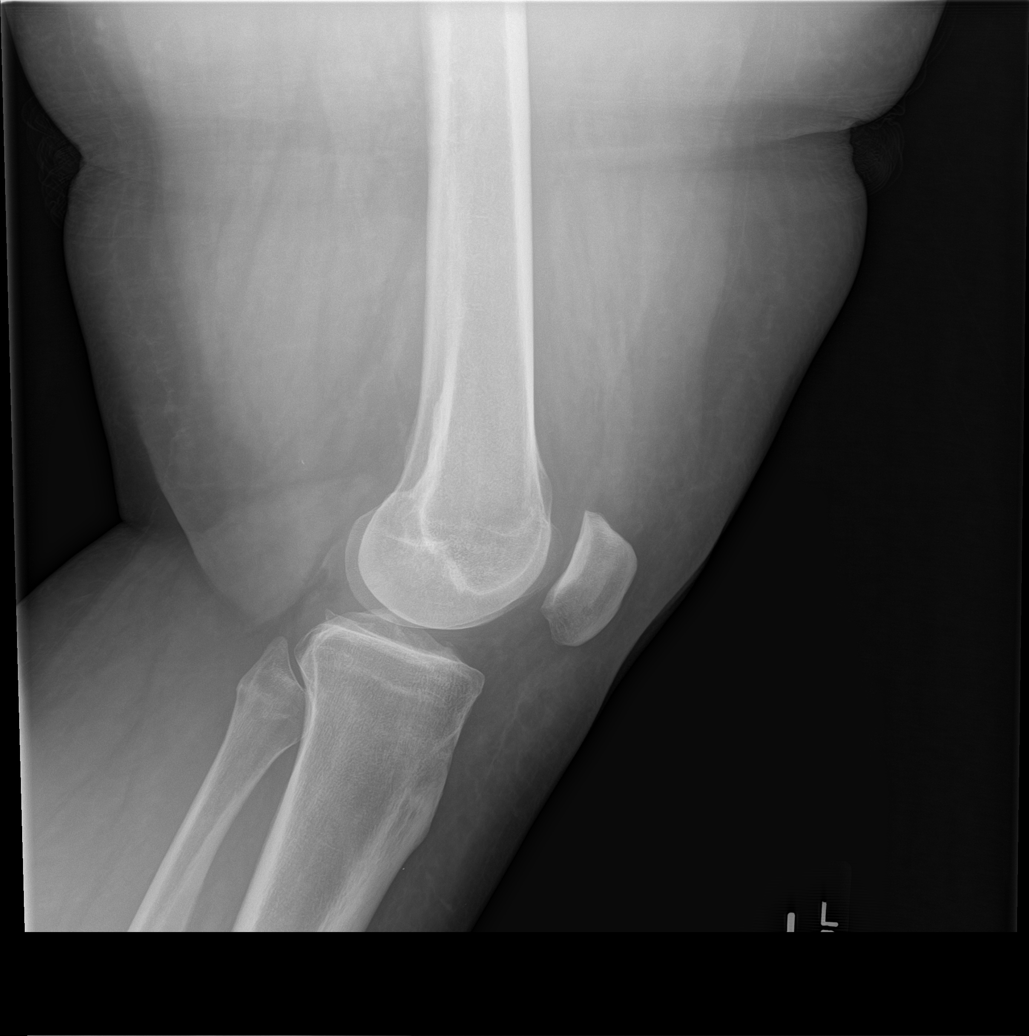

[4 of 4 positions shown; findings below may reference images not displayed]

FINDINGS: No evidence of fracture, dislocation, or joint effusion. No evidence
of arthropathy or other focal bone abnormality. Soft tissues are
unremarkable.
IMPRESSION: Negative.

## 2020-07-28 ENCOUNTER — Encounter: Payer: Self-pay | Admitting: Family Medicine

## 2020-07-28 DIAGNOSIS — I10 Essential (primary) hypertension: Secondary | ICD-10-CM

## 2020-07-29 ENCOUNTER — Other Ambulatory Visit (INDEPENDENT_AMBULATORY_CARE_PROVIDER_SITE_OTHER): Payer: Self-pay | Admitting: Family Medicine

## 2020-07-29 ENCOUNTER — Encounter (INDEPENDENT_AMBULATORY_CARE_PROVIDER_SITE_OTHER): Payer: Self-pay | Admitting: Family Medicine

## 2020-07-29 ENCOUNTER — Other Ambulatory Visit: Payer: Self-pay

## 2020-07-29 ENCOUNTER — Ambulatory Visit (INDEPENDENT_AMBULATORY_CARE_PROVIDER_SITE_OTHER): Payer: Medicaid Other | Admitting: Family Medicine

## 2020-07-29 VITALS — BP 139/83 | HR 109 | Temp 99.3°F | Ht 65.0 in | Wt 327.0 lb

## 2020-07-29 DIAGNOSIS — Z9189 Other specified personal risk factors, not elsewhere classified: Secondary | ICD-10-CM | POA: Diagnosis not present

## 2020-07-29 DIAGNOSIS — I10 Essential (primary) hypertension: Secondary | ICD-10-CM | POA: Diagnosis not present

## 2020-07-29 DIAGNOSIS — Z6841 Body Mass Index (BMI) 40.0 and over, adult: Secondary | ICD-10-CM

## 2020-07-29 DIAGNOSIS — E1165 Type 2 diabetes mellitus with hyperglycemia: Secondary | ICD-10-CM

## 2020-07-29 DIAGNOSIS — E559 Vitamin D deficiency, unspecified: Secondary | ICD-10-CM | POA: Diagnosis not present

## 2020-07-29 MED ORDER — VITAMIN D (ERGOCALCIFEROL) 1.25 MG (50000 UNIT) PO CAPS: 50000.0000 [IU] | ORAL_CAPSULE | ORAL | 0 refills | Status: DC

## 2020-07-29 MED ORDER — TRULICITY 0.75 MG/0.5ML ~~LOC~~ SOAJ: 0.7500 mg | SUBCUTANEOUS | 0 refills | Status: DC

## 2020-07-29 MED FILL — TRULICITY 0.75 MG/0.5 ML PE: 0.75 | 28 days supply | Qty: 2 | Fill #0

## 2020-07-29 MED FILL — VIT D2 1.25 MG (50,000 UNIT: 1.25 MG | 28 days supply | Qty: 4 | Fill #0

## 2020-07-29 NOTE — Telephone Encounter (Signed)
Please see message and advise.  Thank you. ° °

## 2020-07-30 NOTE — Progress Notes (Signed)
Chief Complaint:   OBESITY Isabella Guerrero is here to discuss her progress with her obesity treatment plan along with follow-up of her obesity related diagnoses. Isabella Guerrero is on the Category 4 Plan and states she is following her eating plan approximately 80-90% of the time. Isabella Guerrero states she is doing 0 minutes 0 times per week.  Today's visit was #: 5 Starting weight: 344 lbs Starting date: 06/03/2020 Today's weight: 327 lbs Today's date: 07/29/2020 Total lbs lost to date: 17 Total lbs lost since last in-office visit: 9  Interim History: Isabella Guerrero has been experiencing quite a bit of dizziness yesterday. She is on Category 4 and she did really well following the plan. She is having a brunch to celebrate Christmas. She is getting all of her protein in daily. She is experiencing nausea fairly consistently throughout the day, but she is still able to eat. She denies hunger.  Subjective:   1. Vitamin D deficiency Isabella Guerrero denies nausea, vomiting, or muscle weakness, but she notes fatigue. She is on prescription Vit D.  2. Type 2 diabetes mellitus with hyperglycemia, without long-term current use of insulin (HCC) Isabella Guerrero notes nausea, and she stopped glucophage. She denies feelings of hypoglycemia.  3. Essential hypertension Isabella Guerrero's blood pressure is controlled today. She denies chest pain, chest pressure, or headache. She is measaging Dr. Barron Alvine for further management.   4. At risk for side effect of medication Isabella Guerrero is at risk for drug side effects due to starting Victoza.  Assessment/Plan:   1. Vitamin D deficiency Low Vitamin D level contributes to fatigue and are associated with obesity, breast, and colon cancer. We will refill prescription Vitamin D for 1 month. Isabella Guerrero will follow-up for routine testing of Vitamin D, at least 2-3 times per year to avoid over-replacement.  - Vitamin D, Ergocalciferol, (DRISDOL) 1.25 MG (50000 UNIT) CAPS capsule; Take 1 capsule (50,000 Units total) by  mouth every 7 (seven) days.  Dispense: 4 capsule; Refill: 0  2. Type 2 diabetes mellitus with hyperglycemia, without long-term current use of insulin (HCC) Good blood sugar control is important to decrease the likelihood of diabetic complications such as nephropathy, neuropathy, limb loss, blindness, coronary artery disease, and death. Intensive lifestyle modification including diet, exercise and weight loss are the first line of treatment for diabetes. Isabella Guerrero agreed to start trulicity 0.75 mg SubQ weekly #2 mL with no refills.  - Dulaglutide (TRULICITY) 0.75 MG/0.5ML SOPN; Inject 0.75 mg into the skin once a week.  Dispense: 2 mL; Refill: 0  3. Essential hypertension Isabella Guerrero is working on healthy weight loss and exercise to improve blood pressure control. We will watch for signs of hypotension as she continues her lifestyle modifications. Isabella Guerrero will follow up with her primary care physician for further guidance.  4. At risk for side effect of medication Isabella Guerrero was given approximately 15 minutes of drug side effect counseling today.  We discussed side effect possibility and risk versus benefits. Isabella Guerrero agreed to the medication and will contact this office if these side effects are intolerable.  Repetitive spaced learning was employed today to elicit superior memory formation and behavioral change.  5. Class 3 severe obesity with serious comorbidity and body mass index (BMI) of 50.0 to 59.9 in adult, unspecified obesity type (HCC) Isabella Guerrero is currently in the action stage of change. As such, her goal is to continue with weight loss efforts. She has agreed to the Category 4 Plan.   Exercise goals: No exercise has been prescribed at this time.  Behavioral modification strategies: increasing lean protein intake, meal planning and cooking strategies, keeping healthy foods in the home and planning for success.  Isabella Guerrero has agreed to follow-up with our clinic in 3 weeks. She was informed of the  importance of frequent follow-up visits to maximize her success with intensive lifestyle modifications for her multiple health conditions.   Objective:   Blood pressure 139/83, pulse (!) 109, temperature 99.3 F (37.4 C), temperature source Oral, height 5\' 5"  (1.651 m), weight (!) 327 lb (148.3 kg), last menstrual period 07/25/2020, SpO2 95 %, unknown if currently breastfeeding. Body mass index is 54.42 kg/m.  General: Cooperative, alert, well developed, in no acute distress. HEENT: Conjunctivae and lids unremarkable. Cardiovascular: Regular rhythm.  Lungs: Normal work of breathing. Neurologic: No focal deficits.   Lab Results  Component Value Date   CREATININE 0.64 06/03/2020   BUN 11 06/03/2020   NA 139 06/03/2020   K 4.8 06/03/2020   CL 98 06/03/2020   CO2 28 06/03/2020   Lab Results  Component Value Date   ALT 16 06/03/2020   AST 18 06/03/2020   ALKPHOS 78 06/03/2020   BILITOT 0.2 06/03/2020   Lab Results  Component Value Date   HGBA1C 6.5 (H) 06/03/2020   Lab Results  Component Value Date   INSULIN 18.4 06/03/2020   Lab Results  Component Value Date   TSH 3.000 06/03/2020   Lab Results  Component Value Date   CHOL 224 (H) 06/03/2020   HDL 33 (L) 06/03/2020   LDLCALC 149 (H) 06/03/2020   TRIG 228 (H) 06/03/2020   Lab Results  Component Value Date   WBC 11.7 (H) 06/03/2020   HGB 13.5 06/03/2020   HCT 42.7 06/03/2020   MCV 85 06/03/2020   PLT 334 06/03/2020   No results found for: IRON, TIBC, FERRITIN  Attestation Statements:   Reviewed by clinician on day of visit: allergies, medications, problem list, medical history, surgical history, family history, social history, and previous encounter notes.   I, 06/05/2020, am acting as transcriptionist for Burt Knack, MD.  I have reviewed the above documentation for accuracy and completeness, and I agree with the above. - Reuben Likes, MD

## 2020-07-31 ENCOUNTER — Other Ambulatory Visit: Payer: Self-pay | Admitting: Family Medicine

## 2020-07-31 MED ORDER — HYDROCHLOROTHIAZIDE 12.5 MG PO CAPS: 12.5000 mg | ORAL_CAPSULE | Freq: Every day | ORAL | 3 refills | Status: DC

## 2020-07-31 MED ORDER — METOPROLOL SUCCINATE ER 25 MG PO TB24: 25.0000 mg | ORAL_TABLET | Freq: Every day | ORAL | 2 refills | Status: DC

## 2020-07-31 MED FILL — METOPROLOL SUCCINATE ER 25: 25 | 30 days supply | Qty: 30 | Fill #0

## 2020-07-31 MED FILL — HYDROCHLOROTHIAZIDE 12.5 MG: 12.5 | 90 days supply | Qty: 90 | Fill #0

## 2020-08-05 ENCOUNTER — Other Ambulatory Visit: Payer: Self-pay

## 2020-08-05 ENCOUNTER — Ambulatory Visit (INDEPENDENT_AMBULATORY_CARE_PROVIDER_SITE_OTHER): Payer: Medicaid Other | Admitting: Family Medicine

## 2020-08-05 ENCOUNTER — Ambulatory Visit: Payer: Self-pay

## 2020-08-05 VITALS — BP 116/79 | Ht 66.0 in | Wt 320.0 lb

## 2020-08-05 DIAGNOSIS — M79644 Pain in right finger(s): Secondary | ICD-10-CM

## 2020-08-05 DIAGNOSIS — M654 Radial styloid tenosynovitis [de Quervain]: Secondary | ICD-10-CM | POA: Diagnosis not present

## 2020-08-05 DIAGNOSIS — G8929 Other chronic pain: Secondary | ICD-10-CM

## 2020-08-05 DIAGNOSIS — G5601 Carpal tunnel syndrome, right upper limb: Secondary | ICD-10-CM

## 2020-08-05 HISTORY — DX: Carpal tunnel syndrome, right upper limb: G56.01

## 2020-08-05 HISTORY — DX: Radial styloid tenosynovitis (de quervain): M65.4

## 2020-08-05 NOTE — Assessment & Plan Note (Signed)
Seems what appears to be a cyst as opposed to tenosynovitis -Counseled on home exercise therapy and supportive care. -thumb spica splint. -Prednisone. -Could consider physical therapy or injection.

## 2020-08-05 NOTE — Progress Notes (Signed)
Isabella Guerrero - 29 y.o. female MRN [id number]  Date of birth: 28-Dec-1990  SUBJECTIVE:  Including CC & ROS.  No chief complaint on file.   Isabella Guerrero is a 29 y.o. female that is presenting with right thumb pain and right finger changes.  The symptoms been acute on chronic in nature.  She has a 30-month old.  Symptoms seem to be worse with repetitive motions with picking her up or doing repetitive movements.  No history of surgery.  Has tried a brace that she has previously been prescribed.   Review of Systems See HPI   HISTORY: Past Medical, Surgical, Social, and Family History Reviewed & Updated per EMR.   Pertinent Historical Findings include:  Past Medical History:  Diagnosis Date  . Asthma   . Back pain   . Constipation   . Fatigue   . Foot pain   . GDM (gestational diabetes mellitus) 12/2018  . GERD (gastroesophageal reflux disease)   . Gestational diabetes   . Hypertension   . Joint pain   . Knee pain   . Lower back pain   . Palpitations   . Prediabetes   . Shortness of breath   . Shortness of breath on exertion   . Sleep apnea     Past Surgical History:  Procedure Laterality Date  . CESAREAN SECTION N/A 03/01/2019   Procedure: CESAREAN SECTION;  Surgeon: Huel Cote, MD;  Location: [facility];  Service: Obstetrics;  Laterality: N/A;  need extra in OR and request RNFA  . DRUG INDUCED ENDOSCOPY    . FINGER SURGERY    . TONSILLECTOMY    . WISDOM TOOTH EXTRACTION      Family History  Problem Relation Age of Onset  . Diabetes Mother   . Hypertension Mother   . Heart disease Mother   . Hyperlipidemia Mother   . Thyroid disease Mother   . Obesity Mother   . Hypertension Father   . Diabetes Father   . Hyperlipidemia Father   . Heart disease Father   . Cancer Father   . Sleep apnea Father   . Obesity Father   . Esophageal cancer Maternal Uncle   . Colon cancer Maternal Grandmother     Social History   Socioeconomic History  . Marital  status: Single    Spouse name: Not on file  . Number of children: Not on file  . Years of education: Not on file  . Highest education level: Not on file  Occupational History  . Occupation: homemaker  Tobacco Use  . Smoking status: Current Every Day Smoker    Packs/day: 0.25    Types: Cigarettes    Last attempt to quit: 07/08/2018    Years since quitting: 2.0  . Smokeless tobacco: Never Used  . Tobacco comment: Currently taking medication to assist in cessation  Vaping Use  . Vaping Use: Never used  Substance and Sexual Activity  . Alcohol use: Yes    Alcohol/week: 1.0 standard drink    Types: 1 Cans of beer per week    Comment: once in a while  . Drug use: Never  . Sexual activity: Yes    Partners: Male    Birth control/protection: Implant  Other Topics Concern  . Not on file  Social History Narrative  . Not on file   Social Determinants of Health   Financial Resource Strain: Not on file  Food Insecurity: Not on file  Transportation Needs: Not on file  Physical  Activity: Not on file  Stress: Not on file  Social Connections: Not on file  Intimate Partner Violence: Not on file     PHYSICAL EXAM:  VS: BP 116/79   Ht 5\' 6"  (1.676 m)   Wt (!) 320 lb (145.2 kg)   LMP 07/25/2020   BMI 51.65 kg/m  Physical Exam Gen: NAD, alert, cooperative with exam, well-appearing MSK:  Right hand: Limitations in thumb extension and abduction. Positive Finkelstein's. No signs of atrophy. Positive Tinel's at the wrist. Normal grip strength. Neurovascular intact  Limited ultrasound: Right wrist:  No effusion joints. There appears to be an anechoic change of the first dorsal compartment which could suggest more of a cyst as opposed to tenosynovitis as there is no hyperemia. The median nerve is enlarged and flat within the carpal tunnel to suggest compression.  Summary: Findings consistent with de Quervain's and carpal tunnel disease.  Ultrasound and interpretation by 07/27/2020, MD    ASSESSMENT & PLAN:   Carpal tunnel syndrome of right wrist Has findings consistent on ultrasound with carpal tunnel disease. -Counseled on home exercise therapy and supportive care. -Prednisone. -Could consider physical therapy or injection.  De Quervain's tenosynovitis, right Seems what appears to be a cyst as opposed to tenosynovitis -Counseled on home exercise therapy and supportive care. -thumb spica splint. -Prednisone. -Could consider physical therapy or injection.

## 2020-08-05 NOTE — Assessment & Plan Note (Signed)
Has findings consistent on ultrasound with carpal tunnel disease. -Counseled on home exercise therapy and supportive care. -Prednisone. -Could consider physical therapy or injection.

## 2020-08-05 NOTE — Patient Instructions (Signed)
Nice to meet you Please try the brace  Please try the pennsaid   Please send me a message in MyChart with any questions or updates.  Please see me back in 4 weeks.   --Dr. Jordan Likes

## 2020-08-15 ENCOUNTER — Other Ambulatory Visit: Payer: Self-pay

## 2020-08-16 ENCOUNTER — Encounter: Payer: Self-pay | Admitting: Family Medicine

## 2020-08-16 ENCOUNTER — Ambulatory Visit: Payer: Medicaid Other | Admitting: Family Medicine

## 2020-08-16 VITALS — BP 120/82 | HR 87 | Temp 97.7°F | Ht 66.0 in | Wt 336.0 lb

## 2020-08-16 DIAGNOSIS — H8113 Benign paroxysmal vertigo, bilateral: Secondary | ICD-10-CM

## 2020-08-16 DIAGNOSIS — M549 Dorsalgia, unspecified: Secondary | ICD-10-CM | POA: Diagnosis not present

## 2020-08-16 DIAGNOSIS — H6593 Unspecified nonsuppurative otitis media, bilateral: Secondary | ICD-10-CM | POA: Diagnosis not present

## 2020-08-16 NOTE — Patient Instructions (Addendum)
Isabella Guerrero exercises Consider prednisone/medrol dose pack Increase flonase to 2x/day x 1 wk then back to daily Sudafed x 2-3 days Can do benadryl at bedtime  Heating pad 2-3xday, 15-95min Ibuprofen 600mg  2x/day with food x 5-7 days Range of motion/stretching exercises after heating pad once per day   Benign Positional Vertigo Vertigo is the feeling that you or your surroundings are moving when they are not. Benign positional vertigo is the most common form of vertigo. This is usually a harmless condition (benign). This condition is positional. This means that symptoms are triggered by certain movements and positions. This condition can be dangerous if it occurs while you are doing something that could cause harm to you or others. This includes activities such as driving or operating machinery. What are the causes? In many cases, the cause of this condition is not known. It may be caused by a disturbance in an area of the inner ear that helps your brain to sense movement and balance. This disturbance can be caused by:  Viral infection (labyrinthitis).  Head injury.  Repetitive motion, such as jumping, dancing, or running. What increases the risk? You are more likely to develop this condition if:  You are a woman.  You are 32 years of age or older. What are the signs or symptoms? Symptoms of this condition usually happen when you move your head or your eyes in different directions. Symptoms may start suddenly, and usually last for less than a minute. They include:  Loss of balance and falling.  Feeling like you are spinning or moving.  Feeling like your surroundings are spinning or moving.  Nausea and vomiting.  Blurred vision.  Dizziness.  Involuntary eye movement (nystagmus). Symptoms can be mild and cause only minor problems, or they can be severe and interfere with daily life. Episodes of benign positional vertigo may return (recur) over time. Symptoms may improve  over time. How is this diagnosed? This condition may be diagnosed based on:  Your medical history.  Physical exam of the head, neck, and ears.  Tests, such as: ? MRI. ? CT scan. ? Eye movement tests. Your health care provider may ask you to change positions quickly while he or she watches you for symptoms of benign positional vertigo, such as nystagmus. Eye movement may be tested with a variety of exams that are designed to evaluate or stimulate vertigo. ? An electroencephalogram (EEG). This records electrical activity in your brain. ? Hearing tests. You may be referred to a health care provider who specializes in ear, nose, and throat (ENT) problems (otolaryngologist) or a provider who specializes in disorders of the nervous system (neurologist). How is this treated?  This condition may be treated in a session in which your health care provider moves your head in specific positions to adjust your inner ear back to normal. Treatment for this condition may take several sessions. Surgery may be needed in severe cases, but this is rare. In some cases, benign positional vertigo may resolve on its own in 2-4 weeks. Follow these instructions at home: Safety  Move slowly. Avoid sudden body or head movements or certain positions, as told by your health care provider.  Avoid driving until your health care provider says it is safe for you to do so.  Avoid operating heavy machinery until your health care provider says it is safe for you to do so.  Avoid doing any tasks that would be dangerous to you or others if vertigo occurs.  If you  have trouble walking or keeping your balance, try using a cane for stability. If you feel dizzy or unstable, sit down right away.  Return to your normal activities as told by your health care provider. Ask your health care provider what activities are safe for you. General instructions  Take over-the-counter and prescription medicines only as told by your  health care provider.  Drink enough fluid to keep your urine pale yellow.  Keep all follow-up visits as told by your health care provider. This is important. Contact a health care provider if:  You have a fever.  Your condition gets worse or you develop new symptoms.  Your family or friends notice any behavioral changes.  You have nausea or vomiting that gets worse.  You have numbness or a "pins and needles" sensation. Get help right away if you:  Have difficulty speaking or moving.  Are always dizzy.  Faint.  Develop severe headaches.  Have weakness in your legs or arms.  Have changes in your hearing or vision.  Develop a stiff neck.  Develop sensitivity to light. Summary  Vertigo is the feeling that you or your surroundings are moving when they are not. Benign positional vertigo is the most common form of vertigo.  The cause of this condition is not known. It may be caused by a disturbance in an area of the inner ear that helps your brain to sense movement and balance.  Symptoms include loss of balance and falling, feeling that you or your surroundings are moving, nausea and vomiting, and blurred vision.  This condition can be diagnosed based on symptoms, physical exam, and other tests, such as MRI, CT scan, eye movement tests, and hearing tests.  Follow safety instructions as told by your health care provider. You will also be told when to contact your health care provider in case of problems. This information is not intended to replace advice given to you by your health care provider. Make sure you discuss any questions you have with your health care provider. Document Revised: 01/05/2018 Document Reviewed: 01/05/2018 Elsevier Patient Education  2020 [employer].

## 2020-08-16 NOTE — Progress Notes (Signed)
Akacia Boltz is a 30 y.o. female  Chief Complaint  Patient presents with  . Acute Visit    Patient c/o having random dizzy spells. She has been using Zyrtec and Flonase with no relief.  Average BP 110/60, 993-71     HPI: Marji Kuehnel is a 30 y.o. female who complains of have intermittent episodes of vertigo on/ff since 10/2019. Symptoms occur with bending forward, turn head too fast, turning and laying on Lt side. Symptoms last 5-30 sec. BP and HR are normal at these times - pts mom has checked. No headaches. Some stuffy nose but nothing new or worse and vertigo episodes happen both when pt is congested and when she is not.  She is drinking plenty of water Pt has been taking zyrtec and flonase w/o improvement. BP readings average 110-120/60-73. She is taking HCTZ 12.5mg  daily and metoprolol 25mg  daily.  Pt also complains of Rt mid back pain, worse with movement, deep breath. Pt is Rt handed and has a 1.5yo daughter. No radiation of pain, numbness, tingling, weakness. Pt has not taken anything for this.  Past Medical History:  Diagnosis Date  . Asthma   . Back pain   . Constipation   . Fatigue   . Foot pain   . GDM (gestational diabetes mellitus) 12/2018  . GERD (gastroesophageal reflux disease)   . Gestational diabetes   . Hypertension   . Joint pain   . Knee pain   . Lower back pain   . Obesity   . Palpitations   . Prediabetes   . Shortness of breath   . Shortness of breath on exertion   . Sleep apnea     Past Surgical History:  Procedure Laterality Date  . CESAREAN SECTION N/A 03/01/2019   Procedure: CESAREAN SECTION;  Surgeon: Paula Compton, MD;  Location: [place] [facility];  Service: Obstetrics;  Laterality: N/A;  need extra 92mins in OR and request RNFA  . DRUG INDUCED ENDOSCOPY    . ESOPHAGOGASTRODUODENOSCOPY  12/28/2011   Mild gastritis. Otherwise normal EGD  . FINGER SURGERY    . TONSILLECTOMY    . WISDOM TOOTH EXTRACTION      Social History   Socioeconomic  History  . Marital status: Single    Spouse name: Not on file  . Number of children: Not on file  . Years of education: Not on file  . Highest education level: Not on file  Occupational History  . Occupation: homemaker  Tobacco Use  . Smoking status: Current Every Day Smoker    Packs/day: 0.25    Types: Cigarettes    Last attempt to quit: 07/08/2018    Years since quitting: 2.1  . Smokeless tobacco: Never Used  . Tobacco comment: Currently taking medication to assist in cessation  Vaping Use  . Vaping Use: Never used  Substance and Sexual Activity  . Alcohol use: Yes    Alcohol/week: 1.0 standard drink    Types: 1 Cans of beer per week    Comment: once in a while  . Drug use: Never  . Sexual activity: Yes    Partners: Male    Birth control/protection: Implant  Other Topics Concern  . Not on file  Social History Narrative  . Not on file   Social Determinants of Health   Financial Resource Strain: Not on file  Food Insecurity: Not on file  Transportation Needs: Not on file  Physical Activity: Not on file  Stress: Not on file  Social  Connections: Not on file  Intimate Partner Violence: Not on file    Family History  Problem Relation Age of Onset  . Diabetes Mother   . Hypertension Mother   . Heart disease Mother   . Hyperlipidemia Mother   . Thyroid disease Mother   . Obesity Mother   . Hypertension Father   . Diabetes Father   . Hyperlipidemia Father   . Heart disease Father   . Cancer Father   . Sleep apnea Father   . Obesity Father   . Esophageal cancer Maternal Uncle   . Colon cancer Maternal Grandmother      Immunization History  Administered Date(s) Administered  . Influenza-Unspecified 07/13/2020  . PFIZER SARS-COV-2 Vaccination 08/21/2019, 09/13/2019, 07/13/2020    Outpatient Encounter Medications as of 08/16/2020  Medication Sig  . acetaminophen (TYLENOL) 500 MG tablet Take 1,000 mg by mouth as needed.  . cetirizine (ZYRTEC) 10 MG chewable  tablet Chew 10 mg by mouth daily.  . Dulaglutide (TRULICITY) 0.75 MG/0.5ML SOPN Inject 0.75 mg into the skin once a week.  . etonogestrel (NEXPLANON) 68 MG IMPL implant 1 each by Subdermal route once.  . fluticasone (FLONASE) 50 MCG/ACT nasal spray Place 1 spray into both nostrils daily.  . hydrochlorothiazide (MICROZIDE) 12.5 MG capsule Take 1 capsule (12.5 mg total) by mouth daily.  Marland Kitchen ibuprofen (ADVIL) 800 MG tablet Take 800 mg by mouth as needed.  . metoprolol succinate (TOPROL-XL) 25 MG 24 hr tablet Take 1 tablet (25 mg total) by mouth daily.  . Vitamin D, Ergocalciferol, (DRISDOL) 1.25 MG (50000 UNIT) CAPS capsule Take 1 capsule (50,000 Units total) by mouth every 7 (seven) days.   No facility-administered encounter medications on file as of 08/16/2020.     ROS: Pertinent positives and negatives noted in HPI. Remainder of ROS non-contributory   Allergies  Allergen Reactions  . Scallops [Shellfish Allergy] Anaphylaxis and Hives  . Adhesive [Tape] Rash  . Latex Rash    BP 120/82   Pulse 87   Temp 97.7 F (36.5 C) (Temporal)   Ht 5\' 6"  (1.676 m)   Wt (!) 336 lb (152.4 kg)   LMP 07/25/2020   SpO2 98%   BMI 54.23 kg/m    BP Readings from Last 3 Encounters:  08/16/20 120/82  08/05/20 116/79  07/29/20 139/83   Pulse Readings from Last 3 Encounters:  08/16/20 87  07/29/20 (!) 109  07/18/20 90   Wt Readings from Last 3 Encounters:  08/16/20 (!) 336 lb (152.4 kg)  08/05/20 (!) 320 lb (145.2 kg)  07/29/20 (!) 327 lb (148.3 kg)     Physical Exam Constitutional:      Appearance: Normal appearance. She is obese.  HENT:     Head: Normocephalic and atraumatic.     Right Ear: Ear canal and external ear normal. A middle ear effusion is present. Tympanic membrane is not erythematous or bulging.     Left Ear: Ear canal and external ear normal. A middle ear effusion is present. Tympanic membrane is not erythematous or bulging.  Eyes:     General: Lids are normal.      Extraocular Movements:     Right eye: Normal extraocular motion and no nystagmus.     Left eye: Normal extraocular motion and no nystagmus.  Musculoskeletal:     Thoracic back: Tenderness present. No swelling, deformity or bony tenderness. Normal range of motion.       Back:  Neurological:     Mental Status:  She is alert.      A/P:  1. BPPV (benign paroxysmal positional vertigo), bilateral 2. Middle ear effusion, bilateral - trial of Francee Piccolo Daroff exercises - sent via MyChart message to pt - continue with adequate hydration, slow position change - increase flonase to BID x 1 week then resume daily dosing, 2-3 days of sudafed - pt declines prednisone at this time - f/u in 3-4 wks if no/minial improvment  2. Musculoskeletal back pain - heating pad 2-3x/day - stretching/ROM exercises - ibuprofen 600mg  BID w/ food x 5-7 days - avid heavy lifting if possible - f/u in 3-4 wks if no/minial improvment  This visit occurred during the SARS-CoV-2 public health emergency.  Safety protocols were in place, including screening questions prior to the visit, additional usage of staff PPE, and extensive cleaning of exam room while observing appropriate contact time as indicated for disinfecting solutions.

## 2020-08-19 ENCOUNTER — Encounter (INDEPENDENT_AMBULATORY_CARE_PROVIDER_SITE_OTHER): Payer: Self-pay | Admitting: Family Medicine

## 2020-08-19 ENCOUNTER — Other Ambulatory Visit: Payer: Self-pay

## 2020-08-19 ENCOUNTER — Other Ambulatory Visit (INDEPENDENT_AMBULATORY_CARE_PROVIDER_SITE_OTHER): Payer: Self-pay | Admitting: Family Medicine

## 2020-08-19 ENCOUNTER — Ambulatory Visit (INDEPENDENT_AMBULATORY_CARE_PROVIDER_SITE_OTHER): Payer: Medicaid Other | Admitting: Family Medicine

## 2020-08-19 VITALS — BP 132/83 | HR 105 | Temp 98.8°F | Ht 66.0 in | Wt 325.0 lb

## 2020-08-19 DIAGNOSIS — E1165 Type 2 diabetes mellitus with hyperglycemia: Secondary | ICD-10-CM | POA: Diagnosis not present

## 2020-08-19 DIAGNOSIS — E559 Vitamin D deficiency, unspecified: Secondary | ICD-10-CM | POA: Diagnosis not present

## 2020-08-19 DIAGNOSIS — Z6841 Body Mass Index (BMI) 40.0 and over, adult: Secondary | ICD-10-CM | POA: Diagnosis not present

## 2020-08-19 MED ORDER — VITAMIN D (ERGOCALCIFEROL) 1.25 MG (50000 UNIT) PO CAPS: 50000.0000 [IU] | ORAL_CAPSULE | ORAL | 0 refills | Status: DC

## 2020-08-19 MED FILL — VIT D2 1.25 MG (50,000 UNIT: 1.25 MG | 28 days supply | Qty: 4 | Fill #0

## 2020-08-21 NOTE — Progress Notes (Signed)
Chief Complaint:   OBESITY Isabella Guerrero is here to discuss her progress with her obesity treatment plan along with follow-up of her obesity related diagnoses. Isabella Guerrero is on the Category 4 Plan and states she is following her eating plan approximately 80% of the time. Isabella Guerrero states she is not exercising.  Today's visit was #: 6 Starting weight: 344 lbs Starting date: 06/03/2020 Today's weight: 325 lbs Today's date: 08/19/2020 Total lbs lost to date: 19 lbs Total lbs lost since last in-office visit: 0  Interim History: Isabella Guerrero is watching her sister's baby and her baby currently. She is trying her best to follow plan even with addition of another child. Isabella Guerrero saw Dr Carloyn Manner for vertigo(patient was passing out). She doesn't always follow meal plan as strictly as she wants to due to family food choices. She has started Trulicity.  Subjective:   1. Vitamin D deficiency Isabella Guerrero's last Vitamin D was 20.8. She denies nausea, vomiting, muscle weakness, but endorses fatigue.  2. Type 2 diabetes mellitus with hyperglycemia, without long-term current use of insulin (HCC) Isabella Guerrero is on Trulicity and is noticing a decrease in appetite. No GI side effects.    Assessment/Plan:   1. Vitamin D deficiency We will refill Vitamin D 50,000 IU/w #4. No refill. - Vitamin D, Ergocalciferol, (DRISDOL) 1.25 MG (50000 UNIT) CAPS capsule; Take 1 capsule (50,000 Units total) by mouth every 7 (seven) days.  Dispense: 4 capsule; Refill: 0  2. Type 2 diabetes mellitus with hyperglycemia, without long-term current use of insulin (HCC) Isabella Guerrero will continue Trulicity.  3. Class 3 severe obesity with serious comorbidity and body mass index (BMI) of 50.0 to 59.9 in adult, unspecified obesity type (HCC)  Isabella Guerrero is currently in the action stage of change. As such, her goal is to continue with weight loss efforts. She has agreed to the Category 4 Plan.   Exercise goals: No exercise has been prescribed at this  time.  Behavioral modification strategies: increasing lean protein intake, meal planning and cooking strategies, keeping healthy foods in the home and planning for success.  Isabella Guerrero has agreed to follow-up with our clinic in 2 weeks. She was informed of the importance of frequent follow-up visits to maximize her success with intensive lifestyle modifications for her multiple health conditions.     Objective:   Blood pressure 132/83, pulse (!) 105, temperature 98.8 F (37.1 C), temperature source Oral, height 5\' 6"  (1.676 m), weight (!) 325 lb (147.4 kg), last menstrual period 05/09/2020, SpO2 96 %, unknown if currently breastfeeding. Body mass index is 52.46 kg/m.  General: Cooperative, alert, well developed, in no acute distress. HEENT: Conjunctivae and lids unremarkable. Cardiovascular: Regular rhythm.  Lungs: Normal work of breathing. Neurologic: No focal deficits.   Lab Results  Component Value Date   CREATININE 0.64 06/03/2020   BUN 11 06/03/2020   NA 139 06/03/2020   K 4.8 06/03/2020   CL 98 06/03/2020   CO2 28 06/03/2020   Lab Results  Component Value Date   ALT 16 06/03/2020   AST 18 06/03/2020   ALKPHOS 78 06/03/2020   BILITOT 0.2 06/03/2020   Lab Results  Component Value Date   HGBA1C 6.5 (H) 06/03/2020   Lab Results  Component Value Date   INSULIN 18.4 06/03/2020   Lab Results  Component Value Date   TSH 3.000 06/03/2020   Lab Results  Component Value Date   CHOL 224 (H) 06/03/2020   HDL 33 (L) 06/03/2020   LDLCALC 149 (H) 06/03/2020  TRIG 228 (H) 06/03/2020   Lab Results  Component Value Date   WBC 11.7 (H) 06/03/2020   HGB 13.5 06/03/2020   HCT 42.7 06/03/2020   MCV 85 06/03/2020   PLT 334 06/03/2020   No results found for: IRON, TIBC, FERRITIN   Attestation Statements:   Reviewed by clinician on day of visit: allergies, medications, problem list, medical history, surgical history, family history, social history, and previous encounter  notes.  I, Delorse Limber, am acting as transcriptionist for Reuben Likes, MD.  I have reviewed the above documentation for accuracy and completeness, and I agree with the above. - [name] Mires, MD

## 2020-08-30 ENCOUNTER — Encounter: Payer: Self-pay | Admitting: Family Medicine

## 2020-08-30 ENCOUNTER — Encounter: Payer: Self-pay | Admitting: Gastroenterology

## 2020-08-30 ENCOUNTER — Ambulatory Visit (INDEPENDENT_AMBULATORY_CARE_PROVIDER_SITE_OTHER): Payer: Medicaid Other | Admitting: Gastroenterology

## 2020-08-30 ENCOUNTER — Other Ambulatory Visit: Payer: Self-pay

## 2020-08-30 ENCOUNTER — Other Ambulatory Visit: Payer: Self-pay | Admitting: Family Medicine

## 2020-08-30 VITALS — BP 118/62 | HR 99 | Ht 66.0 in | Wt 334.5 lb

## 2020-08-30 DIAGNOSIS — K219 Gastro-esophageal reflux disease without esophagitis: Secondary | ICD-10-CM

## 2020-08-30 DIAGNOSIS — I10 Essential (primary) hypertension: Secondary | ICD-10-CM

## 2020-08-30 DIAGNOSIS — K625 Hemorrhage of anus and rectum: Secondary | ICD-10-CM | POA: Diagnosis not present

## 2020-08-30 MED ORDER — METOPROLOL SUCCINATE ER 25 MG PO TB24: 25.0000 mg | ORAL_TABLET | Freq: Every day | ORAL | 1 refills | Status: DC

## 2020-08-30 MED ORDER — PANTOPRAZOLE SODIUM 20 MG PO TBEC: 20.0000 mg | DELAYED_RELEASE_TABLET | Freq: Every day | ORAL | 11 refills | Status: DC

## 2020-08-30 MED ORDER — HYDROCHLOROTHIAZIDE 12.5 MG PO CAPS: 12.5000 mg | ORAL_CAPSULE | Freq: Every day | ORAL | 1 refills | Status: DC

## 2020-08-30 MED FILL — HYDROCHLOROTHIAZIDE 12.5 MG: 12.5 | 90 days supply | Qty: 90 | Fill #0

## 2020-08-30 MED FILL — METOPROLOL SUCCINATE ER 25: 25 | 90 days supply | Qty: 90 | Fill #0

## 2020-08-30 NOTE — H&P (View-Only) (Signed)
Chief Complaint: For GI WU  Referring Provider:  Overton Mam, DO      ASSESSMENT AND PLAN;   #1. Rectal bleeding. D/d hoids, AVMs, colitis, polyps, stercoral ulcers etc, r/o colonic neoplasms or IBD. Nl Hb as below  #2. GERD with N/V  #3. FH colon cancer dad at age 32 with Stage IV.  #4. IBS-D. R/O other causes.   Plan: - EGD/Colon at [facility] Sep 16, 2020.  - Protonix 20mg  po qd #30, 11 refills - Avoid nonsteroidals. - Encouraged her to start exercising and reduce weight.  She is also being followed at [facility].   Discussed risks & benefits. Risks including rare perforation req laparotomy, bleeding after bx/polypectomy req blood transfusion, rarely missing neoplasms, risks of anesthesia/sedation. Benefits outweigh the risks. Patient agrees to proceed. All the questions were answered. Consent forms given for review.   HPI:    Isabella Guerrero is a 30 y.o. female  With multiple comorbid conditions including morbid obesity (BMI>50), DM2, OSA, GERD, HTN   With intermittent rectal bleeding-had a large clot which was dark in color.  At times bright red blood.  This is associated with intermittent diarrhea at the frequency of 2-3 BMs/day.  She also has lower abdominal crampy pain and bloating which does get better with defecation.  No significant constipation.  No nocturnal symptoms.  Has strong family history of colon cancer-father with stage IV colon cancer at age 64.  Her maternal grandmother also had colon cancer.  She also has significant reflux in form of heartburn x over 10 years, regurgitation and waterbrash which was worse during pregnancy.  It continues despite of quitting smoking November 2019.  She did feel better when she was on Protonix.  Omeprazole never helped her.  N/V if she doesn't eat.  No odynophagia or dysphagia.  No sodas, chocolates, chewing gums, artificial sweeteners and candy. No NSAIDs  No jaundice dark urine or pale stools.   No weight loss.  SH- 3 month old daughter  Past GI work-up: 15 11/2011: Limited examination due to body habitus. Neg celiac screen 2013 EGD 12/2011: Mild gastritis. Neg SB Bx for celiac. Past Medical History:  Diagnosis Date  . Asthma   . Guerrero pain   . Constipation   . Fatigue   . Foot pain   . GDM (gestational diabetes mellitus) 12/2018  . GERD (gastroesophageal reflux disease)   . Gestational diabetes   . Hypertension   . Joint pain   . Knee pain   . Lower Guerrero pain   . Obesity   . Palpitations   . Prediabetes   . Shortness of breath   . Shortness of breath on exertion   . Sleep apnea     Past Surgical History:  Procedure Laterality Date  . CESAREAN SECTION N/A 03/01/2019   Procedure: CESAREAN SECTION;  Surgeon: 03/03/2019, MD;  Location: [facility]S;  Service: Obstetrics;  Laterality: N/A;  need extra Huel Cote in OR and request RNFA  . DRUG INDUCED ENDOSCOPY    . ESOPHAGOGASTRODUODENOSCOPY  12/28/2011   Mild gastritis. Otherwise normal EGD  . FINGER SURGERY    . TONSILLECTOMY    . WISDOM TOOTH EXTRACTION      Family History  Problem Relation Age of Onset  . Diabetes Mother   . Hypertension Mother   . Heart disease Mother   . Hyperlipidemia Mother   . Thyroid disease Mother   . Obesity Mother   . Hypertension  Father   . Diabetes Father   . Hyperlipidemia Father   . Heart disease Father   . Cancer Father   . Sleep apnea Father   . Obesity Father   . Colon cancer Father        stage 4   . Esophageal cancer Maternal Uncle   . Colon cancer Maternal Grandmother     Social History   Tobacco Use  . Smoking status: Current Every Day Smoker    Packs/day: 0.25    Types: Cigarettes    Last attempt to quit: 07/08/2018    Years since quitting: 2.1  . Smokeless tobacco: Never Used  . Tobacco comment: Currently taking medication to assist in cessation  Vaping Use  . Vaping Use: Never used  Substance Use Topics  . Alcohol use: Not Currently     Alcohol/week: 1.0 standard drink    Types: 1 Cans of beer per week    Comment: once in a while  . Drug use: Never    Current Outpatient Medications  Medication Sig Dispense Refill  . acetaminophen (TYLENOL) 500 MG tablet Take 1,000 mg by mouth as needed.    . cetirizine (ZYRTEC) 10 MG chewable tablet Chew 10 mg by mouth daily.    . Dulaglutide (TRULICITY) 0.75 MG/0.5ML SOPN Inject 0.75 mg into the skin once a week. 2 mL 0  . etonogestrel (NEXPLANON) 68 MG IMPL implant 1 each by Subdermal route once. In Left Arm    . fluticasone (FLONASE) 50 MCG/ACT nasal spray Place 1 spray into both nostrils daily.    . hydrochlorothiazide (MICROZIDE) 12.5 MG capsule Take 1 capsule (12.5 mg total) by mouth daily. 90 capsule 1  . ibuprofen (ADVIL) 800 MG tablet Take 800 mg by mouth as needed.    . metoprolol succinate (TOPROL-XL) 25 MG 24 hr tablet Take 1 tablet (25 mg total) by mouth daily. 90 tablet 1  . Pseudoephedrine HCl (SUDAFED 24 HOUR PO) Take by mouth.    . Vitamin D, Ergocalciferol, (DRISDOL) 1.25 MG (50000 UNIT) CAPS capsule Take 1 capsule (50,000 Units total) by mouth every 7 (seven) days. 4 capsule 0   No current facility-administered medications for this visit.    Allergies  Allergen Reactions  . Scallops [Shellfish Allergy] Anaphylaxis and Hives  . Adhesive [Tape] Rash  . Latex Rash    Review of Systems:  Constitutional: Denies fever, chills, diaphoresis, appetite change and fatigue.  HEENT: Denies photophobia, eye pain, redness, hearing loss, ear pain, congestion, sore throat, rhinorrhea, sneezing, mouth sores, neck pain, neck stiffness and tinnitus.   Respiratory: Denies SOB, DOE, cough, chest tightness,  and wheezing.   Cardiovascular: Denies chest pain, palpitations and leg swelling.  Genitourinary: Denies dysuria, urgency, frequency, hematuria, flank pain and difficulty urinating.  Musculoskeletal: Denies myalgias, Guerrero pain, joint swelling, arthralgias and gait problem.  Skin:  No rash.  Neurological: Denies dizziness, seizures, syncope, weakness, light-headedness, numbness and headaches.  Hematological: Denies adenopathy. Easy bruising, personal or family bleeding history  Psychiatric/Behavioral: Has anxiety or depression     Physical Exam:    BP 118/62   Pulse 99   Ht 5\' 6"  (1.676 m)   Wt (!) 334 lb 8 oz (151.7 kg)   BMI 53.99 kg/m  Wt Readings from Last 3 Encounters:  08/30/20 (!) 334 lb 8 oz (151.7 kg)  08/19/20 (!) 325 lb (147.4 kg)  08/16/20 (!) 336 lb (152.4 kg)   Constitutional:  Well-developed, in no acute distress. Psychiatric: Normal mood and affect.  Behavior is normal. HEENT: Pupils normal.  Conjunctivae are normal. No scleral icterus. Neck supple.  Cardiovascular: Normal rate, regular rhythm. No edema Pulmonary/chest: Effort normal and breath sounds normal. No wheezing, rales or rhonchi. Abdominal: Soft, nondistended. Nontender. Bowel sounds active throughout. There are no masses palpable. No hepatomegaly. Rectal: Deferred Neurological: Alert and oriented to person place and time. Skin: Skin is warm and dry. No rashes noted.  Data Reviewed: I have personally reviewed following labs and imaging studies  CBC: CBC Latest Ref Rng & Units 06/03/2020 03/02/2019 03/01/2019  WBC 3.4 - 10.8 x10E3/uL 11.7(H) 14.1(H) 15.6(H)  Hemoglobin 11.1 - 15.9 g/dL 13.5 8.5(L) 12.1  Hematocrit 34.0 - 46.6 % 42.7 26.7(L) 38.5  Platelets 150 - 450 x10E3/uL 334 304 400    CMP: CMP Latest Ref Rng & Units 06/03/2020 03/01/2019  Glucose 65 - 99 mg/dL 81 120(H)  BUN 6 - 20 mg/dL 11 11  Creatinine 0.57 - 1.00 mg/dL 0.64 0.63  Sodium 134 - 144 mmol/L 139 136  Potassium 3.5 - 5.2 mmol/L 4.8 4.5  Chloride 96 - 106 mmol/L 98 104  CO2 20 - 29 mmol/L 28 23  Calcium 8.7 - 10.2 mg/dL 9.6 9.4  Total Protein 6.0 - 8.5 g/dL 7.1 -  Total Bilirubin 0.0 - 1.2 mg/dL 0.2 -  Alkaline Phos 44 - 121 IU/L 78 -  AST 0 - 40 IU/L 18 -  ALT 0 - 32 IU/L 16 -    GFR: CrCl  cannot be calculated (Patient's most recent lab result is older than the maximum 21 days allowed.). Liver Function Tests: No results for input(s): AST, ALT, ALKPHOS, BILITOT, PROT, ALBUMIN in the last 168 hours. No results for input(s): LIPASE, AMYLASE in the last 168 hours. No results for input(s): AMMONIA in the last 168 hours. Coagulation Profile: No results for input(s): INR, PROTIME in the last 168 hours. HbA1C: No results for input(s): HGBA1C in the last 72 hours. Lipid Profile: No results for input(s): CHOL, HDL, LDLCALC, TRIG, CHOLHDL, LDLDIRECT in the last 72 hours. Thyroid Function Tests: No results for input(s): TSH, T4TOTAL, FREET4, T3FREE, THYROIDAB in the last 72 hours. Anemia Panel: No results for input(s): VITAMINB12, FOLATE, FERRITIN, TIBC, IRON, RETICCTPCT in the last 72 hours.  No results found for this or any previous visit (from the past 240 hour(s)).    Radiology Studies: [place] LIMITED JOINT SPACE STRUCTURES UP RIGHT  Result Date: 08/07/2020 Limited ultrasound: Right wrist:  No effusion joints. There appears to be an anechoic change of the first dorsal compartment which could suggest more of a cyst as opposed to tenosynovitis as there is no hyperemia. The median nerve is enlarged and flat within the carpal tunnel to suggest compression.  Summary: Findings consistent with de Quervain's and carpal tunnel disease.  Ultrasound and interpretation by Jeremy Schmitz, MD     Raj Breawna Montenegro, MD 08/30/2020, 10:29 AM    

## 2020-08-30 NOTE — Progress Notes (Signed)
Chief Complaint: For GI WU  Referring Provider:  Overton Mam, DO      ASSESSMENT AND PLAN;   #1. Rectal bleeding. D/d hoids, AVMs, colitis, polyps, stercoral ulcers etc, r/o colonic neoplasms or IBD. Nl Hb as below  #2. GERD with N/V  #3. FH colon cancer dad at age 32 with Stage IV.  #4. IBS-D. R/O other causes.   Plan: - EGD/Colon at [facility] Sep 16, 2020.  - Protonix 20mg  po qd #30, 11 refills - Avoid nonsteroidals. - Encouraged her to start exercising and reduce weight.  She is also being followed at [facility].   Discussed risks & benefits. Risks including rare perforation req laparotomy, bleeding after bx/polypectomy req blood transfusion, rarely missing neoplasms, risks of anesthesia/sedation. Benefits outweigh the risks. Patient agrees to proceed. All the questions were answered. Consent forms given for review.   HPI:    Isabella Guerrero is a 30 y.o. female  With multiple comorbid conditions including morbid obesity (BMI>50), DM2, OSA, GERD, HTN   With intermittent rectal bleeding-had a large clot which was dark in color.  At times bright red blood.  This is associated with intermittent diarrhea at the frequency of 2-3 BMs/day.  She also has lower abdominal crampy pain and bloating which does get better with defecation.  No significant constipation.  No nocturnal symptoms.  Has strong family history of colon cancer-father with stage IV colon cancer at age 64.  Her maternal grandmother also had colon cancer.  She also has significant reflux in form of heartburn x over 10 years, regurgitation and waterbrash which was worse during pregnancy.  It continues despite of quitting smoking November 2019.  She did feel better when she was on Protonix.  Omeprazole never helped her.  N/V if she doesn't eat.  No odynophagia or dysphagia.  No sodas, chocolates, chewing gums, artificial sweeteners and candy. No NSAIDs  No jaundice dark urine or pale stools.   No weight loss.  SH- 3 month old daughter  Past GI work-up: 15 11/2011: Limited examination due to body habitus. Neg celiac screen 2013 EGD 12/2011: Mild gastritis. Neg SB Bx for celiac. Past Medical History:  Diagnosis Date  . Asthma   . Guerrero pain   . Constipation   . Fatigue   . Foot pain   . GDM (gestational diabetes mellitus) 12/2018  . GERD (gastroesophageal reflux disease)   . Gestational diabetes   . Hypertension   . Joint pain   . Knee pain   . Lower Guerrero pain   . Obesity   . Palpitations   . Prediabetes   . Shortness of breath   . Shortness of breath on exertion   . Sleep apnea     Past Surgical History:  Procedure Laterality Date  . CESAREAN SECTION N/A 03/01/2019   Procedure: CESAREAN SECTION;  Surgeon: 03/03/2019, MD;  Location: [facility];  Service: Obstetrics;  Laterality: N/A;  need extra Huel Cote in OR and request RNFA  . DRUG INDUCED ENDOSCOPY    . ESOPHAGOGASTRODUODENOSCOPY  12/28/2011   Mild gastritis. Otherwise normal EGD  . FINGER SURGERY    . TONSILLECTOMY    . WISDOM TOOTH EXTRACTION      Family History  Problem Relation Age of Onset  . Diabetes Mother   . Hypertension Mother   . Heart disease Mother   . Hyperlipidemia Mother   . Thyroid disease Mother   . Obesity Mother   . Hypertension  Father   . Diabetes Father   . Hyperlipidemia Father   . Heart disease Father   . Cancer Father   . Sleep apnea Father   . Obesity Father   . Colon cancer Father        stage 4   . Esophageal cancer Maternal Uncle   . Colon cancer Maternal Grandmother     Social History   Tobacco Use  . Smoking status: Current Every Day Smoker    Packs/day: 0.25    Types: Cigarettes    Last attempt to quit: 07/08/2018    Years since quitting: 2.1  . Smokeless tobacco: Never Used  . Tobacco comment: Currently taking medication to assist in cessation  Vaping Use  . Vaping Use: Never used  Substance Use Topics  . Alcohol use: Not Currently     Alcohol/week: 1.0 standard drink    Types: 1 Cans of beer per week    Comment: once in a while  . Drug use: Never    Current Outpatient Medications  Medication Sig Dispense Refill  . acetaminophen (TYLENOL) 500 MG tablet Take 1,000 mg by mouth as needed.    . cetirizine (ZYRTEC) 10 MG chewable tablet Chew 10 mg by mouth daily.    . Dulaglutide (TRULICITY) 0.75 MG/0.5ML SOPN Inject 0.75 mg into the skin once a week. 2 mL 0  . etonogestrel (NEXPLANON) 68 MG IMPL implant 1 each by Subdermal route once. In Left Arm    . fluticasone (FLONASE) 50 MCG/ACT nasal spray Place 1 spray into both nostrils daily.    . hydrochlorothiazide (MICROZIDE) 12.5 MG capsule Take 1 capsule (12.5 mg total) by mouth daily. 90 capsule 1  . ibuprofen (ADVIL) 800 MG tablet Take 800 mg by mouth as needed.    . metoprolol succinate (TOPROL-XL) 25 MG 24 hr tablet Take 1 tablet (25 mg total) by mouth daily. 90 tablet 1  . Pseudoephedrine HCl (SUDAFED 24 HOUR PO) Take by mouth.    . Vitamin D, Ergocalciferol, (DRISDOL) 1.25 MG (50000 UNIT) CAPS capsule Take 1 capsule (50,000 Units total) by mouth every 7 (seven) days. 4 capsule 0   No current facility-administered medications for this visit.    Allergies  Allergen Reactions  . Scallops [Shellfish Allergy] Anaphylaxis and Hives  . Adhesive [Tape] Rash  . Latex Rash    Review of Systems:  Constitutional: Denies fever, chills, diaphoresis, appetite change and fatigue.  HEENT: Denies photophobia, eye pain, redness, hearing loss, ear pain, congestion, sore throat, rhinorrhea, sneezing, mouth sores, neck pain, neck stiffness and tinnitus.   Respiratory: Denies SOB, DOE, cough, chest tightness,  and wheezing.   Cardiovascular: Denies chest pain, palpitations and leg swelling.  Genitourinary: Denies dysuria, urgency, frequency, hematuria, flank pain and difficulty urinating.  Musculoskeletal: Denies myalgias, Guerrero pain, joint swelling, arthralgias and gait problem.  Skin:  No rash.  Neurological: Denies dizziness, seizures, syncope, weakness, light-headedness, numbness and headaches.  Hematological: Denies adenopathy. Easy bruising, personal or family bleeding history  Psychiatric/Behavioral: Has anxiety or depression     Physical Exam:    BP 118/62   Pulse 99   Ht 5\' 6"  (1.676 m)   Wt (!) 334 lb 8 oz (151.7 kg)   BMI 53.99 kg/m  Wt Readings from Last 3 Encounters:  08/30/20 (!) 334 lb 8 oz (151.7 kg)  08/19/20 (!) 325 lb (147.4 kg)  08/16/20 (!) 336 lb (152.4 kg)   Constitutional:  Well-developed, in no acute distress. Psychiatric: Normal mood and affect.  Behavior is normal. HEENT: Pupils normal.  Conjunctivae are normal. No scleral icterus. Neck supple.  Cardiovascular: Normal rate, regular rhythm. No edema Pulmonary/chest: Effort normal and breath sounds normal. No wheezing, rales or rhonchi. Abdominal: Soft, nondistended. Nontender. Bowel sounds active throughout. There are no masses palpable. No hepatomegaly. Rectal: Deferred Neurological: Alert and oriented to person place and time. Skin: Skin is warm and dry. No rashes noted.  Data Reviewed: I have personally reviewed following labs and imaging studies  CBC: CBC Latest Ref Rng & Units 06/03/2020 03/02/2019 03/01/2019  WBC 3.4 - 10.8 x10E3/uL 11.7(H) 14.1(H) 15.6(H)  Hemoglobin 11.1 - 15.9 g/dL 56.2 1.3(Y) 86.5  Hematocrit 34.0 - 46.6 % 42.7 26.7(L) 38.5  Platelets 150 - 450 x10E3/uL 334 304 400    CMP: CMP Latest Ref Rng & Units 06/03/2020 03/01/2019  Glucose 65 - 99 mg/dL 81 784(O)  BUN 6 - 20 mg/dL 11 11  Creatinine 9.62 - 1.00 mg/dL 9.52 8.41  Sodium 324 - 144 mmol/L 139 136  Potassium 3.5 - 5.2 mmol/L 4.8 4.5  Chloride 96 - 106 mmol/L 98 104  CO2 20 - 29 mmol/L 28 23  Calcium 8.7 - 10.2 mg/dL 9.6 9.4  Total Protein 6.0 - 8.5 g/dL 7.1 -  Total Bilirubin 0.0 - 1.2 mg/dL 0.2 -  Alkaline Phos 44 - 121 IU/L 78 -  AST 0 - 40 IU/L 18 -  ALT 0 - 32 IU/L 16 -    GFR: CrCl  cannot be calculated (Patient's most recent lab result is older than the maximum 21 days allowed.). Liver Function Tests: No results for input(s): AST, ALT, ALKPHOS, BILITOT, PROT, ALBUMIN in the last 168 hours. No results for input(s): LIPASE, AMYLASE in the last 168 hours. No results for input(s): AMMONIA in the last 168 hours. Coagulation Profile: No results for input(s): INR, PROTIME in the last 168 hours. HbA1C: No results for input(s): HGBA1C in the last 72 hours. Lipid Profile: No results for input(s): CHOL, HDL, LDLCALC, TRIG, CHOLHDL, LDLDIRECT in the last 72 hours. Thyroid Function Tests: No results for input(s): TSH, T4TOTAL, FREET4, T3FREE, THYROIDAB in the last 72 hours. Anemia Panel: No results for input(s): VITAMINB12, FOLATE, FERRITIN, TIBC, IRON, RETICCTPCT in the last 72 hours.  No results found for this or any previous visit (from the past 240 hour(s)).    Radiology Studies: [place] LIMITED JOINT SPACE STRUCTURES UP RIGHT  Result Date: 08/07/2020 Limited ultrasound: Right wrist:  No effusion joints. There appears to be an anechoic change of the first dorsal compartment which could suggest more of a cyst as opposed to tenosynovitis as there is no hyperemia. The median nerve is enlarged and flat within the carpal tunnel to suggest compression.  Summary: Findings consistent with de Quervain's and carpal tunnel disease.  Ultrasound and interpretation by Clare Gandy, MD     Edman Circle, MD 08/30/2020, 10:29 AM

## 2020-08-30 NOTE — Patient Instructions (Signed)
If you are age 32 or older, your body mass index should be between 23-30. Your Body mass index is 53.99 kg/m. If this is out of the aforementioned range listed, please consider follow up with your Primary Care Provider.  If you are age 5 or younger, your body mass index should be between 19-25. Your Body mass index is 53.99 kg/m. If this is out of the aformentioned range listed, please consider follow up with your Primary Care Provider.   You have been scheduled for an endoscopy and colonoscopy. Please follow the written instructions given to you at your visit today. Please pick up your prep supplies at the pharmacy within the next 1-3 days. If you use inhalers (even only as needed), please bring them with you on the day of your procedure.  START Pantoprazole 20 mg 1 tablet daily.  Follow up pending the results of your colonoscopy/endoscopy or as needed.  Thank you for entrusting me with your care and choosing [facility].  Dr Lynann Bologna

## 2020-09-03 ENCOUNTER — Other Ambulatory Visit (INDEPENDENT_AMBULATORY_CARE_PROVIDER_SITE_OTHER): Payer: Self-pay | Admitting: Family Medicine

## 2020-09-03 ENCOUNTER — Other Ambulatory Visit: Payer: Self-pay

## 2020-09-03 ENCOUNTER — Encounter (INDEPENDENT_AMBULATORY_CARE_PROVIDER_SITE_OTHER): Payer: Self-pay | Admitting: Family Medicine

## 2020-09-03 ENCOUNTER — Ambulatory Visit (INDEPENDENT_AMBULATORY_CARE_PROVIDER_SITE_OTHER): Payer: Medicaid Other | Admitting: Family Medicine

## 2020-09-03 VITALS — BP 137/83 | HR 100 | Temp 98.5°F | Ht 66.0 in | Wt 331.0 lb

## 2020-09-03 DIAGNOSIS — Z6841 Body Mass Index (BMI) 40.0 and over, adult: Secondary | ICD-10-CM

## 2020-09-03 DIAGNOSIS — E559 Vitamin D deficiency, unspecified: Secondary | ICD-10-CM | POA: Diagnosis not present

## 2020-09-03 DIAGNOSIS — E1165 Type 2 diabetes mellitus with hyperglycemia: Secondary | ICD-10-CM | POA: Diagnosis not present

## 2020-09-03 MED ORDER — VITAMIN D (ERGOCALCIFEROL) 1.25 MG (50000 UNIT) PO CAPS: 50000.0000 [IU] | ORAL_CAPSULE | ORAL | 0 refills | Status: DC

## 2020-09-03 MED ORDER — TRULICITY 0.75 MG/0.5ML ~~LOC~~ SOAJ: 0.7500 mg | SUBCUTANEOUS | 0 refills | Status: DC

## 2020-09-03 MED FILL — TRULICITY 0.75 MG/0.5 ML PE: 0.75 | 28 days supply | Qty: 2 | Fill #0

## 2020-09-04 NOTE — Progress Notes (Signed)
Chief Complaint:   OBESITY Isabella Guerrero is here to discuss her progress with her obesity treatment plan along with follow-up of her obesity related diagnoses. Isabella Guerrero is on the [employer] and states she is following her eating plan approximately 30-40% of the time. Isabella Guerrero states she is not exercising.  Today's visit was #: 7 Starting weight: 344 lbs Starting date: 06/03/2020 Today's weight: 331 lbs Today's date: 09/03/2020 Total lbs lost to date: 13 lbs Total lbs lost since last in-office visit: 0  Interim History: Isabella Guerrero is feeling little stress from experiencing hematochezia and has colonoscopy on February 7th (prep starts the 5th). She has been doing some comfort eating. Isabella Guerrero's father has advanced colorectal cancer. Isabella Guerrero wants to get back to Cat 4 plan. She is also planning on getting a weight bench for a room in her house, and getting a peloton. Food not in house for Cat 4 yet.  Subjective:   1. Type 2 diabetes mellitus with hyperglycemia, without long-term current use of insulin (HCC)  Isabella Guerrero is having no feelings of hypoglycemia. She is on Trulicity 0.75 mg. Isabella Guerrero's last A1c was 6.5 and insulin 18.4.  2.Vitamin D deficiency Isabella Guerrero's last Vitamin D level was 20.8. She is on prescription Vitamin D. Isabella Guerrero denies nausea, vomiting, or muscle weakness but endorses fatigue.     Assessment/Plan:   1. Type 2 diabetes mellitus with hyperglycemia, without long-term current use of insulin (HCC) Good blood sugar control is important to decrease the likelihood of diabetic complications such as nephropathy, neuropathy, limb loss, blindness, coronary artery disease, and death. Intensive lifestyle modification including diet, exercise and weight loss are the first line of treatment for diabetes. We will refill Trulicity 0.75 mg weekly.  - Isabella Guerrero (TRULICITY) 0.75 MG/0.5ML SOPN; Inject 0.75 mg into the skin once a week.  Dispense: 2 mL; Refill: 0  2. Vitamin D deficiency Low  Vitamin D level contributes to fatigue and are associated with obesity, breast, and colon cancer. Isabella Guerrero agrees to continue to take prescription Vitamin D @50 ,000 IU every week and will follow-up for routine testing of Vitamin D, at least 2-3 times per year to avoid over-replacement. Will refill Vitamin D 50,00 unit weekly #4 no refill.  - Vitamin D, Ergocalciferol, (DRISDOL) 1.25 MG (50000 UNIT) CAPS capsule; Take 1 capsule (50,000 Units total) by mouth every 7 (seven) days.  Dispense: 4 capsule; Refill: 0  3. Class 3 severe obesity with serious comorbidity and body mass index (BMI) of 50.0 to 59.9 in adult, unspecified obesity type (HCC)  Isabella Guerrero is currently in the action stage of change. As such, her goal is to continue with weight loss efforts. She has agreed to the Category 4 Plan.   Exercise goals: No exercise has been prescribed at this time.  Behavioral modification strategies: increasing lean protein intake, meal planning and cooking strategies, keeping healthy foods in the home and planning for success.  Isabella Guerrero has agreed to follow-up with our clinic in 2-3 weeks. She was informed of the importance of frequent follow-up visits to maximize her success with intensive lifestyle modifications for her multiple health conditions.     Objective:   Blood pressure 137/83, pulse 100, temperature 98.5 F (36.9 C), temperature source Oral, height 5\' 6"  (1.676 m), weight (!) 331 lb (150.1 kg), last menstrual period 09/02/2020, SpO2 96 %, unknown if currently breastfeeding. Body mass index is 53.42 kg/m.  General: Cooperative, alert, well developed, in no acute distress. HEENT: Conjunctivae and lids unremarkable. Cardiovascular: Regular rhythm.  Lungs: Normal work of breathing. Neurologic: No focal deficits.   Lab Results  Component Value Date   CREATININE 0.64 06/03/2020   BUN 11 06/03/2020   NA 139 06/03/2020   K 4.8 06/03/2020   CL 98 06/03/2020   CO2 28 06/03/2020   Lab Results   Component Value Date   ALT 16 06/03/2020   AST 18 06/03/2020   ALKPHOS 78 06/03/2020   BILITOT 0.2 06/03/2020   Lab Results  Component Value Date   HGBA1C 6.5 (H) 06/03/2020   Lab Results  Component Value Date   INSULIN 18.4 06/03/2020   Lab Results  Component Value Date   TSH 3.000 06/03/2020   Lab Results  Component Value Date   CHOL 224 (H) 06/03/2020   HDL 33 (L) 06/03/2020   LDLCALC 149 (H) 06/03/2020   TRIG 228 (H) 06/03/2020   Lab Results  Component Value Date   WBC 11.7 (H) 06/03/2020   HGB 13.5 06/03/2020   HCT 42.7 06/03/2020   MCV 85 06/03/2020   PLT 334 06/03/2020   No results found for: IRON, TIBC, FERRITIN    Attestation Statements:   Reviewed by clinician on day of visit: allergies, medications, problem list, medical history, surgical history, family history, social history, and previous encounter notes.    I, Delorse Limber, am acting as transcriptionist for Reuben Likes, MD. I have reviewed the above documentation for accuracy and completeness, and I agree with the above. - [name] Mires, MD

## 2020-09-05 ENCOUNTER — Ambulatory Visit: Payer: Medicaid Other | Admitting: Family Medicine

## 2020-09-05 ENCOUNTER — Other Ambulatory Visit: Payer: Self-pay

## 2020-09-09 ENCOUNTER — Ambulatory Visit: Payer: Medicaid Other | Admitting: Family Medicine

## 2020-09-12 ENCOUNTER — Other Ambulatory Visit (HOSPITAL_COMMUNITY)
Admission: RE | Admit: 2020-09-12 | Discharge: 2020-09-12 | Disposition: A | Discharge status: Home / Self Care | Payer: Medicaid Other | Source: Ambulatory Visit | Attending: Gastroenterology | Admitting: Gastroenterology

## 2020-09-12 DIAGNOSIS — Z01812 Encounter for preprocedural laboratory examination: Secondary | ICD-10-CM | POA: Insufficient documentation

## 2020-09-12 DIAGNOSIS — Z20822 Contact with and (suspected) exposure to covid-19: Secondary | ICD-10-CM | POA: Insufficient documentation

## 2020-09-12 LAB — SARS CORONAVIRUS 2 (TAT 6-24 HRS): SARS Coronavirus 2: NEGATIVE

## 2020-09-16 ENCOUNTER — Other Ambulatory Visit: Payer: Self-pay

## 2020-09-16 ENCOUNTER — Ambulatory Visit (HOSPITAL_COMMUNITY): Payer: Medicaid Other | Admitting: Anesthesiology

## 2020-09-16 ENCOUNTER — Encounter (HOSPITAL_COMMUNITY)
Admission: RE | Disposition: A | Discharge status: Home / Self Care | Payer: Self-pay | Source: Home / Self Care | Attending: Gastroenterology

## 2020-09-16 ENCOUNTER — Ambulatory Visit (HOSPITAL_COMMUNITY)
Admission: RE | Admit: 2020-09-16 | Discharge: 2020-09-16 | Disposition: A | Discharge status: Home / Self Care | Payer: Medicaid Other | Attending: Gastroenterology | Admitting: Gastroenterology

## 2020-09-16 ENCOUNTER — Encounter (HOSPITAL_COMMUNITY): Payer: Self-pay | Admitting: Gastroenterology

## 2020-09-16 DIAGNOSIS — K5730 Diverticulosis of large intestine without perforation or abscess without bleeding: Secondary | ICD-10-CM | POA: Diagnosis not present

## 2020-09-16 DIAGNOSIS — K3189 Other diseases of stomach and duodenum: Secondary | ICD-10-CM

## 2020-09-16 DIAGNOSIS — K219 Gastro-esophageal reflux disease without esophagitis: Secondary | ICD-10-CM

## 2020-09-16 DIAGNOSIS — R197 Diarrhea, unspecified: Secondary | ICD-10-CM | POA: Insufficient documentation

## 2020-09-16 DIAGNOSIS — K2289 Other specified disease of esophagus: Secondary | ICD-10-CM | POA: Diagnosis not present

## 2020-09-16 DIAGNOSIS — K2950 Unspecified chronic gastritis without bleeding: Secondary | ICD-10-CM | POA: Insufficient documentation

## 2020-09-16 DIAGNOSIS — Z91048 Other nonmedicinal substance allergy status: Secondary | ICD-10-CM | POA: Diagnosis not present

## 2020-09-16 DIAGNOSIS — K648 Other hemorrhoids: Secondary | ICD-10-CM | POA: Insufficient documentation

## 2020-09-16 DIAGNOSIS — Z91013 Allergy to seafood: Secondary | ICD-10-CM | POA: Diagnosis not present

## 2020-09-16 DIAGNOSIS — Z8249 Family history of ischemic heart disease and other diseases of the circulatory system: Secondary | ICD-10-CM | POA: Diagnosis not present

## 2020-09-16 DIAGNOSIS — K621 Rectal polyp: Secondary | ICD-10-CM | POA: Insufficient documentation

## 2020-09-16 DIAGNOSIS — Z79899 Other long term (current) drug therapy: Secondary | ICD-10-CM | POA: Diagnosis not present

## 2020-09-16 DIAGNOSIS — Z791 Long term (current) use of non-steroidal anti-inflammatories (NSAID): Secondary | ICD-10-CM | POA: Insufficient documentation

## 2020-09-16 DIAGNOSIS — K625 Hemorrhage of anus and rectum: Secondary | ICD-10-CM

## 2020-09-16 DIAGNOSIS — F17210 Nicotine dependence, cigarettes, uncomplicated: Secondary | ICD-10-CM | POA: Insufficient documentation

## 2020-09-16 DIAGNOSIS — Z8349 Family history of other endocrine, nutritional and metabolic diseases: Secondary | ICD-10-CM | POA: Diagnosis not present

## 2020-09-16 DIAGNOSIS — Z91040 Latex allergy status: Secondary | ICD-10-CM | POA: Diagnosis not present

## 2020-09-16 DIAGNOSIS — Z800 Family history of malignant neoplasm of digestive organs: Secondary | ICD-10-CM | POA: Insufficient documentation

## 2020-09-16 DIAGNOSIS — K2970 Gastritis, unspecified, without bleeding: Secondary | ICD-10-CM | POA: Diagnosis not present

## 2020-09-16 DIAGNOSIS — Z833 Family history of diabetes mellitus: Secondary | ICD-10-CM | POA: Diagnosis not present

## 2020-09-16 DIAGNOSIS — E119 Type 2 diabetes mellitus without complications: Secondary | ICD-10-CM | POA: Diagnosis not present

## 2020-09-16 HISTORY — PX: ESOPHAGOGASTRODUODENOSCOPY (EGD) WITH PROPOFOL: SHX5813

## 2020-09-16 HISTORY — PX: BIOPSY: SHX5522

## 2020-09-16 HISTORY — PX: POLYPECTOMY: SHX5525

## 2020-09-16 HISTORY — PX: COLONOSCOPY WITH PROPOFOL: SHX5780

## 2020-09-16 LAB — GLUCOSE, CAPILLARY: Glucose-Capillary: 108 mg/dL — ABNORMAL HIGH (ref 70–99)

## 2020-09-16 SURGERY — COLONOSCOPY WITH PROPOFOL
Anesthesia: Monitor Anesthesia Care

## 2020-09-16 MED ORDER — LACTATED RINGERS IV SOLN: INTRAVENOUS | Status: DC | PRN

## 2020-09-16 MED ORDER — SODIUM CHLORIDE 0.9 % IV SOLN: INTRAVENOUS | Status: DC

## 2020-09-16 MED ORDER — PROPOFOL 500 MG/50ML IV EMUL
INTRAVENOUS | Status: DC | PRN
  Administered 2020-09-16: 100 ug/kg/min via INTRAVENOUS

## 2020-09-16 MED ORDER — PROPOFOL 10 MG/ML IV BOLUS
INTRAVENOUS | Status: DC | PRN
  Administered 2020-09-16 (×6): 10 mg via INTRAVENOUS

## 2020-09-16 MED ORDER — PROPOFOL 500 MG/50ML IV EMUL
INTRAVENOUS | Status: AC
  Filled 2020-09-16: qty 50

## 2020-09-16 MED ORDER — LIDOCAINE HCL 1 % IJ SOLN
INTRAMUSCULAR | Status: DC | PRN
  Administered 2020-09-16: 50 mg via INTRADERMAL

## 2020-09-16 SURGICAL SUPPLY — 24 items

## 2020-09-16 NOTE — Interval H&P Note (Signed)
History and Physical Interval Note:  09/16/2020 7:31 AM  [name] Signs  has presented today for surgery, with the diagnosis of Bright red blood per rectum, GERD.  The various methods of treatment have been discussed with the patient and family. After consideration of risks, benefits and other options for treatment, the patient has consented to  Procedure(s): COLONOSCOPY WITH PROPOFOL (N/A) ESOPHAGOGASTRODUODENOSCOPY (EGD) WITH PROPOFOL (N/A) as a surgical intervention.  The patient's history has been reviewed, patient examined, no change in status, stable for surgery.  I have reviewed the patient's chart and labs.  Questions were answered to the patient's satisfaction.     Lynann Bologna

## 2020-09-16 NOTE — Op Note (Signed)
[facility] Patient Name: Isabella Guerrero Procedure Date: 09/16/2020 MRN: [id number] Attending MD: Lynann Bologna , MD Date of Birth: 1990/11/25 CSN: [id number] Age: 30 Admit Type: Outpatient Procedure:                Colonoscopy Indications:              1. Rectal bleeding. 2. FH of colon cancer (dad at                            age 64 with stage IV colon cancer). 3. Intermittent                            diarrhea. Providers:                Lynann Bologna, MD, Shelda Jakes, RN, Leanne Lovely, Technician, Marlena Clipper, CRNA Referring MD:              Medicines:                Monitored Anesthesia Care Complications:            No immediate complications. Estimated Blood Loss:     Estimated blood loss: none. Procedure:                Pre-Anesthesia Assessment:                           - Prior to the procedure, a History and Physical                            was performed, and patient medications and                            allergies were reviewed. The patient's tolerance of                            previous anesthesia was also reviewed. The risks                            and benefits of the procedure and the sedation                            options and risks were discussed with the patient.                            All questions were answered, and informed consent                            was obtained. Prior Anticoagulants: The patient has                            taken no previous anticoagulant or antiplatelet  agents. ASA Grade Assessment: III - A patient with                            severe systemic disease. After reviewing the risks                            and benefits, the patient was deemed in                            satisfactory condition to undergo the procedure.                           After obtaining informed consent, the colonoscope                            was passed  under direct vision. Throughout the                            procedure, the patient's blood pressure, pulse, and                            oxygen saturations were monitored continuously. The                            CF-HQ190L ([id number]) Olympus colonoscope was                            introduced through the anus and advanced to the 2                            cm into the ileum. The colonoscopy was performed                            without difficulty. The patient tolerated the                            procedure well. The quality of the bowel                            preparation was good. The terminal ileum, ileocecal                            valve, appendiceal orifice, and rectum were                            photographed. Scope In: 8:52:04 AM Scope Out: 9:05:08 AM Scope Withdrawal Time: 0 hours 9 minutes 46 seconds  Total Procedure Duration: 0 hours 13 minutes 4 seconds  Findings:      A 6 mm polyp was found in the distal rectum, just above the dentate       line. The polyp was sessile. The polyp was removed with a cold snare.       Resection and retrieval were complete.      A few rare small-mouthed diverticula were found in  the sigmoid colon and       ascending colon.      The colon (entire examined portion) appeared normal with well preserved       vascular pattern. Biopsies were taken with a cold forceps for histology       from throughout the colon to rule out microscopic colitis.      Non-bleeding internal hemorrhoids were found during retroflexion. The       hemorrhoids were small.      The terminal ileum appeared normal. Impression:               - One 6 mm polyp in the distal rectum, removed with                            a cold snare. Resected and retrieved.                           - Minimal colonic diverticulosis.                           - Non-bleeding internal hemorrhoids.                           - Otherwise normal colonoscopy to TI. Moderate  Sedation:      Not Applicable - Patient had care per Anesthesia. Recommendation:           - Patient has a contact number available for                            emergencies. The signs and symptoms of potential                            delayed complications were discussed with the                            patient. Return to normal activities tomorrow.                            Written discharge instructions were provided to the                            patient.                           - Resume previous diet.                           - Continue present medications.                           - Preparation H 1 twice daily after the bowel                            movement for 7 to 10 days.                           - Await pathology results.                           -  Repeat colonoscopy in 5 years (likely d/t FH) for                            surveillance based on pathology results.                           - FU PRN Procedure Code(s):        --- Professional ---                           [phone], Colonoscopy, flexible; with removal of                            tumor(s), polyp(s), or other lesion(s) by snare                            technique                           45380, 59, Colonoscopy, flexible; with biopsy,                            single or multiple Diagnosis Code(s):        --- Professional ---                           Z80.0, Family history of malignant neoplasm of                            digestive organs                           K62.1, Rectal polyp                           K64.8, Other hemorrhoids                           K57.30, Diverticulosis of large intestine without                            perforation or abscess without bleeding CPT copyright 2019 American Medical Association. All rights reserved. The codes documented in this report are preliminary and upon coder review may  be revised to meet current compliance requirements. Lynann Bologna,  MD 09/16/2020 9:21:02 AM This report has been signed electronically. Number of Addenda: 0

## 2020-09-16 NOTE — Op Note (Signed)
[facility] Patient Name: Isabella Guerrero Procedure Date: 09/16/2020 MRN: [id number] Attending MD: Lynann Bologna , MD Date of Birth: 1991/02/21 CSN: [id number] Age: 30 Admit Type: Outpatient Procedure:                Upper GI endoscopy Indications:              Epigastric abdominal pain. Refractory GERD. Providers:                Lynann Bologna, MD, Shelda Jakes, RN, Leanne Lovely, Technician, Marlena Clipper, CRNA Referring MD:              Medicines:                Monitored Anesthesia Care Complications:            No immediate complications. Estimated Blood Loss:     Estimated blood loss: none. Procedure:                Pre-Anesthesia Assessment:                           - Prior to the procedure, a History and Physical                            was performed, and patient medications and                            allergies were reviewed. The patient's tolerance of                            previous anesthesia was also reviewed. The risks                            and benefits of the procedure and the sedation                            options and risks were discussed with the patient.                            All questions were answered, and informed consent                            was obtained. Prior Anticoagulants: The patient has                            taken no previous anticoagulant or antiplatelet                            agents. ASA Grade Assessment: II - A patient with                            mild systemic disease. After reviewing the risks  and benefits, the patient was deemed in                            satisfactory condition to undergo the procedure.                           After obtaining informed consent, the endoscope was                            passed under direct vision. Throughout the                            procedure, the patient's blood pressure, pulse, and                             oxygen saturations were monitored continuously. The                            [id number]-H190 ([id number]) was introduced through the                            mouth, and advanced to the second part of duodenum.                            The upper GI endoscopy was accomplished without                            difficulty. The patient tolerated the procedure                            well. Scope In: Scope Out: Findings:      The Z-line was irregular and was found 38 cm from the incisors with       healed distal esophageal erosions. Biopsies were taken with a cold       forceps for histology, directed by [facility].      Localized mildly erythematous mucosa without bleeding was found in the       gastric antrum. Biopsies were taken with a cold forceps for histology.      The examined duodenum was normal. Bx were not obtained since previous       biopsies were negative for celiac disease. Impression:               - Z-line irregular, 38 cm from the incisors.                            Biopsied.                           - Mild gastritis. Moderate Sedation:      Not Applicable - Patient had care per Anesthesia. Recommendation:           - Patient has a contact number available for                            emergencies. The signs and symptoms of potential  delayed complications were discussed with the                            patient. Return to normal activities tomorrow.                            Written discharge instructions were provided to the                            patient.                           - Resume previous diet.                           - Continue present medications.                           - Await pathology results.                           - Follow an antireflux regimen.                           - The findings and recommendations were discussed                            with the designated responsible adult. Procedure  Code(s):        --- Professional ---                           [phone], Esophagogastroduodenoscopy, flexible,                            transoral; with biopsy, single or multiple Diagnosis Code(s):        --- Professional ---                           K22.8, Other specified diseases of esophagus                           K31.89, Other diseases of stomach and duodenum                           R10.13, Epigastric pain CPT copyright 2019 American Medical Association. All rights reserved. The codes documented in this report are preliminary and upon coder review may  be revised to meet current compliance requirements. Lynann Bologna, MD 09/16/2020 9:14:15 AM This report has been signed electronically. Number of Addenda: 0

## 2020-09-16 NOTE — Discharge Instructions (Signed)
YOU HAD AN ENDOSCOPIC PROCEDURE TODAY: Refer to the procedure report and other information in the discharge instructions given to you for any specific questions about what was found during the examination. If this information does not answer your questions, please call [facility] office at [phone] to clarify.  ° °YOU SHOULD EXPECT: Some feelings of bloating in the abdomen. Passage of more gas than usual. Walking can help get rid of the air that was put into your GI tract during the procedure and reduce the bloating. If you had a lower endoscopy (such as a colonoscopy or flexible sigmoidoscopy) you may notice spotting of blood in your stool or on the toilet paper. Some abdominal soreness may be present for a day or two, also. ° °DIET: Your first meal following the procedure should be a light meal and then it is ok to progress to your normal diet. A half-sandwich or bowl of soup is an example of a good first meal. Heavy or fried foods are harder to digest and may make you feel nauseous or bloated. Drink plenty of fluids but you should avoid alcoholic beverages for 24 hours. If you had a esophageal dilation, please see attached instructions for diet.   ° °ACTIVITY: Your care partner should take you home directly after the procedure. You should plan to take it easy, moving slowly for the rest of the day. You can resume normal activity the day after the procedure however YOU SHOULD NOT DRIVE, use power tools, machinery or perform tasks that involve climbing or major physical exertion for 24 hours (because of the sedation medicines used during the test).  ° °SYMPTOMS TO REPORT IMMEDIATELY: °A gastroenterologist can be reached at any hour. Please call [phone]  for any of the following symptoms:  °Following lower endoscopy (colonoscopy, flexible sigmoidoscopy) °Excessive amounts of blood in the stool  °Significant tenderness, worsening of abdominal pains  °Swelling of the abdomen that is new, acute  °Fever of 100° or  higher  °Following upper endoscopy (EGD, EUS, ERCP, esophageal dilation) °Vomiting of blood or coffee ground material  °New, significant abdominal pain  °New, significant chest pain or pain under the shoulder blades  °Painful or persistently difficult swallowing  °New shortness of breath  °Black, tarry-looking or red, bloody stools ° °FOLLOW UP:  °If any biopsies were taken you will be contacted by phone or by letter within the next 1-3 weeks. Call [phone]  if you have not heard about the biopsies in 3 weeks.  °Please also call with any specific questions about appointments or follow up tests. ° °

## 2020-09-16 NOTE — Anesthesia Preprocedure Evaluation (Signed)
Anesthesia Evaluation   Patient awake    Reviewed: Allergy & Precautions, NPO status , Patient's Chart, lab work & pertinent test results  Airway Mallampati: III  TM Distance: >3 FB Neck ROM: Full    Dental  (+) Dental Advisory Given   Pulmonary shortness of breath, asthma , sleep apnea , Current Smoker,    breath sounds clear to auscultation + decreased breath sounds      Cardiovascular hypertension, negative cardio ROS   Rhythm:Regular Rate:Normal     Neuro/Psych  Neuromuscular disease    GI/Hepatic Neg liver ROS, GERD  ,  Endo/Other  diabetesMorbid obesity  Renal/GU negative Renal ROS     Musculoskeletal negative musculoskeletal ROS (+)   Abdominal (+) + obese,   Peds  Hematology negative hematology ROS (+)   Anesthesia Other Findings   Reproductive/Obstetrics                             Anesthesia Physical Anesthesia Plan  ASA: IV  Anesthesia Plan: MAC   Post-op Pain Management:    Induction: Intravenous  PONV Risk Score and Plan: 1 and Propofol infusion and Treatment may vary due to age or medical condition  Airway Management Planned: Natural Airway  Additional Equipment:   Intra-op Plan:   Post-operative Plan:   Informed Consent: I have reviewed the patients History and Physical, chart, labs and discussed the procedure including the risks, benefits and alternatives for the proposed anesthesia with the patient or authorized representative who has indicated his/her understanding and acceptance.     Dental advisory given  Plan Discussed with: CRNA  Anesthesia Plan Comments:        Anesthesia Quick Evaluation

## 2020-09-16 NOTE — Anesthesia Postprocedure Evaluation (Signed)
Anesthesia Post Note  Patient: Isabella Guerrero  Procedure(s) Performed: COLONOSCOPY WITH PROPOFOL (N/A ) ESOPHAGOGASTRODUODENOSCOPY (EGD) WITH PROPOFOL (N/A ) BIOPSY POLYPECTOMY     Patient location during evaluation: PACU Anesthesia Type: MAC Level of consciousness: awake and alert Pain management: pain level controlled Vital Signs Assessment: post-procedure vital signs reviewed and stable Respiratory status: spontaneous breathing Cardiovascular status: stable Anesthetic complications: no   No complications documented.  Last Vitals:  Vitals:   09/16/20 0932 09/16/20 0940  BP: 128/72   Pulse: 84 78  Resp: 16 20  Temp:    SpO2: 98% 99%    Last Pain:  Vitals:   09/16/20 0914  TempSrc: Oral  PainSc: 0-No pain                 Nolon Nations

## 2020-09-16 NOTE — Transfer of Care (Signed)
Immediate Anesthesia Transfer of Care Note  Patient: Isabella Guerrero  Procedure(s) Performed: COLONOSCOPY WITH PROPOFOL (N/A ) ESOPHAGOGASTRODUODENOSCOPY (EGD) WITH PROPOFOL (N/A ) BIOPSY POLYPECTOMY  Patient Location: PACU and Endoscopy Unit  Anesthesia Type:MAC  Level of Consciousness: awake, alert , oriented and patient cooperative  Airway & Oxygen Therapy: Patient Spontanous Breathing and Patient connected to face mask oxygen  Post-op Assessment: Report given to RN and Post -op Vital signs reviewed and stable  Post vital signs: Reviewed and stable  Last Vitals:  Vitals Value Taken Time  BP    Temp    Pulse 86 09/16/20 0915  Resp 17 09/16/20 0915  SpO2 100 % 09/16/20 0915  Vitals shown include unvalidated device data.  Last Pain:  Vitals:   09/16/20 0735  TempSrc: Oral  PainSc: 0-No pain         Complications: No complications documented.

## 2020-09-17 ENCOUNTER — Encounter (HOSPITAL_COMMUNITY): Payer: Self-pay | Admitting: Gastroenterology

## 2020-09-17 ENCOUNTER — Encounter: Payer: Self-pay | Admitting: Gastroenterology

## 2020-09-17 LAB — SURGICAL PATHOLOGY

## 2020-09-23 ENCOUNTER — Other Ambulatory Visit: Payer: Self-pay

## 2020-09-23 ENCOUNTER — Encounter (INDEPENDENT_AMBULATORY_CARE_PROVIDER_SITE_OTHER): Payer: Self-pay | Admitting: Family Medicine

## 2020-09-23 ENCOUNTER — Other Ambulatory Visit (INDEPENDENT_AMBULATORY_CARE_PROVIDER_SITE_OTHER): Payer: Self-pay | Admitting: Family Medicine

## 2020-09-23 ENCOUNTER — Ambulatory Visit (INDEPENDENT_AMBULATORY_CARE_PROVIDER_SITE_OTHER): Payer: Medicaid Other | Admitting: Family Medicine

## 2020-09-23 VITALS — BP 127/83 | HR 95 | Temp 98.7°F | Ht 66.0 in | Wt 325.0 lb

## 2020-09-23 DIAGNOSIS — Z6841 Body Mass Index (BMI) 40.0 and over, adult: Secondary | ICD-10-CM | POA: Diagnosis not present

## 2020-09-23 DIAGNOSIS — K219 Gastro-esophageal reflux disease without esophagitis: Secondary | ICD-10-CM | POA: Diagnosis not present

## 2020-09-23 DIAGNOSIS — E559 Vitamin D deficiency, unspecified: Secondary | ICD-10-CM

## 2020-09-23 DIAGNOSIS — F32A Depression, unspecified: Secondary | ICD-10-CM | POA: Diagnosis not present

## 2020-09-23 DIAGNOSIS — F419 Anxiety disorder, unspecified: Secondary | ICD-10-CM | POA: Diagnosis not present

## 2020-09-23 DIAGNOSIS — E1165 Type 2 diabetes mellitus with hyperglycemia: Secondary | ICD-10-CM | POA: Diagnosis not present

## 2020-09-23 MED ORDER — VITAMIN D (ERGOCALCIFEROL) 1.25 MG (50000 UNIT) PO CAPS: 50000.0000 [IU] | ORAL_CAPSULE | ORAL | 0 refills | Status: DC

## 2020-09-23 MED ORDER — TRULICITY 0.75 MG/0.5ML ~~LOC~~ SOAJ: 0.7500 mg | SUBCUTANEOUS | 0 refills | Status: DC

## 2020-09-23 MED ORDER — PANTOPRAZOLE SODIUM 20 MG PO TBEC: 20.0000 mg | DELAYED_RELEASE_TABLET | Freq: Every day | ORAL | 11 refills | Status: DC

## 2020-09-23 MED FILL — VIT D2 1.25 MG (50,000 UNIT: 1.25 MG | 28 days supply | Qty: 4 | Fill #0

## 2020-09-23 MED FILL — PANTOPRAZOLE SOD DR 20 MG T: 20 | 30 days supply | Qty: 30 | Fill #0
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
## 2020-09-24 ENCOUNTER — Encounter: Payer: Self-pay | Admitting: Family Medicine

## 2020-09-24 ENCOUNTER — Other Ambulatory Visit: Payer: Self-pay

## 2020-09-24 ENCOUNTER — Ambulatory Visit (INDEPENDENT_AMBULATORY_CARE_PROVIDER_SITE_OTHER): Payer: Medicaid Other

## 2020-09-24 ENCOUNTER — Ambulatory Visit (INDEPENDENT_AMBULATORY_CARE_PROVIDER_SITE_OTHER): Payer: Medicaid Other | Admitting: Family Medicine

## 2020-09-24 VITALS — BP 132/84 | HR 101 | Ht 66.0 in | Wt 332.0 lb

## 2020-09-24 DIAGNOSIS — M546 Pain in thoracic spine: Secondary | ICD-10-CM

## 2020-09-24 DIAGNOSIS — M654 Radial styloid tenosynovitis [de Quervain]: Secondary | ICD-10-CM

## 2020-09-24 DIAGNOSIS — M50123 Cervical disc disorder at C6-C7 level with radiculopathy: Secondary | ICD-10-CM | POA: Diagnosis not present

## 2020-09-24 DIAGNOSIS — M542 Cervicalgia: Secondary | ICD-10-CM

## 2020-09-24 DIAGNOSIS — M545 Low back pain, unspecified: Secondary | ICD-10-CM

## 2020-09-24 DIAGNOSIS — G2589 Other specified extrapyramidal and movement disorders: Secondary | ICD-10-CM

## 2020-09-24 HISTORY — DX: Other specified extrapyramidal and movement disorders: G25.89

## 2020-09-24 NOTE — Assessment & Plan Note (Signed)
Patient does have what appears to be more of a scapular dyskinesis.  Given more exercises.  Can continue to use the massage done, topical anti-inflammatories discussed other over-the-counter medications therapy will be beneficial.  Encouraged weight loss.  Continue the once weekly vitamin D.  Patient will follow up with me again in 6 to 8 weeks.  At that point if continuing to have pain we will consider the possibility of osteopathic manipulation as well as formal physical therapy.

## 2020-09-24 NOTE — Progress Notes (Signed)
Isabella Guerrero - 30 y.o. female MRN [id number]  Date of birth: Feb 16, 1991  SUBJECTIVE:  Including CC & ROS.  No chief complaint on file.   Isabella Guerrero is a 30 y.o. female that is following up for her right wrist pain.  She has done well with the thumb spica.  No pain today.  She does have some lack of extension of the thumb.   Review of Systems See HPI   HISTORY: Past Medical, Surgical, Social, and Family History Reviewed & Updated per EMR.   Pertinent Historical Findings include:  Past Medical History:  Diagnosis Date  . Asthma   . Back pain   . Constipation   . Fatigue   . Foot pain   . GDM (gestational diabetes mellitus) 12/2018  . GERD (gastroesophageal reflux disease)   . Gestational diabetes   . Hypertension   . Joint pain   . Knee pain   . Lower back pain   . Obesity   . Palpitations   . Prediabetes   . Shortness of breath   . Shortness of breath on exertion   . Sleep apnea     Past Surgical History:  Procedure Laterality Date  . BIOPSY  09/16/2020   Procedure: BIOPSY;  Surgeon: Lynann Bologna, MD;  Location: [facility];  Service: Endoscopy;;  EGD and COLON  . CESAREAN SECTION N/A 03/01/2019   Procedure: CESAREAN SECTION;  Surgeon: Huel Cote, MD;  Location: [facility];  Service: Obstetrics;  Laterality: N/A;  need extra in OR and request RNFA  . COLONOSCOPY WITH PROPOFOL N/A 09/16/2020   Procedure: COLONOSCOPY WITH PROPOFOL;  Surgeon: Lynann Bologna, MD;  Location: [facility];  Service: Endoscopy;  Laterality: N/A;  . DRUG INDUCED ENDOSCOPY    . ESOPHAGOGASTRODUODENOSCOPY  12/28/2011   Mild gastritis. Otherwise normal EGD  . ESOPHAGOGASTRODUODENOSCOPY (EGD) WITH PROPOFOL N/A 09/16/2020   Procedure: ESOPHAGOGASTRODUODENOSCOPY (EGD) WITH PROPOFOL;  Surgeon: Lynann Bologna, MD;  Location: [facility];  Service: Endoscopy;  Laterality: N/A;  . FINGER SURGERY    . POLYPECTOMY  09/16/2020   Procedure: POLYPECTOMY;  Surgeon: Lynann Bologna, MD;  Location: [facility];  Service: Endoscopy;;  . TONSILLECTOMY    . WISDOM TOOTH EXTRACTION      Family History  Problem Relation Age of Onset  . Diabetes Mother   . Hypertension Mother   . Heart disease Mother   . Hyperlipidemia Mother   . Thyroid disease Mother   . Obesity Mother   . Hypertension Father   . Diabetes Father   . Hyperlipidemia Father   . Heart disease Father   . Cancer Father   . Sleep apnea Father   . Obesity Father   . Colon cancer Father        stage 4   . Esophageal cancer Maternal Uncle   . Colon cancer Maternal Grandmother     Social History   Socioeconomic History  . Marital status: Single    Spouse name: Not on file  . Number of children: Not on file  . Years of education: Not on file  . Highest education level: Not on file  Occupational History  . Occupation: homemaker  Tobacco Use  . Smoking status: Current Every Day Smoker    Packs/day: 0.25    Types: Cigarettes    Last attempt to quit: 07/08/2018    Years since quitting: 2.2  . Smokeless tobacco: Never Used  . Tobacco comment: Currently taking medication to assist in cessation  Vaping  Use  . Vaping Use: Never used  Substance and Sexual Activity  . Alcohol use: Not Currently    Alcohol/week: 1.0 standard drink    Types: 1 Cans of beer per week    Comment: once in a while  . Drug use: Never  . Sexual activity: Yes    Partners: Male    Birth control/protection: Implant  Other Topics Concern  . Not on file  Social History Narrative  . Not on file   Social Determinants of Health   Financial Resource Strain: Not on file  Food Insecurity: Not on file  Transportation Needs: Not on file  Physical Activity: Not on file  Stress: Not on file  Social Connections: Not on file  Intimate Partner Violence: Not on file     PHYSICAL EXAM:  VS: BP 118/72 (BP Location: Right Arm, Patient Position: Sitting, Cuff Size: Large)   Ht 5\' 6"  (1.676 m)   Wt (!) 332 lb (150.6 kg)   LMP 09/06/2020   BMI  53.59 kg/m  Physical Exam Gen: NAD, alert, cooperative with exam, well-appearing MSK:  Right wrist and hand: No swelling or ecchymosis. Normal wrist range of motion. Some weakness with thumb extension to resistance. Neurovascular intact     ASSESSMENT & PLAN:   De Quervain's tenosynovitis, right Has had improvement of her pain and function.  Has mild weakness with thumb extension. -Counseled on home exercise therapy and supportive,. -Can consider injection or physical therapy following

## 2020-09-24 NOTE — Patient Instructions (Addendum)
Exercises Xray today Tart cherry extract 1200mg  at night See me in 6-7 weeks

## 2020-09-24 NOTE — Assessment & Plan Note (Signed)
Has had improvement of her pain and function.  Has mild weakness with thumb extension. -Counseled on home exercise therapy and supportive,. -Can consider injection or physical therapy following

## 2020-09-24 NOTE — Patient Instructions (Signed)
Good to see you Please try the exercises  Please use ice if needed  Please send me a message in MyChart with any questions or updates.  Please see [place] back as needed.   --Dr. Jordan Likes

## 2020-09-24 NOTE — Progress Notes (Signed)
[facility] [address] [place] [address] Phone: [phone] Subjective:   Isabella Guerrero, am serving as a scribe for Dr. Antoine Primas. This visit occurred during the SARS-CoV-2 public health emergency.  Safety protocols were in place, including screening questions prior to the visit, additional usage of staff PPE, and extensive cleaning of exam room while observing appropriate contact time as indicated for disinfecting solutions.   I'm seeing this patient by the request  of:  Cirigliano, Jearld Lesch, DO  CC: Back pain  MEB:[id number]  Isabella Guerrero is a 30 y.o. female coming in with complaint of thoracic spine pain for 2 months. Taking a deep breath hurts. States that she is a new mom. Pain in right scapula that radiates to ribs on that side. Pain with sitting or lying in one spot. Patient notes that her hip and lower back pain is intermittent. Patient is unable to take NSAIDs due to stomach issues. Occasionally used Tylenol for pain and heat. Has been stretching for pain relief as well. Denies any radiating symptoms into the shoulder.  Patient states that it is just more of a discomfort.  Patient denies any significant shortness of breath though.  Patient states that it is more just uncomfortable.  Patient states that she can do regular daily activities just fine.  Sometimes though can give her discomfort and makes her very aware of it.      Past Medical History:  Diagnosis Date  . Asthma   . Back pain   . Constipation   . Fatigue   . Foot pain   . GDM (gestational diabetes mellitus) 12/2018  . GERD (gastroesophageal reflux disease)   . Gestational diabetes   . Hypertension   . Joint pain   . Knee pain   . Lower back pain   . Obesity   . Palpitations   . Prediabetes   . Shortness of breath   . Shortness of breath on exertion   . Sleep apnea    Past Surgical History:  Procedure Laterality Date  . BIOPSY  09/16/2020   Procedure: BIOPSY;   Surgeon: Lynann Bologna, MD;  Location: [facility];  Service: Endoscopy;;  EGD and COLON  . CESAREAN SECTION N/A 03/01/2019   Procedure: CESAREAN SECTION;  Surgeon: Huel Cote, MD;  Location: [facility];  Service: Obstetrics;  Laterality: N/A;  need extra in OR and request RNFA  . COLONOSCOPY WITH PROPOFOL N/A 09/16/2020   Procedure: COLONOSCOPY WITH PROPOFOL;  Surgeon: Lynann Bologna, MD;  Location: [facility];  Service: Endoscopy;  Laterality: N/A;  . DRUG INDUCED ENDOSCOPY    . ESOPHAGOGASTRODUODENOSCOPY  12/28/2011   Mild gastritis. Otherwise normal EGD  . ESOPHAGOGASTRODUODENOSCOPY (EGD) WITH PROPOFOL N/A 09/16/2020   Procedure: ESOPHAGOGASTRODUODENOSCOPY (EGD) WITH PROPOFOL;  Surgeon: Lynann Bologna, MD;  Location: [facility];  Service: Endoscopy;  Laterality: N/A;  . FINGER SURGERY    . POLYPECTOMY  09/16/2020   Procedure: POLYPECTOMY;  Surgeon: Lynann Bologna, MD;  Location: [facility];  Service: Endoscopy;;  . TONSILLECTOMY    . WISDOM TOOTH EXTRACTION     Social History   Socioeconomic History  . Marital status: Single    Spouse name: Not on file  . Number of children: Not on file  . Years of education: Not on file  . Highest education level: Not on file  Occupational History  . Occupation: homemaker  Tobacco Use  . Smoking status: Current Every Day Smoker    Packs/day:  0.25    Types: Cigarettes    Last attempt to quit: 07/08/2018    Years since quitting: 2.2  . Smokeless tobacco: Never Used  . Tobacco comment: Currently taking medication to assist in cessation  Vaping Use  . Vaping Use: Never used  Substance and Sexual Activity  . Alcohol use: Not Currently    Alcohol/week: 1.0 standard drink    Types: 1 Cans of beer per week    Comment: once in a while  . Drug use: Never  . Sexual activity: Yes    Partners: Male    Birth control/protection: Implant  Other Topics Concern  . Not on file  Social History Narrative  . Not on file   Social  Determinants of Health   Financial Resource Strain: Not on file  Food Insecurity: Not on file  Transportation Needs: Not on file  Physical Activity: Not on file  Stress: Not on file  Social Connections: Not on file   Allergies  Allergen Reactions  . Scallops [Shellfish Allergy] Anaphylaxis and Hives  . Adhesive [Tape] Rash  . Latex Rash  . Lidocaine Other (See Comments)    Pt reports feeling abnormal, feeling faint   Family History  Problem Relation Age of Onset  . Diabetes Mother   . Hypertension Mother   . Heart disease Mother   . Hyperlipidemia Mother   . Thyroid disease Mother   . Obesity Mother   . Hypertension Father   . Diabetes Father   . Hyperlipidemia Father   . Heart disease Father   . Cancer Father   . Sleep apnea Father   . Obesity Father   . Colon cancer Father        stage 4   . Esophageal cancer Maternal Uncle   . Colon cancer Maternal Grandmother     Current Outpatient Medications (Endocrine & Metabolic):  Marland Kitchen  Dulaglutide (TRULICITY) 0.75 MG/0.5ML SOPN, Inject 0.75 mg into the skin once a week. .  etonogestrel (NEXPLANON) 68 MG IMPL implant, 1 each by Subdermal route once. In Left Arm  Current Outpatient Medications (Cardiovascular):  .  hydrochlorothiazide (MICROZIDE) 12.5 MG capsule, Take 1 capsule (12.5 mg total) by mouth daily. .  metoprolol succinate (TOPROL-XL) 25 MG 24 hr tablet, Take 1 tablet (25 mg total) by mouth daily.  Current Outpatient Medications (Respiratory):  .  cetirizine (ZYRTEC) 10 MG tablet, Take 10 mg by mouth daily. .  diphenhydrAMINE (BENADRYL) 25 MG tablet, Take 50 mg by mouth at bedtime. .  fluticasone (FLONASE) 50 MCG/ACT nasal spray, Place 1 spray into both nostrils daily.    Current Outpatient Medications (Other):  .  pantoprazole (PROTONIX) 20 MG tablet, Take 1 tablet (20 mg total) by mouth daily. .  Vitamin D, Ergocalciferol, (DRISDOL) 1.25 MG (50000 UNIT) CAPS capsule, Take 1 capsule (50,000 Units total) by mouth  every 7 (seven) days.   Reviewed prior external information including notes and imaging from  primary care provider As well as notes that were available from care everywhere and other healthcare systems.  Past medical history, social, surgical and family history all reviewed in electronic medical record.  No pertanent information unless stated regarding to the chief complaint.   Review of Systems:  No headache, visual changes, nausea, vomiting, diarrhea, constipation, dizziness, abdominal pain, skin rash, fevers, chills, night sweats, weight loss, swollen lymph nodes, body aches, joint swelling, chest pain, shortness of breath, mood changes. POSITIVE muscle aches  Objective  Blood pressure 132/84, pulse (!) 101, height  5\' 6"  (1.676 m), weight (!) 332 lb (150.6 kg), last menstrual period 09/06/2020, SpO2 96 %, unknown if currently breastfeeding.   General: No apparent distress alert and oriented x3 mood and affect normal, dressed appropriately.  Morbidly obese HEENT: Pupils equal, extraocular movements intact  Respiratory: Patient's speak in full sentences and does not appear short of breath  Cardiovascular: No lower extremity edema, non tender, no erythema  Gait normal with good balance and coordination.  MSK:  Non tender with full range of motion and good stability and symmetric strength and tone of elbows, wrist, hip, knee and ankles bilaterally.  Patient's neck exam has very mild loss of 5 degrees of extension.  Negative Spurling's though noted today.  Mild loss of sidebending to the right. Patient's right shoulder does have full range of motion.  No signs of impingement.  Does have some scapular dyskinesis right greater than left.  Tender to palpation in the parascapular region.  No masses appreciated.  Some tightness noted in the thoracolumbar juncture also noted.  Lower body neurovascularly intact distally.  5 out of 5 strength of the upper and lower extremities noted  [id number]; 15  additional minutes spent for Therapeutic exercises as stated in above notes.  This included exercises focusing on stretching, strengthening, with significant focus on eccentric aspects.   Long term goals include an improvement in range of motion, strength, endurance as well as avoiding reinjury. Patient's frequency would include in 1-2 times a day, 3-5 times a week for a duration of 6-12 weeks. Exercises that included:  Basic scapular stabilization to include adduction and depression of scapula Scaption, focusing on proper movement and good control Internal and External rotation utilizing a theraband, with elbow tucked at side entire time Rows with theraband    Proper technique shown and discussed handout in great detail with ATC.  All questions were discussed and answered.     Impression and Recommendations:     The above documentation has been reviewed and is accurate and complete 09/08/2020, DO

## 2020-09-24 NOTE — Progress Notes (Signed)
Chief Complaint:   OBESITY Isabella Guerrero is here to discuss her progress with her obesity treatment plan along with follow-up of her obesity related diagnoses. Isabella Guerrero is on the Category 4 Plan and states she is following her eating plan approximately 60% of the time. Isabella Guerrero states she is walking 30 minutes 3 times per week.  Today's visit was #: 8 Starting weight: 344 lbs Starting date: 06/03/2020 Today's weight: 325 lbs Today's date: 09/23/2020 Total lbs lost to date: 19 lbs Total lbs lost since last in-office visit: 6 lbs  Interim History: Pt had a colonoscopy and endoscopy over the last few weeks, which detected small diverticula in upper intestine. She went out walking a few times over the last few weeks. Pt is starting to realize her significant other may be sabotaging her indirectly with his nightly sweet snack ritual.  Subjective:   1. Vitamin D deficiency Pt denies nausea, vomiting, and muscle weakness but notes fatigue. Pt is on prescription Vit D. Her last Vit D level was 20.8.  2. Type 2 diabetes mellitus with hyperglycemia, without long-term current use of insulin (HCC) Pt is on Trulicity 0.75 mg SubQ weekly and denies GI upset side effects.  Lab Results  Component Value Date   HGBA1C 6.5 (H) 06/03/2020   Lab Results  Component Value Date   LDLCALC 149 (H) 06/03/2020   CREATININE 0.64 06/03/2020   Lab Results  Component Value Date   INSULIN 18.4 06/03/2020    3. Gastroesophageal reflux disease, unspecified whether esophagitis present Pt is on Protonix and reports improvements in symptoms.  4. Anxiety and depression Pt is noting some emotional eating tendencies and questions sabotage from her significant other. She is not on meds.  Assessment/Plan:   1. Vitamin D deficiency Low Vitamin D level contributes to fatigue and are associated with obesity, breast, and colon cancer. She agrees to continue to take prescription Vitamin D @50 ,000 IU every week and will  follow-up for routine testing of Vitamin D, at least 2-3 times per year to avoid over-replacement. - Vitamin D, Ergocalciferol, (DRISDOL) 1.25 MG (50000 UNIT) CAPS capsule; Take 1 capsule (50,000 Units total) by mouth every 7 (seven) days.  Dispense: 4 capsule; Refill: 0  2. Type 2 diabetes mellitus with hyperglycemia, without long-term current use of insulin (HCC) Good blood sugar control is important to decrease the likelihood of diabetic complications such as nephropathy, neuropathy, limb loss, blindness, coronary artery disease, and death. Intensive lifestyle modification including diet, exercise and weight loss are the first line of treatment for diabetes.  - Dulaglutide (TRULICITY) 0.75 MG/0.5ML SOPN; Inject 0.75 mg into the skin once a week.  Dispense: 2 mL; Refill: 0  3. Gastroesophageal reflux disease, unspecified whether esophagitis present Intensive lifestyle modifications are the first line treatment for this issue. We discussed several lifestyle modifications today and she will continue to work on diet, exercise and weight loss efforts. Orders and follow up as documented in patient record.   Counseling . If a person has gastroesophageal reflux disease (GERD), food and stomach acid move back up into the esophagus and cause symptoms or problems such as damage to the esophagus. . Anti-reflux measures include: raising the head of the bed, avoiding tight clothing or belts, avoiding eating late at night, not lying down shortly after mealtime, and achieving weight loss. . Avoid ASA, NSAID's, caffeine, alcohol, and tobacco.  . OTC Pepcid and/or Tums are often very helpful for as needed use.  However, for persisting chronic or  daily symptoms, stronger medications like Omeprazole may be needed. . You may need to avoid foods and drinks such as: ? Coffee and tea (with or without caffeine). ? Drinks that contain alcohol. ? Energy drinks and sports drinks. ? Bubbly (carbonated) drinks or  sodas. ? Chocolate and cocoa. ? Peppermint and mint flavorings. ? Garlic and onions. ? Horseradish. ? Spicy and acidic foods. These include peppers, chili powder, curry powder, vinegar, hot sauces, and BBQ sauce. ? Citrus fruit juices and citrus fruits, such as oranges, lemons, and limes. ? Tomato-based foods. These include red sauce, chili, salsa, and pizza with red sauce. ? Fried and fatty foods. These include donuts, french fries, potato chips, and high-fat dressings. ? High-fat meats. These include hot dogs, rib eye steak, sausage, ham, and bacon.  - pantoprazole (PROTONIX) 20 MG tablet; Take 1 tablet (20 mg total) by mouth daily.  Dispense: 30 tablet; Refill: 11  4. Anxiety and depression Behavior modification techniques were discussed today to help Isabella Guerrero deal with her emotional/non-hunger eating behaviors.  Orders and follow up as documented in patient record. Refer to Dr. Dewaine Conger.  5. Class 3 severe obesity with serious comorbidity and body mass index (BMI) of 50.0 to 59.9 in adult, unspecified obesity type (HCC) Isabella Guerrero is currently in the action stage of change. As such, her goal is to continue with weight loss efforts. She has agreed to the Category 4 Plan.   Exercise goals: No exercise has been prescribed at this time.  Behavioral modification strategies: increasing lean protein intake, meal planning and cooking strategies, keeping healthy foods in the home and planning for success.  Miaa has agreed to follow-up with our clinic in 2-3 weeks. She was informed of the importance of frequent follow-up visits to maximize her success with intensive lifestyle modifications for her multiple health conditions.   Objective:   Blood pressure 127/83, pulse 95, temperature 98.7 F (37.1 C), temperature source Oral, height 5\' 6"  (1.676 m), weight (!) 325 lb (147.4 kg), last menstrual period 09/06/2020, SpO2 96 %, unknown if currently breastfeeding. Body mass index is 52.46  kg/m.  General: Cooperative, alert, well developed, in no acute distress. HEENT: Conjunctivae and lids unremarkable. Cardiovascular: Regular rhythm.  Lungs: Normal work of breathing. Neurologic: No focal deficits.   Lab Results  Component Value Date   CREATININE 0.64 06/03/2020   BUN 11 06/03/2020   NA 139 06/03/2020   K 4.8 06/03/2020   CL 98 06/03/2020   CO2 28 06/03/2020   Lab Results  Component Value Date   ALT 16 06/03/2020   AST 18 06/03/2020   ALKPHOS 78 06/03/2020   BILITOT 0.2 06/03/2020   Lab Results  Component Value Date   HGBA1C 6.5 (H) 06/03/2020   Lab Results  Component Value Date   INSULIN 18.4 06/03/2020   Lab Results  Component Value Date   TSH 3.000 06/03/2020   Lab Results  Component Value Date   CHOL 224 (H) 06/03/2020   HDL 33 (L) 06/03/2020   LDLCALC 149 (H) 06/03/2020   TRIG 228 (H) 06/03/2020   Lab Results  Component Value Date   WBC 11.7 (H) 06/03/2020   HGB 13.5 06/03/2020   HCT 42.7 06/03/2020   MCV 85 06/03/2020   PLT 334 06/03/2020    Attestation Statements:   Reviewed by clinician on day of visit: allergies, medications, problem list, medical history, surgical history, family history, social history, and previous encounter notes.  Time spent on visit including pre-visit chart review and  post-visit care and charting was 25 minutes.   Edmund Hilda, am acting as transcriptionist for Reuben Likes, MD.  I have reviewed the above documentation for accuracy and completeness, and I agree with the above. - [name] Mires, MD

## 2020-09-26 MED FILL — TRULICITY 0.75 MG/0.5 ML PE: 0.75 | 28 days supply | Qty: 2 | Fill #0

## 2020-10-07 ENCOUNTER — Telehealth (INDEPENDENT_AMBULATORY_CARE_PROVIDER_SITE_OTHER): Payer: Medicaid Other | Admitting: Psychology

## 2020-10-07 ENCOUNTER — Ambulatory Visit (INDEPENDENT_AMBULATORY_CARE_PROVIDER_SITE_OTHER): Payer: Medicaid Other | Admitting: Family Medicine

## 2020-10-07 ENCOUNTER — Encounter (INDEPENDENT_AMBULATORY_CARE_PROVIDER_SITE_OTHER): Payer: Self-pay | Admitting: Family Medicine

## 2020-10-07 ENCOUNTER — Other Ambulatory Visit (INDEPENDENT_AMBULATORY_CARE_PROVIDER_SITE_OTHER): Payer: Self-pay | Admitting: Family Medicine

## 2020-10-07 ENCOUNTER — Other Ambulatory Visit: Payer: Self-pay

## 2020-10-07 VITALS — BP 142/81 | HR 89 | Temp 98.3°F | Ht 66.0 in | Wt 320.0 lb

## 2020-10-07 DIAGNOSIS — Z6841 Body Mass Index (BMI) 40.0 and over, adult: Secondary | ICD-10-CM

## 2020-10-07 DIAGNOSIS — E1165 Type 2 diabetes mellitus with hyperglycemia: Secondary | ICD-10-CM

## 2020-10-07 DIAGNOSIS — F5089 Other specified eating disorder: Secondary | ICD-10-CM

## 2020-10-07 DIAGNOSIS — E559 Vitamin D deficiency, unspecified: Secondary | ICD-10-CM | POA: Diagnosis not present

## 2020-10-07 DIAGNOSIS — F419 Anxiety disorder, unspecified: Secondary | ICD-10-CM

## 2020-10-07 DIAGNOSIS — E785 Hyperlipidemia, unspecified: Secondary | ICD-10-CM

## 2020-10-07 DIAGNOSIS — F32A Depression, unspecified: Secondary | ICD-10-CM | POA: Diagnosis not present

## 2020-10-07 DIAGNOSIS — E1169 Type 2 diabetes mellitus with other specified complication: Secondary | ICD-10-CM

## 2020-10-07 MED ORDER — VITAMIN D (ERGOCALCIFEROL) 1.25 MG (50000 UNIT) PO CAPS: 50000.0000 [IU] | ORAL_CAPSULE | ORAL | 0 refills | Status: DC

## 2020-10-07 MED ORDER — BUSPIRONE HCL 5 MG PO TABS: 5.0000 mg | ORAL_TABLET | Freq: Three times a day (TID) | ORAL | 0 refills | Status: DC | PRN

## 2020-10-07 MED ORDER — TRULICITY 0.75 MG/0.5ML ~~LOC~~ SOAJ: 0.7500 mg | SUBCUTANEOUS | 0 refills | Status: DC

## 2020-10-07 MED FILL — busPIRone HCL 5 MG TABS: 5 | 30 days supply | Qty: 90 | Fill #0

## 2020-10-07 NOTE — Telephone Encounter (Signed)
Please advise 

## 2020-10-08 NOTE — Progress Notes (Signed)
Chief Complaint:   OBESITY Isabella Guerrero is here to discuss her progress with her obesity treatment plan along with follow-up of her obesity related diagnoses. Isabella Guerrero is on the Category 4 Plan and states she is following her eating plan approximately 70-75% of the time. Isabella Guerrero states she is doing 0 minutes 0 times per week.  Today's visit was #: 9 Starting weight: 344 lbs Starting date: 06/03/2020 Today's weight: 320 lbs Today's date: 10/07/2020 Total lbs lost to date: 24 lbs Total lbs lost since last in-office visit: 5 lbs  Interim History: The last few weeks have been good and pt was able to adhere to plan 70-75%. She did indulge in pumpkin pie last night and has tried to be mindful of food choices when eating out. She had her first appointment with Dr. Dewaine Conger earlier. Pt is to decide what her next goal is in terms of either exercise or smoking cessation.  Subjective:   1. Vitamin D deficiency Isabella Guerrero denies nausea, vomiting, and muscle weakness but notes fatigue. She is on prescription Vit D. Her last Vit D level was 20.8.  2. Type 2 diabetes mellitus with hyperglycemia, without long-term current use of insulin (HCC) Doing well on Trulicity 0.75 mg. She denies GI side effects. Her last A1c 6.5 with an insulin level of 18.4.  3. Anxiety and depression Isabella Guerrero was previously on Wellbutrin but is currently not on medication.  4. Hyperlipidemia associated with type 2 diabetes mellitus (HCC) Isabella Guerrero's last LDL 149, HDL 33, and triglycerides 401. She is not on statin therapy.  Assessment/Plan:   1. Vitamin D deficiency Low Vitamin D level contributes to fatigue and are associated with obesity, breast, and colon cancer. She agrees to continue to take prescription Vitamin D @50 ,000 IU every week and will follow-up for routine testing of Vitamin D, at least 2-3 times per year to avoid over-replacement.  - Vitamin D, Ergocalciferol, (DRISDOL) 1.25 MG (50000 UNIT) CAPS capsule; Take 1 capsule  (50,000 Units total) by mouth every 7 (seven) days.  Dispense: 4 capsule; Refill: 0  2. Type 2 diabetes mellitus with hyperglycemia, without long-term current use of insulin (HCC) Good blood sugar control is important to decrease the likelihood of diabetic complications such as nephropathy, neuropathy, limb loss, blindness, coronary artery disease, and death. Intensive lifestyle modification including diet, exercise and weight loss are the first line of treatment for diabetes.   - Dulaglutide (TRULICITY) 0.75 MG/0.5ML SOPN; Inject 0.75 mg into the skin once a week.  Dispense: 2 mL; Refill: 0  3. Anxiety and depression Behavior modification techniques were discussed today to help Isabella Guerrero deal with her anxiety.  Orders and follow up as documented in patient record. Start Buspar 5 mg, as per below.  - busPIRone (BUSPAR) 5 MG tablet; Take 1 tablet (5 mg total) by mouth 3 (three) times daily as needed (anxiety).  Dispense: 90 tablet; Refill: 0  4. Hyperlipidemia associated with type 2 diabetes mellitus (HCC) Cardiovascular risk and specific lipid/LDL goals reviewed.  We discussed several lifestyle modifications today and Isabella Guerrero will continue to work on diet, exercise and weight loss efforts. Orders and follow up as documented in patient record. Repeat labs in the next month.  Counseling Intensive lifestyle modifications are the first line treatment for this issue. . Dietary changes: Increase soluble fiber. Decrease simple carbohydrates. . Exercise changes: Moderate to vigorous-intensity aerobic activity 150 minutes per week if tolerated. . Lipid-lowering medications: see documented in medical record.  5. Class 3 severe obesity with  serious comorbidity and body mass index (BMI) of 50.0 to 59.9 in adult, unspecified obesity type (HCC) Isabella Guerrero is currently in the action stage of change. As such, her goal is to continue with weight loss efforts. She has agreed to the Category 4 Plan.   Exercise goals:  No exercise has been prescribed at this time.  Behavioral modification strategies: increasing lean protein intake, meal planning and cooking strategies and keeping healthy foods in the home.  Isabella Guerrero has agreed to follow-up with our clinic in 2-3 weeks. She was informed of the importance of frequent follow-up visits to maximize her success with intensive lifestyle modifications for her multiple health conditions.   Objective:   Blood pressure (!) 142/81, pulse 89, temperature 98.3 F (36.8 C), temperature source Oral, height 5\' 6"  (1.676 m), weight (!) 320 lb (145.2 kg), SpO2 95 %, unknown if currently breastfeeding. Body mass index is 51.65 kg/m.  General: Cooperative, alert, well developed, in no acute distress. HEENT: Conjunctivae and lids unremarkable. Cardiovascular: Regular rhythm.  Lungs: Normal work of breathing. Neurologic: No focal deficits.   Lab Results  Component Value Date   CREATININE 0.64 06/03/2020   BUN 11 06/03/2020   NA 139 06/03/2020   K 4.8 06/03/2020   CL 98 06/03/2020   CO2 28 06/03/2020   Lab Results  Component Value Date   ALT 16 06/03/2020   AST 18 06/03/2020   ALKPHOS 78 06/03/2020   BILITOT 0.2 06/03/2020   Lab Results  Component Value Date   HGBA1C 6.5 (H) 06/03/2020   Lab Results  Component Value Date   INSULIN 18.4 06/03/2020   Lab Results  Component Value Date   TSH 3.000 06/03/2020   Lab Results  Component Value Date   CHOL 224 (H) 06/03/2020   HDL 33 (L) 06/03/2020   LDLCALC 149 (H) 06/03/2020   TRIG 228 (H) 06/03/2020   Lab Results  Component Value Date   WBC 11.7 (H) 06/03/2020   HGB 13.5 06/03/2020   HCT 42.7 06/03/2020   MCV 85 06/03/2020   PLT 334 06/03/2020     Attestation Statements:   Reviewed by clinician on day of visit: allergies, medications, problem list, medical history, surgical history, family history, social history, and previous encounter notes.  Time spent on visit including pre-visit chart review  and post-visit care and charting was 25 minutes.   06/05/2020, am acting as transcriptionist for Edmund Hilda, MD.   I have reviewed the above documentation for accuracy and completeness, and I agree with the above. - Reuben Likes, MD

## 2020-10-08 NOTE — Telephone Encounter (Signed)
Please advise 

## 2020-10-14 MED FILL — VIT D2 1.25 MG (50,000 UNIT: 1.25 MG | 28 days supply | Qty: 4 | Fill #0

## 2020-10-22 ENCOUNTER — Telehealth (INDEPENDENT_AMBULATORY_CARE_PROVIDER_SITE_OTHER): Payer: Medicaid Other | Admitting: Psychology

## 2020-10-22 DIAGNOSIS — F419 Anxiety disorder, unspecified: Secondary | ICD-10-CM | POA: Diagnosis not present

## 2020-10-22 DIAGNOSIS — F5089 Other specified eating disorder: Secondary | ICD-10-CM

## 2020-10-22 NOTE — Progress Notes (Signed)
  Office: [phone]  /  Fax: [phone]    Date: October 22, 2020   Appointment Start Time: 2:00pm Duration: 34 minutes Provider: Lawerance Cruel, Psy.D. Type of Session: Individual Therapy  Location of Patient: Parked in car outside parents' home Location of Provider: Provider's Home (private office) Type of Contact: Telepsychological Visit via MyChart Video Visit  Session Content: Isabella Guerrero is a 30 y.o. female presenting for a follow-up appointment to address the previously established treatment goal of increasing coping skills. Today's appointment was a telepsychological visit due to COVID-19. Isabella Guerrero provided verbal consent for today's telepsychological appointment and she is aware she is responsible for securing confidentiality on her end of the session. Prior to proceeding with today's appointment, Isabella Guerrero's physical location at the time of this appointment was obtained as well a phone number she could be reached at in the event of technical difficulties. Isabella Guerrero and this provider participated in today's telepsychological service.   This provider conducted a brief check-in. Isabella Guerrero stated she tries to engage in other activities (e.g., reading to her daughter) instead of engaging in emotional/binge eating behaviors. Positive reinforcement was provided. She further shared she is going back to work on April 11th. Psychoeducation regarding emotional versus physical hunger was provided. Isabella Guerrero was given a handout to utilize between now and the next appointment to increase awareness of hunger patterns and subsequent eating. Isabella Guerrero provided verbal consent during today's appointment for this provider to send a handout about hunger patterns via e-mail. Discussed all or nothing thinking. Isabella Guerrero was receptive to today's appointment as evidenced by openness to sharing, responsiveness to feedback, and willingness to explore hunger patterns.  Mental Status Examination:  Appearance: well groomed and appropriate  hygiene  Behavior: appropriate to circumstances Mood: euthymic Affect: mood congruent Speech: normal in rate, volume, and tone Eye Contact: appropriate Psychomotor Activity: unable to assess  Gait: unable to assess Thought Process: linear, logical, and goal directed  Thought Content/Perception: no hallucinations, delusions, bizarre thinking or behavior reported or observed and no evidence of suicidal and homicidal ideation, plan, and intent Orientation: time, person, place, and purpose of appointment Memory/Concentration: memory, attention, language, and fund of knowledge intact  Insight/Judgment: good  Interventions:  Conducted a brief chart review Provided empathic reflections and validation Provided positive reinforcement Employed supportive psychotherapy interventions to facilitate reduced distress and to improve coping skills with identified stressors Psychoeducation provided regarding physical versus emotional hunger  Psychoeducation provided regarding all or nothing thinking   DSM-5 Diagnosis(es): 307.59 (F50.8) Other Specified Feeding or Eating Disorder, Emotional and Binge Eating Behaviors and 300.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 Isabella Guerrero has demonstrated progress in her goal as evidenced by engagement in pleasurable activities.   Plan: The next appointment will be scheduled in two weeks, which will be via MyChart Video Visit. The next session will focus on working towards the established treatment goal.

## 2020-10-22 NOTE — Progress Notes (Signed)
  Office: [phone]  /  Fax: [phone]    Date: November 05, 2020   Appointment Start Time: 2:01pm Duration: 34 minutes Provider: Lawerance Cruel, Psy.D. Type of Session: Individual Therapy  Location of Patient: Home Location of Provider: Provider's Home (private office) Type of Contact: Telepsychological Visit via MyChart Video Visit  Session Content: Isabella Guerrero is a 30 y.o. female presenting for a follow-up appointment to address the previously established treatment goal of increasing coping skills. Today's appointment was a telepsychological visit due to COVID-19. Isabella Guerrero provided verbal consent for today's telepsychological appointment and she is aware she is responsible for securing confidentiality on her end of the session. Prior to proceeding with today's appointment, Isabella Guerrero's physical location at the time of this appointment was obtained as well a phone number she could be reached at in the event of technical difficulties. Isabella Guerrero and this provider participated in today's telepsychological service.   This provider conducted a brief check-in. Isabella Guerrero reported she began taking Zoloft, noting, "I feel like a different person." She described a significant reduction in anxiety symptoms. She also discussed utilizing the previously shared handout, noting it has helped her make better choices and engage in portion control. Positive reinforcement was provided. Isabella Guerrero discussed her recent eating habits. It was refected she may be going long periods without eating. As such, psychoeducation regarding the hunger and satisfaction scale was provided. Isabella Guerrero provided verbal consent during today's appointment for this provider to send a handout with the scale via e-mail. Isabella Guerrero also expressed concern about her ability to utilize the scale when she returns to work on April 15th. She was engaged in problem solving. Isabella Guerrero was receptive to today's appointment as evidenced by openness to sharing, responsiveness to  feedback, and willingness to implement discussed strategies .  Mental Status Examination:  Appearance: well groomed and appropriate hygiene  Behavior: appropriate to circumstances Mood: anxious Affect: mood congruent Speech: normal in rate, volume, and tone Eye Contact: appropriate Psychomotor Activity: unable to assess  Gait: unable to assess Thought Process: linear, logical, and goal directed  Thought Content/Perception: no hallucinations, delusions, bizarre thinking or behavior reported or observed and no evidence of suicidal and homicidal ideation, plan, and intent Orientation: time, person, place, and purpose of appointment Memory/Concentration: memory, attention, language, and fund of knowledge intact  Insight/Judgment: fair  Interventions:  Conducted a brief chart review Provided empathic reflections and validation Reviewed content from the previous session Employed supportive psychotherapy interventions to facilitate reduced distress and to improve coping skills with identified stressors Engaged patient in problem solving Psychoeducation provided regarding the hunger and satisfaction scale  DSM-5 Diagnosis(es): 307.59 (F50.8) Other Specified Feeding or Eating Disorder, Emotional and Binge Eating Behaviors and 300.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 Isabella Guerrero has demonstrated some progress in her goal as evidenced by increased awareness of hunger patterns.   Plan: Due to insurance related concerns, Isabella Guerrero indicated she will call back to schedule a follow-up appointment once she obtains insurance from her job. No further follow-up planned by this provider.

## 2020-10-23 ENCOUNTER — Ambulatory Visit (INDEPENDENT_AMBULATORY_CARE_PROVIDER_SITE_OTHER): Payer: Medicaid Other | Admitting: Family Medicine

## 2020-10-28 ENCOUNTER — Other Ambulatory Visit (INDEPENDENT_AMBULATORY_CARE_PROVIDER_SITE_OTHER): Payer: Self-pay | Admitting: Family Medicine

## 2020-10-28 ENCOUNTER — Other Ambulatory Visit: Payer: Self-pay

## 2020-10-28 ENCOUNTER — Ambulatory Visit (INDEPENDENT_AMBULATORY_CARE_PROVIDER_SITE_OTHER): Payer: Medicaid Other | Admitting: Family Medicine

## 2020-10-28 ENCOUNTER — Encounter (INDEPENDENT_AMBULATORY_CARE_PROVIDER_SITE_OTHER): Payer: Self-pay | Admitting: Family Medicine

## 2020-10-28 ENCOUNTER — Encounter (INDEPENDENT_AMBULATORY_CARE_PROVIDER_SITE_OTHER): Payer: Self-pay

## 2020-10-28 VITALS — BP 120/84 | HR 91 | Temp 98.6°F | Ht 66.0 in | Wt 323.0 lb

## 2020-10-28 DIAGNOSIS — F419 Anxiety disorder, unspecified: Secondary | ICD-10-CM

## 2020-10-28 DIAGNOSIS — F32A Depression, unspecified: Secondary | ICD-10-CM

## 2020-10-28 DIAGNOSIS — E559 Vitamin D deficiency, unspecified: Secondary | ICD-10-CM

## 2020-10-28 DIAGNOSIS — Z6841 Body Mass Index (BMI) 40.0 and over, adult: Secondary | ICD-10-CM

## 2020-10-28 DIAGNOSIS — E1165 Type 2 diabetes mellitus with hyperglycemia: Secondary | ICD-10-CM | POA: Diagnosis not present

## 2020-10-28 DIAGNOSIS — I10 Essential (primary) hypertension: Secondary | ICD-10-CM

## 2020-10-28 MED ORDER — VITAMIN D (ERGOCALCIFEROL) 1.25 MG (50000 UNIT) PO CAPS: 50000.0000 [IU] | ORAL_CAPSULE | ORAL | 0 refills | Status: DC

## 2020-10-28 MED ORDER — METOPROLOL SUCCINATE ER 25 MG PO TB24: 25.0000 mg | ORAL_TABLET | Freq: Every day | ORAL | 1 refills | Status: DC

## 2020-10-28 MED ORDER — SERTRALINE HCL 50 MG PO TABS: 50.0000 mg | ORAL_TABLET | Freq: Every day | ORAL | 0 refills | Status: DC

## 2020-10-28 MED ORDER — TRULICITY 0.75 MG/0.5ML ~~LOC~~ SOAJ
0.7500 mg | SUBCUTANEOUS | 0 refills | Status: DC
Start: 2020-10-28 — End: 2020-10-28

## 2020-10-28 MED FILL — TRULICITY 0.75 MG/0.5 ML PE: 0.75 | 28 days supply | Qty: 2 | Fill #0

## 2020-10-28 MED FILL — SERTRALINE HCL 50 MG TABS: 50 | 90 days supply | Qty: 90 | Fill #0

## 2020-10-28 MED FILL — PANTOPRAZOLE SOD DR 20 MG T: 20 | 30 days supply | Qty: 30 | Fill #1

## 2020-10-28 NOTE — Telephone Encounter (Signed)
Last seen by Dr. Ukleja. 

## 2020-10-28 NOTE — Telephone Encounter (Signed)
Message sent to pt-[facility] 

## 2020-10-29 NOTE — Progress Notes (Signed)
Chief Complaint:   OBESITY Isabella Guerrero is here to discuss her progress with her obesity treatment plan along with follow-up of her obesity related diagnoses. Isabella Guerrero is on the Category 4 Plan and states she is following her eating plan approximately 60% of the time. Isabella Guerrero states she is doing 0 minutes 0 times per week.  Today's visit was #: 10 Starting weight: 344 lbs Starting date: 06/03/2020 Today's weight: 323 lbs Today's date: 10/28/2020 Total lbs lost to date: 21 lbs Total lbs lost since last in-office visit: 0  Interim History: The last few weeks, Isabella Guerrero has been doing some spring cleaning, planting flowers, and helping her parents. She has been doing some excessive snacking, which she reports may be linked to increase in stress. She is panning to go to the grocery store today.  Subjective:   1. Type 2 diabetes mellitus with hyperglycemia, without long-term current use of insulin (HCC) Isabella Guerrero is on Trulicity 0.75 mg SubQ weekly. She denies GI side effects.  2. Essential hypertension Isabella Guerrero's BP is controlled today. She is on HCTZ 12.5 mg and metoprolol 25 mg daily. Pt denies chest pain, chest pressure and headache.  3. Vitamin D deficiency Isabella Guerrero denies nausea, vomiting, and muscle weakness but notes fatigue. She is on prescription Vit D.  4. Anxiety and depression Isabella Guerrero denies suicidal or homicidal ideations. She is on Buspar with no real change in anxiety symptoms.  Assessment/Plan:   1. Type 2 diabetes mellitus with hyperglycemia, without long-term current use of insulin (HCC) Good blood sugar control is important to decrease the likelihood of diabetic complications such as nephropathy, neuropathy, limb loss, blindness, coronary artery disease, and death. Intensive lifestyle modification including diet, exercise and weight loss are the first line of treatment for diabetes. Refill Trulicity, as per below. Refill Protonix 20 mg PO daily.  - Dulaglutide (TRULICITY) 0.75  MG/0.5ML SOPN; Inject 0.75 mg into the skin once a week.  Dispense: 2 mL; Refill: 0  2. Essential hypertension Breshay is working on healthy weight loss and exercise to improve blood pressure control. We will watch for signs of hypotension as she continues her lifestyle modifications.   - metoprolol succinate (TOPROL-XL) 25 MG 24 hr tablet; Take 1 tablet (25 mg total) by mouth daily.  Dispense: 90 tablet; Refill: 1  3. Vitamin D deficiency Low Vitamin D level contributes to fatigue and are associated with obesity, breast, and colon cancer. She agrees to continue to take prescription Vitamin D @50 ,000 IU every week and will follow-up for routine testing of Vitamin D, at least 2-3 times per year to avoid over-replacement.  - Vitamin D, Ergocalciferol, (DRISDOL) 1.25 MG (50000 UNIT) CAPS capsule; Take 1 capsule (50,000 Units total) by mouth every 7 (seven) days.  Dispense: 4 capsule; Refill: 0  4. Anxiety and depression Behavior modification techniques were discussed today to help Isabella Guerrero deal with her anxiety.  Orders and follow up as documented in patient record. Start Zoloft 50 mg, as per below. Continue Buspar.  - sertraline (ZOLOFT) 50 MG tablet; Take 1 tablet (50 mg total) by mouth daily.  Dispense: 90 tablet; Refill: 0  5. Class 3 severe obesity with serious comorbidity and body mass index (BMI) of 50.0 to 59.9 in adult, unspecified obesity type (HCC) Isabella Guerrero is currently in the action stage of change. As such, her goal is to continue with weight loss efforts. She has agreed to the Category 4 Plan.   Exercise goals: As is  Behavioral modification strategies: increasing lean protein  intake, meal planning and cooking strategies, keeping healthy foods in the home and planning for success.  Isabella Guerrero has agreed to follow-up with our clinic in 2-3 weeks. She was informed of the importance of frequent follow-up visits to maximize her success with intensive lifestyle modifications for her multiple  health conditions.   Objective:   Blood pressure 120/84, pulse 91, temperature 98.6 F (37 C), temperature source Oral, height 5\' 6"  (1.676 m), weight (!) 323 lb (146.5 kg), SpO2 96 %, unknown if currently breastfeeding. Body mass index is 52.13 kg/m.  General: Cooperative, alert, well developed, in no acute distress. HEENT: Conjunctivae and lids unremarkable. Cardiovascular: Regular rhythm.  Lungs: Normal work of breathing. Neurologic: No focal deficits.   Lab Results  Component Value Date   CREATININE 0.64 06/03/2020   BUN 11 06/03/2020   NA 139 06/03/2020   K 4.8 06/03/2020   CL 98 06/03/2020   CO2 28 06/03/2020   Lab Results  Component Value Date   ALT 16 06/03/2020   AST 18 06/03/2020   ALKPHOS 78 06/03/2020   BILITOT 0.2 06/03/2020   Lab Results  Component Value Date   HGBA1C 6.5 (H) 06/03/2020   Lab Results  Component Value Date   INSULIN 18.4 06/03/2020   Lab Results  Component Value Date   TSH 3.000 06/03/2020   Lab Results  Component Value Date   CHOL 224 (H) 06/03/2020   HDL 33 (L) 06/03/2020   LDLCALC 149 (H) 06/03/2020   TRIG 228 (H) 06/03/2020   Lab Results  Component Value Date   WBC 11.7 (H) 06/03/2020   HGB 13.5 06/03/2020   HCT 42.7 06/03/2020   MCV 85 06/03/2020   PLT 334 06/03/2020    Attestation Statements:   Reviewed by clinician on day of visit: allergies, medications, problem list, medical history, surgical history, family history, social history, and previous encounter notes.  06/05/2020, am acting as transcriptionist for Edmund Hilda, MD.   I have reviewed the above documentation for accuracy and completeness, and I agree with the above. - Reuben Likes, MD

## 2020-11-01 ENCOUNTER — Other Ambulatory Visit (HOSPITAL_BASED_OUTPATIENT_CLINIC_OR_DEPARTMENT_OTHER): Payer: Self-pay

## 2020-11-05 ENCOUNTER — Telehealth (INDEPENDENT_AMBULATORY_CARE_PROVIDER_SITE_OTHER): Payer: Medicaid Other | Admitting: Psychology

## 2020-11-05 DIAGNOSIS — F419 Anxiety disorder, unspecified: Secondary | ICD-10-CM | POA: Diagnosis not present

## 2020-11-05 DIAGNOSIS — F5089 Other specified eating disorder: Secondary | ICD-10-CM | POA: Diagnosis not present

## 2020-11-06 MED FILL — METOPROLOL SUCCINATE ER 25: 25 | 90 days supply | Qty: 90 | Fill #1

## 2020-11-06 MED FILL — HYDROCHLOROTHIAZIDE 12.5 MG: 12.5 | 90 days supply | Qty: 90 | Fill #1

## 2020-11-15 ENCOUNTER — Other Ambulatory Visit (HOSPITAL_COMMUNITY): Payer: Self-pay

## 2020-12-10 ENCOUNTER — Other Ambulatory Visit (HOSPITAL_COMMUNITY): Payer: Self-pay

## 2020-12-10 MED FILL — Pantoprazole Sodium EC Tab 20 MG (Base Equiv): ORAL | 30 days supply | Qty: 30 | Fill #0 | Status: AC

## 2020-12-19 ENCOUNTER — Other Ambulatory Visit (HOSPITAL_COMMUNITY): Payer: Self-pay

## 2020-12-19 ENCOUNTER — Other Ambulatory Visit: Payer: Self-pay

## 2020-12-19 ENCOUNTER — Encounter (INDEPENDENT_AMBULATORY_CARE_PROVIDER_SITE_OTHER): Payer: Self-pay | Admitting: Family Medicine

## 2020-12-19 ENCOUNTER — Ambulatory Visit (INDEPENDENT_AMBULATORY_CARE_PROVIDER_SITE_OTHER): Payer: 59 | Admitting: Family Medicine

## 2020-12-19 VITALS — BP 128/84 | HR 83 | Temp 98.7°F | Ht 66.0 in | Wt 317.0 lb

## 2020-12-19 DIAGNOSIS — Z9189 Other specified personal risk factors, not elsewhere classified: Secondary | ICD-10-CM | POA: Diagnosis not present

## 2020-12-19 DIAGNOSIS — E559 Vitamin D deficiency, unspecified: Secondary | ICD-10-CM | POA: Diagnosis not present

## 2020-12-19 DIAGNOSIS — Z6841 Body Mass Index (BMI) 40.0 and over, adult: Secondary | ICD-10-CM | POA: Diagnosis not present

## 2020-12-19 DIAGNOSIS — E1165 Type 2 diabetes mellitus with hyperglycemia: Secondary | ICD-10-CM | POA: Diagnosis not present

## 2020-12-19 MED ORDER — VITAMIN D (ERGOCALCIFEROL) 1.25 MG (50000 UNIT) PO CAPS
ORAL_CAPSULE | ORAL | 0 refills | Status: DC
Start: 2020-12-19 — End: 2021-01-02
  Filled 2020-12-19: qty 4, 28d supply, fill #0

## 2020-12-19 MED ORDER — DULAGLUTIDE 0.75 MG/0.5ML ~~LOC~~ SOAJ
SUBCUTANEOUS | 0 refills | Status: DC
  Filled 2020-12-19: qty 2, 28d supply, fill #0

## 2020-12-23 NOTE — Progress Notes (Signed)
Chief Complaint:   OBESITY Isabella Guerrero is here to discuss her progress with her obesity treatment plan along with follow-up of her obesity related diagnoses. Isabella Guerrero is on the Category 4 Plan and states she is following her eating plan approximately 60-70% of the time. Isabella Guerrero states she is walking 60 minutes 3 times per week.  Today's visit was #: 11 Starting weight: 344 lbs Starting date: 06/03/2020 Today's weight: 317 lbs Today's date: 12/19/2020 Total lbs lost to date: 27 lbs Total lbs lost since last in-office visit: 6  Interim History: Isabella Guerrero is currently working Friday through Sunday 5:30 PM to 6 AM weekly. 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 She celebrated Mother's Day and her birthday together and had a family get together.  Subjective:   1. Vitamin D deficiency Pt denies nausea, vomiting, and muscle weakness but notes fatigue. Pt is on prescription Vit D.  2. Type 2 diabetes mellitus with hyperglycemia, without long-term current use of insulin (HCC) Pt was previously on Trulicity but has been out for 3 weeks. She is not on Metformin currently.  3. At risk for side effect of medication Isabella Guerrero is at risk for side effects of medication due to restarting Trulicity.  Assessment/Plan:   1. Vitamin D deficiency Low Vitamin D level contributes to fatigue and are associated with obesity, breast, and colon cancer. She agrees to continue to take prescription Vitamin D @50 ,000 IU every week and will follow-up for routine testing of Vitamin D, at least 2-3 times per year to avoid over-replacement.  - Vitamin D, Ergocalciferol, (DRISDOL) 1.25 MG (50000 UNIT) CAPS capsule; TAKE 1 CAPSULE BY MOUTH EVERY 7 DAYS  Dispense: 4 capsule; Refill: 0  2. Type 2 diabetes mellitus with hyperglycemia, without long-term current use of insulin (HCC) Good blood sugar control is important to decrease the likelihood of  diabetic complications such as nephropathy, neuropathy, limb loss, blindness, coronary artery disease, and death. Intensive lifestyle modification including diet, exercise and weight loss are the first line of treatment for diabetes.   - Dulaglutide 0.75 MG/0.5ML SOPN; INJECT 0.75 MG (1 PEN) INTO THE SKIN ONCE A WEEK.  Dispense: 2 mL; Refill: 0  3. At risk for side effect of medication Isabella Guerrero was given approximately 15 minutes of drug side effect counseling today.  We discussed side effect possibility and risk versus benefits. Isabella Guerrero agreed to the medication and will contact this office if these side effects are intolerable.  Repetitive spaced learning was employed today to elicit superior memory formation and behavioral change.  4. Class 3 severe obesity with serious comorbidity and body mass index (BMI) of 50.0 to 59.9 in adult, unspecified obesity type (HCC)  Isabella Guerrero is currently in the action stage of change. As such, her goal is to continue with weight loss efforts. She has agreed to the Category 4 Plan.   Exercise goals: No exercise has been prescribed at this time.  Behavioral modification strategies: increasing lean protein intake, meal planning and cooking strategies and keeping healthy foods in the home.  Isabella Guerrero has agreed to follow-up with our clinic in 2-3 weeks. She was informed of the importance of frequent follow-up visits to maximize her success with intensive lifestyle modifications for her multiple health conditions.   Objective:   Blood pressure 128/84, pulse 83, temperature 98.7 F (37.1 C), height 5\' 6"  (1.676 m), weight (!) 317 lb (143.8 kg), SpO2 95 %,  unknown if currently breastfeeding. Body mass index is 51.17 kg/m.  General: Cooperative, alert, well developed, in no acute distress. HEENT: Conjunctivae and lids unremarkable. Cardiovascular: Regular rhythm.  Lungs: Normal work of breathing. Neurologic: No focal deficits.   Lab Results  Component Value Date    CREATININE 0.64 06/03/2020   BUN 11 06/03/2020   NA 139 06/03/2020   K 4.8 06/03/2020   CL 98 06/03/2020   CO2 28 06/03/2020   Lab Results  Component Value Date   ALT 16 06/03/2020   AST 18 06/03/2020   ALKPHOS 78 06/03/2020   BILITOT 0.2 06/03/2020   Lab Results  Component Value Date   HGBA1C 6.5 (H) 06/03/2020   Lab Results  Component Value Date   INSULIN 18.4 06/03/2020   Lab Results  Component Value Date   TSH 3.000 06/03/2020   Lab Results  Component Value Date   CHOL 224 (H) 06/03/2020   HDL 33 (L) 06/03/2020   LDLCALC 149 (H) 06/03/2020   TRIG 228 (H) 06/03/2020   Lab Results  Component Value Date   WBC 11.7 (H) 06/03/2020   HGB 13.5 06/03/2020   HCT 42.7 06/03/2020   MCV 85 06/03/2020   PLT 334 06/03/2020   No results found for: IRON, TIBC, FERRITIN  Attestation Statements:   Reviewed by clinician on day of visit: allergies, medications, problem list, medical history, surgical history, family history, social history, and previous encounter notes.  Edmund Hilda, CMA, am acting as transcriptionist for Reuben Likes, MD.   I have reviewed the above documentation for accuracy and completeness, and I agree with the above. - [name] Mires, MD

## 2021-01-02 ENCOUNTER — Other Ambulatory Visit (HOSPITAL_COMMUNITY): Payer: Self-pay

## 2021-01-02 ENCOUNTER — Other Ambulatory Visit: Payer: Self-pay

## 2021-01-02 ENCOUNTER — Ambulatory Visit (INDEPENDENT_AMBULATORY_CARE_PROVIDER_SITE_OTHER): Payer: 59 | Admitting: Family Medicine

## 2021-01-02 ENCOUNTER — Encounter (INDEPENDENT_AMBULATORY_CARE_PROVIDER_SITE_OTHER): Payer: Self-pay | Admitting: Family Medicine

## 2021-01-02 VITALS — BP 120/78 | HR 81 | Temp 98.3°F | Ht 66.0 in | Wt 315.0 lb

## 2021-01-02 DIAGNOSIS — Z9189 Other specified personal risk factors, not elsewhere classified: Secondary | ICD-10-CM | POA: Diagnosis not present

## 2021-01-02 DIAGNOSIS — K219 Gastro-esophageal reflux disease without esophagitis: Secondary | ICD-10-CM | POA: Diagnosis not present

## 2021-01-02 DIAGNOSIS — Z6841 Body Mass Index (BMI) 40.0 and over, adult: Secondary | ICD-10-CM | POA: Diagnosis not present

## 2021-01-02 DIAGNOSIS — F419 Anxiety disorder, unspecified: Secondary | ICD-10-CM | POA: Diagnosis not present

## 2021-01-02 DIAGNOSIS — E1165 Type 2 diabetes mellitus with hyperglycemia: Secondary | ICD-10-CM | POA: Diagnosis not present

## 2021-01-02 DIAGNOSIS — E559 Vitamin D deficiency, unspecified: Secondary | ICD-10-CM | POA: Diagnosis not present

## 2021-01-02 DIAGNOSIS — F32A Depression, unspecified: Secondary | ICD-10-CM | POA: Diagnosis not present

## 2021-01-02 DIAGNOSIS — I10 Essential (primary) hypertension: Secondary | ICD-10-CM | POA: Diagnosis not present

## 2021-01-02 MED ORDER — DULAGLUTIDE 0.75 MG/0.5ML ~~LOC~~ SOAJ
SUBCUTANEOUS | 0 refills | Status: DC
Start: 2021-01-02 — End: 2021-01-28
  Filled 2021-01-02: qty 2, 28d supply, fill #0

## 2021-01-02 MED ORDER — VITAMIN D (ERGOCALCIFEROL) 1.25 MG (50000 UNIT) PO CAPS
ORAL_CAPSULE | ORAL | 0 refills | Status: DC
  Filled 2021-01-02: qty 4, 28d supply, fill #0

## 2021-01-02 MED ORDER — SERTRALINE HCL 50 MG PO TABS
ORAL_TABLET | Freq: Every day | ORAL | 0 refills | Status: DC
  Filled 2021-01-02: qty 90, 90d supply, fill #0

## 2021-01-02 MED ORDER — BUSPIRONE HCL 5 MG PO TABS
ORAL_TABLET | Freq: Three times a day (TID) | ORAL | 0 refills | Status: DC | PRN
  Filled 2021-01-02: qty 90, 30d supply, fill #0

## 2021-01-02 MED ORDER — METOPROLOL SUCCINATE ER 25 MG PO TB24
ORAL_TABLET | Freq: Every day | ORAL | 1 refills | Status: DC
  Filled 2021-01-02: qty 90, 90d supply, fill #0

## 2021-01-02 MED ORDER — PANTOPRAZOLE SODIUM 20 MG PO TBEC
DELAYED_RELEASE_TABLET | Freq: Every day | ORAL | 11 refills | Status: DC
  Filled 2021-01-02: qty 30, 30d supply, fill #0

## 2021-01-02 MED ORDER — HYDROCHLOROTHIAZIDE 12.5 MG PO CAPS
ORAL_CAPSULE | Freq: Every day | ORAL | 1 refills | Status: DC
  Filled 2021-01-02: qty 90, 90d supply, fill #0

## 2021-01-03 ENCOUNTER — Other Ambulatory Visit (HOSPITAL_COMMUNITY): Payer: Self-pay

## 2021-01-04 ENCOUNTER — Other Ambulatory Visit (HOSPITAL_COMMUNITY): Payer: Self-pay

## 2021-01-07 ENCOUNTER — Other Ambulatory Visit (HOSPITAL_COMMUNITY): Payer: Self-pay

## 2021-01-08 ENCOUNTER — Other Ambulatory Visit (HOSPITAL_COMMUNITY): Payer: Self-pay

## 2021-01-08 NOTE — Progress Notes (Signed)
Chief Complaint:   OBESITY Isabella Guerrero is here to discuss her progress with her obesity treatment plan along with follow-up of her obesity related diagnoses. Isabella Guerrero is on the Category 4 Plan and states she is following her eating plan approximately 65-75% of the time. Isabella Guerrero states she is walking 60 minutes 1-2 times per week.  Today's visit was #: 12 Starting weight: 344 lbs Starting date: 06/03/2020 Today's weight: 315 lbs Today's date: 01/02/2021 Total lbs lost to date: 29 Total lbs lost since last in-office visit: 2  Interim History: Isabella Guerrero has been trying to stay on meal plan. She finds it to be harder when she is at work, as she tends to crave wings and french fries. Pt is working for the holiday weekend. She want to stick closer to her meal plan.  Subjective:   1. Anxiety and depression Isabella Guerrero is feeling much better on meds. Pt denies suicidal or homicidal ideations.  2. Vitamin D deficiency Isabella Guerrero denies nausea, vomiting, and muscle weakness but notes fatigue. Pt is on prescription Vit D.  3. Type 2 diabetes mellitus with hyperglycemia, without long-term current use of insulin (HCC) Isabella Guerrero is doing well on Trulicity. Her last A1c 6.5 and insulin level 18.4. She is noticing some improvement in portion control.  4. Essential hypertension Isabella Guerrero's BP is well controlled today. Pt denies chest pain/chest pressure/headache.  5. Gastroesophageal reflux disease, unspecified whether esophagitis present Symptoms well managed on Protonix. No increase in symptoms with GLP-1.  6. At risk for heart disease Isabella Guerrero is at a higher than average risk for cardiovascular disease due to obesity.   Assessment/Plan:   1. Anxiety and depression Behavior modification techniques were discussed today to help Isabella Guerrero deal with her anxiety.  Orders and follow up as documented in patient record. Behavior modification techniques were discussed today to help Isabella Guerrero deal with her emotional/non-hunger  eating behaviors.  Orders and follow up as documented in patient record.  - busPIRone (BUSPAR) 5 MG tablet; TAKE 1 TABLET BY MOUTH 3 TIMES DAILY AS NEEDED FOR ANXIETY  Dispense: 90 tablet; Refill: 0 - sertraline (ZOLOFT) 50 MG tablet; TAKE 1 TABLET BY MOUTH ONCE A DAY  Dispense: 90 tablet; Refill: 0  2. Vitamin D deficiency Low Vitamin D level contributes to fatigue and are associated with obesity, breast, and colon cancer. She agrees to continue to take prescription Vitamin D @50 ,000 IU every week and will follow-up for routine testing of Vitamin D, at least 2-3 times per year to avoid over-replacement. - VITAMIN D 25 Hydroxy (Vit-D Deficiency, Fractures) - Vitamin D, Ergocalciferol, (DRISDOL) 1.25 MG (50000 UNIT) CAPS capsule; TAKE 1 CAPSULE BY MOUTH EVERY 7 DAYS  Dispense: 4 capsule; Refill: 0  3. Type 2 diabetes mellitus with hyperglycemia, without long-term current use of insulin (HCC) Good blood sugar control is important to decrease the likelihood of diabetic complications such as nephropathy, neuropathy, limb loss, blindness, coronary artery disease, and death. Intensive lifestyle modification including diet, exercise and weight loss are the first line of treatment for diabetes.  - Hemoglobin A1c - Insulin, random - Lipid Panel With LDL/HDL Ratio - Dulaglutide 0.75 MG/0.5ML SOPN; INJECT 0.75 MG (1 PEN) INTO THE SKIN ONCE A WEEK.  Dispense: 2 mL; Refill: 0  4. Essential hypertension Isabella Guerrero is working on healthy weight loss and exercise to improve blood pressure control. We will watch for signs of hypotension as she continues her lifestyle modifications. - Comprehensive metabolic panel - hydrochlorothiazide (MICROZIDE) 12.5 MG capsule; TAKE 1 CAPSULE  BY MOUTH ONCE A DAY  Dispense: 90 capsule; Refill: 1 - metoprolol succinate (TOPROL-XL) 25 MG 24 hr tablet; TAKE 1 TABLET BY MOUTH ONCE A DAY  Dispense: 90 tablet; Refill: 1  5. Gastroesophageal reflux disease, unspecified whether esophagitis  present Intensive lifestyle modifications are the first line treatment for this issue. We discussed several lifestyle modifications today and she will continue to work on diet, exercise and weight loss efforts. Orders and follow up as documented in patient record.   Counseling If a person has gastroesophageal reflux disease (GERD), food and stomach acid move back up into the esophagus and cause symptoms or problems such as damage to the esophagus. Anti-reflux measures include: raising the head of the bed, avoiding tight clothing or belts, avoiding eating late at night, not lying down shortly after mealtime, and achieving weight loss. Avoid ASA, NSAID's, caffeine, alcohol, and tobacco.  OTC Pepcid and/or Tums are often very helpful for as needed use.  However, for persisting chronic or daily symptoms, stronger medications like Omeprazole may be needed. You may need to avoid foods and drinks such as: Coffee and tea (with or without caffeine). Drinks that contain alcohol. Energy drinks and sports drinks. Bubbly (carbonated) drinks or sodas. Chocolate and cocoa. Peppermint and mint flavorings. Garlic and onions. Horseradish. Spicy and acidic foods. These include peppers, chili powder, curry powder, vinegar, hot sauces, and BBQ sauce. Citrus fruit juices and citrus fruits, such as oranges, lemons, and limes. Tomato-based foods. These include red sauce, chili, salsa, and pizza with red sauce. Fried and fatty foods. These include donuts, french fries, potato chips, and high-fat dressings. High-fat meats. These include hot dogs, rib eye steak, sausage, ham, and bacon.  - pantoprazole (PROTONIX) 20 MG tablet; TAKE 1 TABLET BY MOUTH DAILY.  Dispense: 30 tablet; Refill: 11  6. At risk for heart disease Isabella Guerrero was given approximately 15 minutes of coronary artery disease prevention counseling today. She is 30 y.o. female and has risk factors for heart disease including obesity. We discussed intensive  lifestyle modifications today with an emphasis on specific weight loss instructions and strategies.   Repetitive spaced learning was employed today to elicit superior memory formation and behavioral change.  7. Class 3 severe obesity with serious comorbidity and body mass index (BMI) of 50.0 to 59.9 in adult, unspecified obesity type (HCC) Isabella Guerrero is currently in the action stage of change. As such, her goal is to continue with weight loss efforts. She has agreed to the Category 4 Plan.   Exercise goals:  As is  Behavioral modification strategies: increasing lean protein intake, meal planning and cooking strategies, keeping healthy foods in the home and planning for success.  Isabella Guerrero has agreed to follow-up with our clinic in 3-4 weeks, fasting. She was informed of the importance of frequent follow-up visits to maximize her success with intensive lifestyle modifications for her multiple health conditions.   Isabella Guerrero was informed we would discuss her lab results at her next visit unless there is a critical issue that needs to be addressed sooner. Isabella Guerrero agreed to keep her next visit at the agreed upon time to discuss these results.  Objective:   Blood pressure 120/78, pulse 81, temperature 98.3 F (36.8 C), height 5\' 6"  (1.676 m), weight (!) 315 lb (142.9 kg), SpO2 96 %, unknown if currently breastfeeding. Body mass index is 50.84 kg/m.  General: Cooperative, alert, well developed, in no acute distress. HEENT: Conjunctivae and lids unremarkable. Cardiovascular: Regular rhythm.  Lungs: Normal work of breathing. Neurologic:  No focal deficits.   Lab Results  Component Value Date   CREATININE 0.64 06/03/2020   BUN 11 06/03/2020   NA 139 06/03/2020   K 4.8 06/03/2020   CL 98 06/03/2020   CO2 28 06/03/2020   Lab Results  Component Value Date   ALT 16 06/03/2020   AST 18 06/03/2020   ALKPHOS 78 06/03/2020   BILITOT 0.2 06/03/2020   Lab Results  Component Value Date   HGBA1C 6.5 (H)  06/03/2020   Lab Results  Component Value Date   INSULIN 18.4 06/03/2020   Lab Results  Component Value Date   TSH 3.000 06/03/2020   Lab Results  Component Value Date   CHOL 224 (H) 06/03/2020   HDL 33 (L) 06/03/2020   LDLCALC 149 (H) 06/03/2020   TRIG 228 (H) 06/03/2020   Lab Results  Component Value Date   WBC 11.7 (H) 06/03/2020   HGB 13.5 06/03/2020   HCT 42.7 06/03/2020   MCV 85 06/03/2020   PLT 334 06/03/2020   No results found for: IRON, TIBC, FERRITIN   Attestation Statements:   Reviewed by clinician on day of visit: allergies, medications, problem list, medical history, surgical history, family history, social history, and previous encounter notes.  Edmund Hilda, CMA, am acting as transcriptionist for Reuben Likes, MD.   I have reviewed the above documentation for accuracy and completeness, and I agree with the above. - [name] Mires, MD

## 2021-01-10 ENCOUNTER — Other Ambulatory Visit (HOSPITAL_COMMUNITY): Payer: Self-pay

## 2021-01-13 ENCOUNTER — Other Ambulatory Visit (HOSPITAL_COMMUNITY): Payer: Self-pay

## 2021-01-16 ENCOUNTER — Other Ambulatory Visit (INDEPENDENT_AMBULATORY_CARE_PROVIDER_SITE_OTHER): Payer: Self-pay

## 2021-01-16 DIAGNOSIS — E1169 Type 2 diabetes mellitus with other specified complication: Secondary | ICD-10-CM

## 2021-01-16 DIAGNOSIS — E559 Vitamin D deficiency, unspecified: Secondary | ICD-10-CM

## 2021-01-18 ENCOUNTER — Other Ambulatory Visit (HOSPITAL_COMMUNITY): Payer: Self-pay

## 2021-01-20 DIAGNOSIS — E1169 Type 2 diabetes mellitus with other specified complication: Secondary | ICD-10-CM | POA: Diagnosis not present

## 2021-01-20 DIAGNOSIS — E559 Vitamin D deficiency, unspecified: Secondary | ICD-10-CM | POA: Diagnosis not present

## 2021-01-20 DIAGNOSIS — E785 Hyperlipidemia, unspecified: Secondary | ICD-10-CM | POA: Diagnosis not present

## 2021-01-21 LAB — COMPREHENSIVE METABOLIC PANEL
ALT: 10 IU/L (ref 0–32)
AST: 12 IU/L (ref 0–40)
Albumin/Globulin Ratio: 1.4 (ref 1.2–2.2)
Albumin: 3.9 g/dL (ref 3.8–4.8)
Alkaline Phosphatase: 66 IU/L (ref 44–121)
BUN/Creatinine Ratio: 23 (ref 9–23)
BUN: 14 mg/dL (ref 6–20)
Bilirubin Total: <0.2 mg/dL (ref 0.0–1.2)
CO2: 26 mmol/L (ref 20–29)
Calcium: 9.5 mg/dL (ref 8.7–10.2)
Chloride: 99 mmol/L (ref 96–106)
Creatinine, Ser: 0.61 mg/dL (ref 0.57–1.00)
Globulin, Total: 2.8 g/dL (ref 1.5–4.5)
Glucose: 87 mg/dL (ref 65–99)
Potassium: 4.2 mmol/L (ref 3.5–5.2)
Sodium: 139 mmol/L (ref 134–144)
Total Protein: 6.7 g/dL (ref 6.0–8.5)
eGFR: 122 mL/min/{1.73_m2} (ref >59)

## 2021-01-21 LAB — VITAMIN D 25 HYDROXY (VIT D DEFICIENCY, FRACTURES): Vit D, 25-Hydroxy: 26.7 ng/mL — ABNORMAL LOW (ref 30.0–100.0)

## 2021-01-21 LAB — LIPID PANEL WITH LDL/HDL RATIO
Cholesterol, Total: 221 mg/dL — ABNORMAL HIGH (ref 100–199)
HDL: 39 mg/dL — ABNORMAL LOW (ref >39)
LDL Chol Calc (NIH): 156 mg/dL — ABNORMAL HIGH (ref 0–99)
LDL/HDL Ratio: 4 ratio — ABNORMAL HIGH (ref 0.0–3.2)
Triglycerides: 140 mg/dL (ref 0–149)
VLDL Cholesterol Cal: 26 mg/dL (ref 5–40)

## 2021-01-21 LAB — HEMOGLOBIN A1C
Est. average glucose Bld gHb Est-mCnc: 128 mg/dL
Hgb A1c MFr Bld: 6.1 % — ABNORMAL HIGH (ref 4.8–5.6)

## 2021-01-21 LAB — INSULIN, RANDOM: INSULIN: 15.5 u[IU]/mL (ref 2.6–24.9)

## 2021-01-27 ENCOUNTER — Encounter (INDEPENDENT_AMBULATORY_CARE_PROVIDER_SITE_OTHER): Payer: Self-pay

## 2021-01-28 ENCOUNTER — Ambulatory Visit (INDEPENDENT_AMBULATORY_CARE_PROVIDER_SITE_OTHER): Payer: 59 | Admitting: Family Medicine

## 2021-01-28 ENCOUNTER — Encounter (INDEPENDENT_AMBULATORY_CARE_PROVIDER_SITE_OTHER): Payer: Self-pay | Admitting: Family Medicine

## 2021-01-28 ENCOUNTER — Other Ambulatory Visit (HOSPITAL_COMMUNITY): Payer: Self-pay

## 2021-01-28 ENCOUNTER — Other Ambulatory Visit: Payer: Self-pay

## 2021-01-28 VITALS — BP 143/85 | HR 98 | Temp 98.9°F | Ht 66.0 in | Wt 315.0 lb

## 2021-01-28 DIAGNOSIS — Z6841 Body Mass Index (BMI) 40.0 and over, adult: Secondary | ICD-10-CM

## 2021-01-28 DIAGNOSIS — F419 Anxiety disorder, unspecified: Secondary | ICD-10-CM

## 2021-01-28 DIAGNOSIS — E559 Vitamin D deficiency, unspecified: Secondary | ICD-10-CM

## 2021-01-28 DIAGNOSIS — Z9189 Other specified personal risk factors, not elsewhere classified: Secondary | ICD-10-CM

## 2021-01-28 DIAGNOSIS — E1169 Type 2 diabetes mellitus with other specified complication: Secondary | ICD-10-CM

## 2021-01-28 DIAGNOSIS — F32A Depression, unspecified: Secondary | ICD-10-CM | POA: Diagnosis not present

## 2021-01-28 MED ORDER — OZEMPIC (0.25 OR 0.5 MG/DOSE) 2 MG/1.5ML ~~LOC~~ SOPN
0.2500 mg | PEN_INJECTOR | SUBCUTANEOUS | 0 refills | Status: DC
  Filled 2021-01-28: qty 1.5, 56d supply, fill #0

## 2021-01-28 MED ORDER — SERTRALINE HCL 50 MG PO TABS: 75.0000 mg | ORAL_TABLET | Freq: Every day | ORAL | 0 refills | Status: DC

## 2021-01-29 ENCOUNTER — Encounter (INDEPENDENT_AMBULATORY_CARE_PROVIDER_SITE_OTHER): Payer: Self-pay | Admitting: Family Medicine

## 2021-01-29 ENCOUNTER — Other Ambulatory Visit (HOSPITAL_COMMUNITY): Payer: Self-pay

## 2021-01-31 ENCOUNTER — Other Ambulatory Visit (HOSPITAL_COMMUNITY): Payer: Self-pay

## 2021-02-03 NOTE — Progress Notes (Signed)
Chief Complaint:   OBESITY Isabella Guerrero is here to discuss her progress with her obesity treatment plan along with follow-up of her obesity related diagnoses. Isabella Guerrero is on the Category 4 Plan and states she is following her eating plan approximately 50% of the time. Isabella Guerrero states she is walking and using fee weight 20-60 minutes 1-2 times per week.  Today's visit was #: 13 Starting weight: 344 lbs Starting date: 06/03/2020 Today's weight: 315 lbs Today's date: 01/28/2021 Total lbs lost to date: 29 Total lbs lost since last in-office visit: 0  Interim History: Isabella Guerrero has been working and keeping up with household duties over the last few weeks. She can't afford Trulicity anymore due to it being $120/month. She is following the plan ~50% of the time. She finds eating on plan is easier at work. Pt is interested in starting berberine.  Subjective:   1. Vitamin D deficiency Isabella Guerrero denies nausea, vomiting, and muscle weakness but notes fatigue. She is on prescription Vit D.  2. Anxiety and depression Isabella Guerrero denies suicidal or homicidal ideations. She is doing ok on Zoloft and Buspar.  3. Type 2 diabetes mellitus with other specified complication, without long-term current use of insulin (HCC) Isabella Guerrero is on Trulicity but medication is very costly. Improvement in A1c on GLP-1.  4. At risk for depression Isabella Guerrero is at elevated risk of depression due to one or more of the following: family history, significant life stressors, medical conditions and/or poor nutrition.   Assessment/Plan:   1. Vitamin D deficiency Low Vitamin D level contributes to fatigue and are associated with obesity, breast, and colon cancer. She agrees to continue to take prescription Vitamin D @50 ,000 IU every week and will follow-up for routine testing of Vitamin D, at least 2-3 times per year to avoid over-replacement.  2. Anxiety and depression Behavior modification techniques were discussed today to help Isabella Guerrero deal  with her anxiety.  Orders and follow up as documented in patient record. Refer to Isabella Guerrero for psychology. Pt is to continue Buspar and Zoloft. She will increase Zoloft to 75 mg daily.  - sertraline (ZOLOFT) 50 MG tablet; Take 1.5 tablets (75 mg total) by mouth daily.  Dispense: 30 tablet; Refill: 0  - Ambulatory referral to Psychology  3. Type 2 diabetes mellitus with other specified complication, without long-term current use of insulin (HCC) Good blood sugar control is important to decrease the likelihood of diabetic complications such as nephropathy, neuropathy, limb loss, blindness, coronary artery disease, and death. Intensive lifestyle modification including diet, exercise and weight loss are the first line of treatment for diabetes. Stop Trulicity. Start Ozempic 0.25 mg, as prescribed below.  - Semaglutide,0.25 or 0.5MG /DOS, (OZEMPIC, 0.25 OR 0.5 MG/DOSE,) 2 MG/1.5ML SOPN; Inject 0.25 mg into the skin once a week.  Dispense: 1.5 mL; Refill: 0  4. At risk for depression Isabella Guerrero was given approximately 15 minutes of depression risk counseling today. She has risk factors for depression. We discussed the importance of a healthy work life balance, a healthy relationship with food and a good support system.  Repetitive spaced learning was employed today to elicit superior memory formation and behavioral change.  5. Class 3 severe obesity with serious comorbidity and body mass index (BMI) of 50.0 to 59.9 in adult, unspecified obesity type (HCC)  Isabella Guerrero is currently in the action stage of change. As such, her goal is to continue with weight loss efforts. She has agreed to the Category 4 Plan.   Exercise goals:  As is  Behavioral modification strategies: increasing lean protein intake, meal planning and cooking strategies, keeping healthy foods in the home, and planning for success.  Isabella Guerrero has agreed to follow-up with our clinic in 3 weeks. She was informed of the importance of frequent  follow-up visits to maximize her success with intensive lifestyle modifications for her multiple health conditions.   Objective:   Blood pressure (!) 143/85, pulse 98, temperature 98.9 F (37.2 C), height 5\' 6"  (1.676 m), weight (!) 315 lb (142.9 kg), SpO2 97 %, unknown if currently breastfeeding. Body mass index is 50.84 kg/m.  General: Cooperative, alert, well developed, in no acute distress. HEENT: Conjunctivae and lids unremarkable. Cardiovascular: Regular rhythm.  Lungs: Normal work of breathing. Neurologic: No focal deficits.   Lab Results  Component Value Date   CREATININE 0.61 01/20/2021   BUN 14 01/20/2021   NA 139 01/20/2021   K 4.2 01/20/2021   CL 99 01/20/2021   CO2 26 01/20/2021   Lab Results  Component Value Date   ALT 10 01/20/2021   AST 12 01/20/2021   ALKPHOS 66 01/20/2021   BILITOT <0.2 01/20/2021   Lab Results  Component Value Date   HGBA1C 6.1 (H) 01/20/2021   HGBA1C 6.5 (H) 06/03/2020   Lab Results  Component Value Date   INSULIN 15.5 01/20/2021   INSULIN 18.4 06/03/2020   Lab Results  Component Value Date   TSH 3.000 06/03/2020   Lab Results  Component Value Date   CHOL 221 (H) 01/20/2021   HDL 39 (L) 01/20/2021   LDLCALC 156 (H) 01/20/2021   TRIG 140 01/20/2021   Lab Results  Component Value Date   WBC 11.7 (H) 06/03/2020   HGB 13.5 06/03/2020   HCT 42.7 06/03/2020   MCV 85 06/03/2020   PLT 334 06/03/2020   No results found for: IRON, TIBC, FERRITIN  Attestation Statements:   Reviewed by clinician on day of visit: allergies, medications, problem list, medical history, surgical history, family history, social history, and previous encounter notes.  06/05/2020, CMA, am acting as transcriptionist for Edmund Hilda, MD.  I have reviewed the above documentation for accuracy and completeness, and I agree with the above. - Reuben Likes, MD

## 2021-02-04 ENCOUNTER — Other Ambulatory Visit (HOSPITAL_BASED_OUTPATIENT_CLINIC_OR_DEPARTMENT_OTHER): Payer: Self-pay

## 2021-02-05 ENCOUNTER — Encounter (INDEPENDENT_AMBULATORY_CARE_PROVIDER_SITE_OTHER): Payer: Self-pay

## 2021-02-13 ENCOUNTER — Other Ambulatory Visit: Payer: Self-pay

## 2021-02-13 ENCOUNTER — Encounter (INDEPENDENT_AMBULATORY_CARE_PROVIDER_SITE_OTHER): Payer: Self-pay | Admitting: Family Medicine

## 2021-02-13 ENCOUNTER — Ambulatory Visit (INDEPENDENT_AMBULATORY_CARE_PROVIDER_SITE_OTHER): Payer: 59 | Admitting: Family Medicine

## 2021-02-13 ENCOUNTER — Other Ambulatory Visit (HOSPITAL_COMMUNITY): Payer: Self-pay

## 2021-02-13 VITALS — BP 127/82 | HR 90 | Temp 98.2°F | Ht 66.0 in | Wt 315.0 lb

## 2021-02-13 DIAGNOSIS — K219 Gastro-esophageal reflux disease without esophagitis: Secondary | ICD-10-CM | POA: Diagnosis not present

## 2021-02-13 DIAGNOSIS — Z6841 Body Mass Index (BMI) 40.0 and over, adult: Secondary | ICD-10-CM

## 2021-02-13 DIAGNOSIS — Z9189 Other specified personal risk factors, not elsewhere classified: Secondary | ICD-10-CM | POA: Diagnosis not present

## 2021-02-13 DIAGNOSIS — E559 Vitamin D deficiency, unspecified: Secondary | ICD-10-CM

## 2021-02-13 MED ORDER — VITAMIN D (ERGOCALCIFEROL) 1.25 MG (50000 UNIT) PO CAPS
ORAL_CAPSULE | ORAL | 0 refills | Status: DC
  Filled 2021-02-13: qty 4, 28d supply, fill #0

## 2021-02-13 MED ORDER — PANTOPRAZOLE SODIUM 20 MG PO TBEC
DELAYED_RELEASE_TABLET | Freq: Every day | ORAL | 0 refills | Status: DC
Start: 2021-02-13 — End: 2021-05-13
  Filled 2021-02-13: qty 90, 90d supply, fill #0

## 2021-02-17 ENCOUNTER — Other Ambulatory Visit (HOSPITAL_COMMUNITY): Payer: Self-pay

## 2021-02-19 NOTE — Progress Notes (Signed)
Chief Complaint:   OBESITY Isabella Guerrero is here to discuss her progress with her obesity treatment plan along with follow-up of her obesity related diagnoses. Isabella Guerrero is on the Category 4 Plan and states she is following her eating plan approximately 50% of the time. Isabella Guerrero states she is not currently exercising.  Today's visit was #: 14 Starting weight: 344 lbs Starting date: 06/03/2020 Today's weight: 310 lbs Today's date: 02/13/2021 Total lbs lost to date: 34 Total lbs lost since last in-office visit: 5  Interim History: Isabella Guerrero is surprised with weight loss, as she has been eating more indulgently frequently. She has been eating out more. Her daughter's birthday is at the end of the month and they are having a pool party (2nd birthday). Food wise, she wants to stick more to the plan. Work is about to increase in demands.  Subjective:   1. Vitamin D deficiency Pt denies nausea, vomiting, and muscle weakness but notes fatigue. Pt is on prescription Vit D.  2. Gastroesophageal reflux disease, unspecified whether esophagitis present She is on Protonix with some improvement in symptoms.  3. At risk for osteoporosis Isabella Guerrero is at higher risk of osteopenia and osteoporosis due to Vitamin D deficiency.   Assessment/Plan:   1. Vitamin D deficiency Low Vitamin D level contributes to fatigue and are associated with obesity, breast, and colon cancer. She agrees to continue to take prescription Vitamin D @50 ,000 IU every week and will follow-up for routine testing of Vitamin D, at least 2-3 times per year to avoid over-replacement.  Refill- Vitamin D, Ergocalciferol, (DRISDOL) 1.25 MG (50000 UNIT) CAPS capsule; TAKE 1 CAPSULE BY MOUTH EVERY 7 DAYS  Dispense: 4 capsule; Refill: 0  2. Gastroesophageal reflux disease, unspecified whether esophagitis present Intensive lifestyle modifications are the first line treatment for this issue. We discussed several lifestyle modifications today and she will  continue to work on diet, exercise and weight loss efforts. Orders and follow up as documented in patient record.   Counseling If a person has gastroesophageal reflux disease (GERD), food and stomach acid move back up into the esophagus and cause symptoms or problems such as damage to the esophagus. Anti-reflux measures include: raising the head of the bed, avoiding tight clothing or belts, avoiding eating late at night, not lying down shortly after mealtime, and achieving weight loss. Avoid ASA, NSAID's, caffeine, alcohol, and tobacco.  OTC Pepcid and/or Tums are often very helpful for as needed use.  However, for persisting chronic or daily symptoms, stronger medications like Omeprazole may be needed. You may need to avoid foods and drinks such as: Coffee and tea (with or without caffeine). Drinks that contain alcohol. Energy drinks and sports drinks. Bubbly (carbonated) drinks or sodas. Chocolate and cocoa. Peppermint and mint flavorings. Garlic and onions. Horseradish. Spicy and acidic foods. These include peppers, chili powder, curry powder, vinegar, hot sauces, and BBQ sauce. Citrus fruit juices and citrus fruits, such as oranges, lemons, and limes. Tomato-based foods. These include red sauce, chili, salsa, and pizza with red sauce. Fried and fatty foods. These include donuts, french fries, potato chips, and high-fat dressings. High-fat meats. These include hot dogs, rib eye steak, sausage, ham, and bacon.  Refill- pantoprazole (PROTONIX) 20 MG tablet; TAKE 1 TABLET BY MOUTH DAILY.  Dispense: 90 tablet; Refill: 0  3. At risk for osteoporosis Isabella Guerrero was given approximately 15 minutes of osteoporosis prevention counseling today. Isabella Guerrero is at risk for osteopenia and osteoporosis due to her Vitamin D deficiency. She was  encouraged to take her Vitamin D and follow her higher calcium diet and increase strengthening exercise to help strengthen her bones and decrease her risk of osteopenia  and osteoporosis.  Repetitive spaced learning was employed today to elicit superior memory formation and behavioral change.  4. Class 3 severe obesity with serious comorbidity and body mass index (BMI) of 50.0 to 59.9 in adult, unspecified obesity type (HCC)  Isabella Guerrero is currently in the action stage of change. As such, her goal is to continue with weight loss efforts. She has agreed to the Category 4 Plan.   Exercise goals: No exercise has been prescribed at this time.  Behavioral modification strategies: increasing lean protein intake, meal planning and cooking strategies, keeping healthy foods in the home, and planning for success.  Isabella Guerrero has agreed to follow-up with our clinic in 3 weeks. She was informed of the importance of frequent follow-up visits to maximize her success with intensive lifestyle modifications for her multiple health conditions.   Objective:   Blood pressure 127/82, pulse 90, temperature 98.2 F (36.8 C), height 5\' 6"  (1.676 m), weight (!) 315 lb (142.9 kg), SpO2 97 %, unknown if currently breastfeeding. Body mass index is 50.84 kg/m.  General: Cooperative, alert, well developed, in no acute distress. HEENT: Conjunctivae and lids unremarkable. Cardiovascular: Regular rhythm.  Lungs: Normal work of breathing. Neurologic: No focal deficits.   Lab Results  Component Value Date   CREATININE 0.61 01/20/2021   BUN 14 01/20/2021   NA 139 01/20/2021   K 4.2 01/20/2021   CL 99 01/20/2021   CO2 26 01/20/2021   Lab Results  Component Value Date   ALT 10 01/20/2021   AST 12 01/20/2021   ALKPHOS 66 01/20/2021   BILITOT <0.2 01/20/2021   Lab Results  Component Value Date   HGBA1C 6.1 (H) 01/20/2021   HGBA1C 6.5 (H) 06/03/2020   Lab Results  Component Value Date   INSULIN 15.5 01/20/2021   INSULIN 18.4 06/03/2020   Lab Results  Component Value Date   TSH 3.000 06/03/2020   Lab Results  Component Value Date   CHOL 221 (H) 01/20/2021   HDL 39 (L)  01/20/2021   LDLCALC 156 (H) 01/20/2021   TRIG 140 01/20/2021   Lab Results  Component Value Date   VD25OH 26.7 (L) 01/20/2021   VD25OH 20.8 (L) 06/03/2020   Lab Results  Component Value Date   WBC 11.7 (H) 06/03/2020   HGB 13.5 06/03/2020   HCT 42.7 06/03/2020   MCV 85 06/03/2020   PLT 334 06/03/2020   No results found for: IRON, TIBC, FERRITIN  Attestation Statements:   Reviewed by clinician on day of visit: allergies, medications, problem list, medical history, surgical history, family history, social history, and previous encounter notes.  06/05/2020, CMA, am acting as transcriptionist for Edmund Hilda, MD.   I have reviewed the above documentation for accuracy and completeness, and I agree with the above. - Reuben Likes, MD

## 2021-02-21 ENCOUNTER — Other Ambulatory Visit (INDEPENDENT_AMBULATORY_CARE_PROVIDER_SITE_OTHER): Payer: Self-pay | Admitting: Family Medicine

## 2021-02-21 ENCOUNTER — Other Ambulatory Visit (HOSPITAL_COMMUNITY): Payer: Self-pay

## 2021-02-21 DIAGNOSIS — F32A Depression, unspecified: Secondary | ICD-10-CM

## 2021-02-21 DIAGNOSIS — F419 Anxiety disorder, unspecified: Secondary | ICD-10-CM

## 2021-02-28 ENCOUNTER — Encounter: Payer: Self-pay | Admitting: Nurse Practitioner

## 2021-02-28 ENCOUNTER — Telehealth: Payer: 59 | Admitting: Nurse Practitioner

## 2021-02-28 VITALS — Temp 97.9°F | Ht 66.0 in

## 2021-02-28 DIAGNOSIS — U071 COVID-19: Secondary | ICD-10-CM | POA: Diagnosis not present

## 2021-02-28 MED ORDER — NIRMATRELVIR/RITONAVIR (PAXLOVID)TABLET: 3.0000 | ORAL_TABLET | Freq: Two times a day (BID) | ORAL | 0 refills | Status: AC

## 2021-02-28 MED ORDER — FLUTICASONE PROPIONATE 50 MCG/ACT NA SUSP: 2.0000 | Freq: Every day | NASAL | 5 refills | Status: DC

## 2021-02-28 NOTE — Progress Notes (Signed)
Virtual Visit via Video Note  I connected withNAME@ on 02/28/21 at  1:30 PM EDT by a video enabled telemedicine application and verified that I am speaking with the correct person using two identifiers.  Location: Patient:Home Provider: Office Participants: patient and provider  I discussed the limitations of evaluation and management by telemedicine and the availability of in person appointments. I also discussed with the patient that there may be a patient responsible charge related to this service. The patient expressed understanding and agreed to proceed.  BS:[id number] COVID 3days ago  History of Present Illness: URI  This is a new problem. The current episode started in the past 7 days (3days ago). The problem has been unchanged. The maximum temperature recorded prior to her arrival was 100.4 - 100.9 F. Associated symptoms include congestion, coughing, ear pain, headaches, joint pain, a plugged ear sensation, rhinorrhea and sinus pain. Pertinent negatives include no abdominal pain, chest pain, diarrhea, dysuria, joint swelling, nausea, neck pain, rash, sneezing, sore throat, swollen glands, vomiting or wheezing. She has tried decongestant, acetaminophen and increased fluids for the symptoms. The treatment provided mild relief.    has received 3doses of COVID vaccine.  Observations/Objective: Physical Exam Constitutional:      Appearance: She is obese.  Pulmonary:     Effort: Pulmonary effort is normal.  Neurological:     Mental Status: She is alert and oriented to person, place, and time.  Psychiatric:        Behavior: Behavior normal.        Thought Content: Thought content normal.    Assessment and Plan: Starlena was seen today for covid positive.  Diagnoses and all orders for this visit:  COVID-19 -     nirmatrelvir/ritonavir EUA (PAXLOVID) TABS; Take 3 tablets by mouth 2 (two) times daily for 5 days. Patient [id number] Take nirmatrelvir (150 mg) two tablets twice daily for  5 days and ritonavir (100 mg) one tablet twice daily for 5 days.  Other orders -     fluticasone (FLONASE) 50 MCG/ACT nasal spray; Place 2 sprays into both nostrils daily.   Follow Up Instructions: With medical hx of DM, HTN and morbid Obesity, she will benefit from use of Paxlovid to prevent and/or decrease her risk of hospitalization and severe COVID complication. She is aware to use condoms in combination with nexplanon to prevent pregnancy for next 2weeks. ED precautions provided.   I discussed the assessment and treatment plan with the patient. The patient was provided an opportunity to ask questions and all were answered. The patient agreed with the plan and demonstrated an understanding of the instructions.   The patient was advised to call back or seek an in-person evaluation if the symptoms worsen or if the condition fails to improve as anticipated.  Alysia Penna, NP

## 2021-03-06 ENCOUNTER — Ambulatory Visit (INDEPENDENT_AMBULATORY_CARE_PROVIDER_SITE_OTHER): Payer: 59 | Admitting: Family Medicine

## 2021-03-18 ENCOUNTER — Ambulatory Visit (INDEPENDENT_AMBULATORY_CARE_PROVIDER_SITE_OTHER): Payer: 59 | Admitting: Family Medicine

## 2021-03-18 ENCOUNTER — Other Ambulatory Visit (INDEPENDENT_AMBULATORY_CARE_PROVIDER_SITE_OTHER): Payer: Self-pay | Admitting: Family Medicine

## 2021-03-18 ENCOUNTER — Telehealth (INDEPENDENT_AMBULATORY_CARE_PROVIDER_SITE_OTHER): Payer: Medicaid Other | Admitting: Psychology

## 2021-03-18 ENCOUNTER — Other Ambulatory Visit (HOSPITAL_COMMUNITY): Payer: Self-pay

## 2021-03-18 ENCOUNTER — Other Ambulatory Visit: Payer: Self-pay

## 2021-03-18 ENCOUNTER — Encounter (INDEPENDENT_AMBULATORY_CARE_PROVIDER_SITE_OTHER): Payer: Self-pay | Admitting: Family Medicine

## 2021-03-18 VITALS — BP 123/83 | HR 86 | Temp 98.6°F | Ht 66.0 in | Wt 311.0 lb

## 2021-03-18 DIAGNOSIS — E559 Vitamin D deficiency, unspecified: Secondary | ICD-10-CM | POA: Diagnosis not present

## 2021-03-18 DIAGNOSIS — E1165 Type 2 diabetes mellitus with hyperglycemia: Secondary | ICD-10-CM

## 2021-03-18 DIAGNOSIS — Z9189 Other specified personal risk factors, not elsewhere classified: Secondary | ICD-10-CM

## 2021-03-18 DIAGNOSIS — Z6841 Body Mass Index (BMI) 40.0 and over, adult: Secondary | ICD-10-CM

## 2021-03-18 MED ORDER — OZEMPIC (0.25 OR 0.5 MG/DOSE) 2 MG/1.5ML ~~LOC~~ SOPN
0.5000 mg | PEN_INJECTOR | SUBCUTANEOUS | 0 refills | Status: DC
Start: 2021-03-18 — End: 2021-04-15
  Filled 2021-03-18 – 2021-04-03 (×2): qty 1.5, 28d supply, fill #0

## 2021-03-18 MED ORDER — VITAMIN D (ERGOCALCIFEROL) 1.25 MG (50000 UNIT) PO CAPS
ORAL_CAPSULE | ORAL | 0 refills | Status: DC
  Filled 2021-03-18: qty 4, 28d supply, fill #0

## 2021-03-19 NOTE — Progress Notes (Signed)
Chief Complaint:   OBESITY Isabella Guerrero is here to discuss her progress with her obesity treatment plan along with follow-up of her obesity related diagnoses. Isabella Guerrero is on the Category 4 Plan and states she is following her eating plan approximately 45% of the time. Isabella Guerrero states she is walking at work for 4 hours 3 times per week.   Today's visit was #: 15 Starting weight: 344 lbs Starting date: 06/03/2020 Today's weight: 311 lbs Today's date: 03/18/2021 Total lbs lost to date: 33 Total lbs lost since last in-office visit: 4  Interim History: Isabella Guerrero had difficulty eating due to COVID and she took antivirals. She ate whatever she could tolerate. She is feeling better now. Foodwise, she is sticking to the meal plan. The next few weeks is work, her daughter's birthday, and work. She is going to the [employer] the first week of September.   Subjective:   1. Type 2 diabetes mellitus with hyperglycemia, without long-term current use of insulin (HCC) Isabella Guerrero notes her hunger is not as good of controlled with Ozempic as it was with Trulicity. Last A1c was 6.1.  2. Vitamin D deficiency Isabella Guerrero denies nausea, vomiting, or muscle weakness, but she notes fatigue. She is on prescription Vit D.  3. At risk for side effect of medication Isabella Guerrero is at risk for drug side effects due to increased dose of Ozempic.  Assessment/Plan:   1. Type 2 diabetes mellitus with hyperglycemia, without long-term current use of insulin (HCC) Isabella Guerrero agreed to increased Ozempic to 0.5 mg SubQ weekly with no refills. Good blood sugar control is important to decrease the likelihood of diabetic complications such as nephropathy, neuropathy, limb loss, blindness, coronary artery disease, and death. Intensive lifestyle modification including diet, exercise and weight loss are the first line of treatment for diabetes.   - Semaglutide,0.25 or 0.5MG /DOS, (OZEMPIC, 0.25 OR 0.5 MG/DOSE,) 2 MG/1.5ML SOPN; Inject 0.5 mg into the skin once a  week.  Dispense: 1.5 mL; Refill: 0  2. Vitamin D deficiency Low Vitamin D level contributes to fatigue and are associated with obesity, breast, and colon cancer. We will refill prescription Vitamin D 50,000 IU every week for 1 month. Isabella Guerrero will follow-up for routine testing of Vitamin D, at least 2-3 times per year to avoid over-replacement.  - Vitamin D, Ergocalciferol, (DRISDOL) 1.25 MG (50000 UNIT) CAPS capsule; TAKE 1 CAPSULE BY MOUTH EVERY 7 DAYS  Dispense: 4 capsule; Refill: 0  3. At risk for side effect of medication Isabella Guerrero was given approximately 15 minutes of drug side effect counseling today.  We discussed side effect possibility and risk versus benefits. Isabella Guerrero agreed to the medication and will contact this office if these side effects are intolerable.  Repetitive spaced learning was employed today to elicit superior memory formation and behavioral change.  4. Obesity with current BMI of 50.3 Isabella Guerrero is currently in the action stage of change. As such, her goal is to continue with weight loss efforts. She has agreed to the Category 4 Plan.   Exercise goals: All adults should avoid inactivity. Some physical activity is better than none, and adults who participate in any amount of physical activity gain some health benefits.  Behavioral modification strategies: increasing lean protein intake, meal planning and cooking strategies, keeping healthy foods in the home, and planning for success.  Isabella Guerrero has agreed to follow-up with our clinic in 3 weeks. She was informed of the importance of frequent follow-up visits to maximize her success with intensive lifestyle modifications for  her multiple health conditions.   Objective:   Blood pressure 123/83, pulse 86, temperature 98.6 F (37 C), height 5\' 6"  (1.676 m), weight (!) 311 lb (141.1 kg), last menstrual period 03/06/2021, SpO2 98 %. Body mass index is 50.2 kg/m.  General: Cooperative, alert, well developed, in no acute  distress. HEENT: Conjunctivae and lids unremarkable. Cardiovascular: Regular rhythm.  Lungs: Normal work of breathing. Neurologic: No focal deficits.   Lab Results  Component Value Date   CREATININE 0.61 01/20/2021   BUN 14 01/20/2021   NA 139 01/20/2021   K 4.2 01/20/2021   CL 99 01/20/2021   CO2 26 01/20/2021   Lab Results  Component Value Date   ALT 10 01/20/2021   AST 12 01/20/2021   ALKPHOS 66 01/20/2021   BILITOT <0.2 01/20/2021   Lab Results  Component Value Date   HGBA1C 6.1 (H) 01/20/2021   HGBA1C 6.5 (H) 06/03/2020   Lab Results  Component Value Date   INSULIN 15.5 01/20/2021   INSULIN 18.4 06/03/2020   Lab Results  Component Value Date   TSH 3.000 06/03/2020   Lab Results  Component Value Date   CHOL 221 (H) 01/20/2021   HDL 39 (L) 01/20/2021   LDLCALC 156 (H) 01/20/2021   TRIG 140 01/20/2021   Lab Results  Component Value Date   VD25OH 26.7 (L) 01/20/2021   VD25OH 20.8 (L) 06/03/2020   Lab Results  Component Value Date   WBC 11.7 (H) 06/03/2020   HGB 13.5 06/03/2020   HCT 42.7 06/03/2020   MCV 85 06/03/2020   PLT 334 06/03/2020   No results found for: IRON, TIBC, FERRITIN  Attestation Statements:   Reviewed by clinician on day of visit: allergies, medications, problem list, medical history, surgical history, family history, social history, and previous encounter notes.   I, 06/05/2020, am acting as transcriptionist for Burt Knack, MD.  I have reviewed the above documentation for accuracy and completeness, and I agree with the above. - Reuben Likes, MD

## 2021-04-03 ENCOUNTER — Other Ambulatory Visit (HOSPITAL_COMMUNITY): Payer: Self-pay

## 2021-04-15 ENCOUNTER — Other Ambulatory Visit (HOSPITAL_COMMUNITY): Payer: Self-pay

## 2021-04-15 ENCOUNTER — Encounter (INDEPENDENT_AMBULATORY_CARE_PROVIDER_SITE_OTHER): Payer: Self-pay | Admitting: Family Medicine

## 2021-04-15 ENCOUNTER — Ambulatory Visit (INDEPENDENT_AMBULATORY_CARE_PROVIDER_SITE_OTHER): Payer: 59 | Admitting: Family Medicine

## 2021-04-15 ENCOUNTER — Other Ambulatory Visit: Payer: Self-pay

## 2021-04-15 VITALS — BP 135/84 | HR 92 | Temp 98.7°F | Ht 66.0 in | Wt 310.0 lb

## 2021-04-15 DIAGNOSIS — E1165 Type 2 diabetes mellitus with hyperglycemia: Secondary | ICD-10-CM | POA: Diagnosis not present

## 2021-04-15 DIAGNOSIS — I10 Essential (primary) hypertension: Secondary | ICD-10-CM

## 2021-04-15 DIAGNOSIS — F419 Anxiety disorder, unspecified: Secondary | ICD-10-CM | POA: Diagnosis not present

## 2021-04-15 DIAGNOSIS — E66813 Obesity, class 3: Secondary | ICD-10-CM

## 2021-04-15 DIAGNOSIS — E559 Vitamin D deficiency, unspecified: Secondary | ICD-10-CM | POA: Diagnosis not present

## 2021-04-15 DIAGNOSIS — F32A Depression, unspecified: Secondary | ICD-10-CM | POA: Diagnosis not present

## 2021-04-15 DIAGNOSIS — Z6841 Body Mass Index (BMI) 40.0 and over, adult: Secondary | ICD-10-CM

## 2021-04-15 DIAGNOSIS — Z9189 Other specified personal risk factors, not elsewhere classified: Secondary | ICD-10-CM | POA: Diagnosis not present

## 2021-04-15 MED ORDER — METOPROLOL SUCCINATE ER 25 MG PO TB24
ORAL_TABLET | Freq: Every day | ORAL | 1 refills | Status: DC
  Filled 2021-04-15: qty 90, 90d supply, fill #0

## 2021-04-15 MED ORDER — BUSPIRONE HCL 5 MG PO TABS
ORAL_TABLET | Freq: Three times a day (TID) | ORAL | 0 refills | Status: DC | PRN
  Filled 2021-04-15: qty 90, 30d supply, fill #0

## 2021-04-15 MED ORDER — VITAMIN D (ERGOCALCIFEROL) 1.25 MG (50000 UNIT) PO CAPS
ORAL_CAPSULE | ORAL | 0 refills | Status: DC
  Filled 2021-04-15: qty 4, 28d supply, fill #0

## 2021-04-15 MED ORDER — OZEMPIC (0.25 OR 0.5 MG/DOSE) 2 MG/1.5ML ~~LOC~~ SOPN
0.2500 mg | PEN_INJECTOR | SUBCUTANEOUS | 0 refills | Status: DC
Start: 2021-04-15 — End: 2021-05-13
  Filled 2021-04-15: qty 1.5, 56d supply, fill #0

## 2021-04-15 MED ORDER — HYDROCHLOROTHIAZIDE 12.5 MG PO CAPS
ORAL_CAPSULE | Freq: Every day | ORAL | 1 refills | Status: DC
Start: 2021-04-15 — End: 2021-11-12
  Filled 2021-04-15: qty 90, 90d supply, fill #0

## 2021-04-16 NOTE — Progress Notes (Signed)
Chief Complaint:   OBESITY Isabella Guerrero is here to discuss her progress with her obesity treatment plan along with follow-up of her obesity related diagnoses. Isabella Guerrero is on the Category 4 Plan and states she is following her eating plan approximately ?% of the time. Isabella Guerrero states she is walking and pool 30-60 minutes 3 times per week.  Today's visit was #: 16 Starting weight: 344 lbs Starting date: 06/03/2020 Today's weight: 310 lbs Today's date: 04/15/2021 Total lbs lost to date: 34 Total lbs lost since last in-office visit: 1  Interim History: Isabella Guerrero celebrated her daughter's birthday and had COVID and her family also had COVID. She really didn't eat much during COVID and had significant taste distortion for about 2 weeks. She plans to get back to category 4 and getting all food in (wants to be more strict on plan in the next few weeks).  Subjective:   1. Essential hypertension BP controlled. Pt denies chest pain/chest pressure/headache. Isabella Guerrero is very compliant on medication.  2. Vitamin D deficiency She denies nausea, vomiting, and muscle weakness but notes fatigue. Pt is on prescription Vit D.  3. Type 2 diabetes mellitus with hyperglycemia, without long-term current use of insulin (HCC) Isabella Guerrero is on Ozempic 0.5 mg. She is having very significant decline in appetite on 0.5 mg dose.   4. Anxiety and depression Pt is on Buspar and Zoloft. She is feeling good on combo.  5. At risk for deficient intake of food Isabella Guerrero is at risk for deficient intake of food due to increasing dose of Ozempic.  Assessment/Plan:   1. Essential hypertension Isabella Guerrero is working on healthy weight loss and exercise to improve blood pressure control. We will watch for signs of hypotension as she continues her lifestyle modifications.  Refill- metoprolol succinate (TOPROL-XL) 25 MG 24 hr tablet; TAKE 1 TABLET BY MOUTH ONCE A DAY  Dispense: 90 tablet; Refill: 1 Refill- hydrochlorothiazide (MICROZIDE) 12.5 MG  capsule; TAKE 1 CAPSULE BY MOUTH ONCE A DAY  Dispense: 90 capsule; Refill: 1  2. Vitamin D deficiency Low Vitamin D level contributes to fatigue and are associated with obesity, breast, and colon cancer. She agrees to continue to take prescription Vitamin D 50,000 IU every week and will follow-up for routine testing of Vitamin D, at least 2-3 times per year to avoid over-replacement.  Refill- Vitamin D, Ergocalciferol, (DRISDOL) 1.25 MG (50000 UNIT) CAPS capsule; TAKE 1 CAPSULE BY MOUTH EVERY 7 DAYS  Dispense: 4 capsule; Refill: 0  3. Type 2 diabetes mellitus with hyperglycemia, without long-term current use of insulin (HCC) Isabella Guerrero will decrease Ozempic to 0.25 mg weekly. Good blood sugar control is important to decrease the likelihood of diabetic complications such as nephropathy, neuropathy, limb loss, blindness, coronary artery disease, and death. Intensive lifestyle modification including diet, exercise and weight loss are the first line of treatment for diabetes.   Decrease and Refill- Semaglutide,0.25 or 0.5MG /DOS, (OZEMPIC, 0.25 OR 0.5 MG/DOSE,) 2 MG/1.5ML SOPN; Inject 0.25 mg into the skin once a week.  Dispense: 1.5 mL; Refill: 0  4. Anxiety and depression Behavior modification techniques were discussed today to help Isabella Guerrero deal with her anxiety.  Orders and follow up as documented in patient record.   Refill- busPIRone (BUSPAR) 5 MG tablet; TAKE 1 TABLET BY MOUTH 3 TIMES DAILY AS NEEDED FOR ANXIETY  Dispense: 90 tablet; Refill: 0  5. At risk for deficient intake of food Isabella Guerrero was given approximately 15 minutes of deficit intake of food prevention counseling today. Isabella Guerrero  is at risk for eating too few calories based on current food recall. She was encouraged to focus on meeting caloric and protein goals according to her recommended meal plan.    6. Obesity with current BMI of 50.0  Isabella Guerrero is currently in the action stage of change. As such, her goal is to continue with weight loss  efforts. She has agreed to the Category 4 Plan.   Exercise goals: All adults should avoid inactivity. Some physical activity is better than none, and adults who participate in any amount of physical activity gain some health benefits.  Behavioral modification strategies: increasing lean protein intake, meal planning and cooking strategies, and keeping healthy foods in the home.  Isabella Guerrero has agreed to follow-up with our clinic in 2-3 weeks. She was informed of the importance of frequent follow-up visits to maximize her success with intensive lifestyle modifications for her multiple health conditions.   Objective:   Blood pressure 135/84, pulse 92, temperature 98.7 F (37.1 C), height 5\' 6"  (1.676 m), weight (!) 310 lb (140.6 kg), SpO2 97 %. Body mass index is 50.04 kg/m.  General: Cooperative, alert, well developed, in no acute distress. HEENT: Conjunctivae and lids unremarkable. Cardiovascular: Regular rhythm.  Lungs: Normal work of breathing. Neurologic: No focal deficits.   Lab Results  Component Value Date   CREATININE 0.61 01/20/2021   BUN 14 01/20/2021   NA 139 01/20/2021   K 4.2 01/20/2021   CL 99 01/20/2021   CO2 26 01/20/2021   Lab Results  Component Value Date   ALT 10 01/20/2021   AST 12 01/20/2021   ALKPHOS 66 01/20/2021   BILITOT <0.2 01/20/2021   Lab Results  Component Value Date   HGBA1C 6.1 (H) 01/20/2021   HGBA1C 6.5 (H) 06/03/2020   Lab Results  Component Value Date   INSULIN 15.5 01/20/2021   INSULIN 18.4 06/03/2020   Lab Results  Component Value Date   TSH 3.000 06/03/2020   Lab Results  Component Value Date   CHOL 221 (H) 01/20/2021   HDL 39 (L) 01/20/2021   LDLCALC 156 (H) 01/20/2021   TRIG 140 01/20/2021   Lab Results  Component Value Date   VD25OH 26.7 (L) 01/20/2021   VD25OH 20.8 (L) 06/03/2020   Lab Results  Component Value Date   WBC 11.7 (H) 06/03/2020   HGB 13.5 06/03/2020   HCT 42.7 06/03/2020   MCV 85 06/03/2020   PLT  334 06/03/2020    Attestation Statements:   Reviewed by clinician on day of visit: allergies, medications, problem list, medical history, surgical history, family history, social history, and previous encounter notes.  06/05/2020, CMA, am acting as transcriptionist for Edmund Hilda, MD.   I have reviewed the above documentation for accuracy and completeness, and I agree with the above. - Reuben Likes, MD

## 2021-05-07 ENCOUNTER — Encounter (INDEPENDENT_AMBULATORY_CARE_PROVIDER_SITE_OTHER): Payer: Self-pay

## 2021-05-07 ENCOUNTER — Other Ambulatory Visit (INDEPENDENT_AMBULATORY_CARE_PROVIDER_SITE_OTHER): Payer: Self-pay | Admitting: Family Medicine

## 2021-05-07 DIAGNOSIS — K219 Gastro-esophageal reflux disease without esophagitis: Secondary | ICD-10-CM

## 2021-05-07 NOTE — Telephone Encounter (Signed)
Message sent to pt-[facility] 

## 2021-05-07 NOTE — Telephone Encounter (Signed)
Pt last seen by Dr. Ukleja.  

## 2021-05-08 ENCOUNTER — Other Ambulatory Visit (HOSPITAL_COMMUNITY): Payer: Self-pay

## 2021-05-08 ENCOUNTER — Other Ambulatory Visit (INDEPENDENT_AMBULATORY_CARE_PROVIDER_SITE_OTHER): Payer: Self-pay | Admitting: Family Medicine

## 2021-05-08 DIAGNOSIS — F32A Depression, unspecified: Secondary | ICD-10-CM

## 2021-05-08 MED ORDER — SERTRALINE HCL 50 MG PO TABS
75.0000 mg | ORAL_TABLET | Freq: Every day | ORAL | 0 refills | Status: DC
  Filled 2021-05-08: qty 45, 30d supply, fill #0

## 2021-05-08 NOTE — Telephone Encounter (Signed)
Needs Zoloft  LAST APPOINTMENT DATE: 04/15/21 NEXT APPOINTMENT DATE: 05/13/21   [facility] [address] [place] [address] Phone: [phone] Fax: [phone]  [id number] [place] - [address] AT [facility] [address] [place] [address] Phone: [phone] Fax: [phone]  Patient is requesting a refill of the following medications: No prescriptions requested or ordered in this encounter   Date last filled: 01/28/21 Previously prescribed by Dr Lawson Radar  Lab Results      Component                Value               Date                      HGBA1C                   6.1 (H)             01/20/2021                HGBA1C                   6.5 (H)             06/03/2020           Lab Results      Component                Value               Date                      LDLCALC                  156 (H)             01/20/2021                CREATININE               0.61                01/20/2021           Lab Results      Component                Value               Date                      VD25OH                   26.7 (L)            01/20/2021                VD25OH                   20.8 (L)            06/03/2020            BP Readings from Last 3 Encounters: 04/15/21 : 135/84 03/18/21 : 123/83 02/13/21 : 127/82

## 2021-05-08 NOTE — Telephone Encounter (Signed)
Message sent prev

## 2021-05-13 ENCOUNTER — Ambulatory Visit (INDEPENDENT_AMBULATORY_CARE_PROVIDER_SITE_OTHER): Payer: 59 | Admitting: Family Medicine

## 2021-05-13 ENCOUNTER — Encounter (INDEPENDENT_AMBULATORY_CARE_PROVIDER_SITE_OTHER): Payer: Self-pay | Admitting: Family Medicine

## 2021-05-13 ENCOUNTER — Other Ambulatory Visit: Payer: Self-pay

## 2021-05-13 ENCOUNTER — Other Ambulatory Visit (HOSPITAL_COMMUNITY): Payer: Self-pay

## 2021-05-13 VITALS — BP 121/75 | HR 74 | Temp 98.9°F | Ht 66.0 in | Wt 311.0 lb

## 2021-05-13 DIAGNOSIS — Z6841 Body Mass Index (BMI) 40.0 and over, adult: Secondary | ICD-10-CM

## 2021-05-13 DIAGNOSIS — E559 Vitamin D deficiency, unspecified: Secondary | ICD-10-CM

## 2021-05-13 DIAGNOSIS — F419 Anxiety disorder, unspecified: Secondary | ICD-10-CM | POA: Diagnosis not present

## 2021-05-13 DIAGNOSIS — E1165 Type 2 diabetes mellitus with hyperglycemia: Secondary | ICD-10-CM

## 2021-05-13 DIAGNOSIS — F32A Depression, unspecified: Secondary | ICD-10-CM | POA: Diagnosis not present

## 2021-05-13 MED ORDER — PANTOPRAZOLE SODIUM 20 MG PO TBEC
DELAYED_RELEASE_TABLET | Freq: Every day | ORAL | 0 refills | Status: DC
  Filled 2021-05-13: qty 90, 90d supply, fill #0

## 2021-05-13 MED ORDER — SERTRALINE HCL 100 MG PO TABS
100.0000 mg | ORAL_TABLET | Freq: Every day | ORAL | 0 refills | Status: DC
  Filled 2021-05-13: qty 30, 30d supply, fill #0

## 2021-05-13 MED ORDER — OZEMPIC (0.25 OR 0.5 MG/DOSE) 2 MG/1.5ML ~~LOC~~ SOPN
0.5000 mg | PEN_INJECTOR | SUBCUTANEOUS | 0 refills | Status: DC
  Filled 2021-05-13: qty 1.5, 28d supply, fill #0

## 2021-05-13 MED ORDER — BUSPIRONE HCL 10 MG PO TABS
10.0000 mg | ORAL_TABLET | Freq: Three times a day (TID) | ORAL | 0 refills | Status: DC | PRN
Start: 2021-05-13 — End: 2021-08-12
  Filled 2021-05-13: qty 90, 30d supply, fill #0

## 2021-05-13 MED ORDER — VITAMIN D (ERGOCALCIFEROL) 1.25 MG (50000 UNIT) PO CAPS
ORAL_CAPSULE | ORAL | 0 refills | Status: DC
  Filled 2021-05-13: qty 4, 28d supply, fill #0

## 2021-05-14 NOTE — Progress Notes (Signed)
Chief Complaint:   OBESITY Isabella Guerrero is here to discuss her progress with her obesity treatment plan along with follow-up of her obesity related diagnoses. Isabella Guerrero is on the Category 4 Plan and states she is following her eating plan approximately 55% of the time. Isabella Guerrero states she is walking 2 hours per week.  Today's visit was #: 17 Starting weight: 344 lbs Starting date: 06/03/2021 Today's weight: 311 lbs Today's date: 05/13/2021 Total lbs lost to date: 33 Total lbs lost since last in-office visit: 0  Interim History: Isabella Guerrero hasn't been paying much attention to food choices and options. She is feeling anxiety is getting slightly less controlled. Pt has some financial concerns. She has had 1 episode of binge eating in the last few weeks.  Subjective:   1. Anxiety and depression Isabella Guerrero is on sertraline and Buspar. She reports significant increase in anxiety due to finances and her dad changing chemo.  2. Type 2 diabetes mellitus with hyperglycemia, without long-term current use of insulin (HCC) Pt is on Ozempic 0.25 mg weekly. She has had a slight decline in control of eating on 0.25 mg Ozempic.  3. Vitamin D deficiency Lesleigh denies nausea, vomiting, and muscle weakness but notes fatigue. Pt is on prescription Vit D.  Assessment/Plan:   1. Anxiety and depression Isabella Guerrero will increase sertraline to 100 mg and Buspar to 10 mg TID. Behavior modification techniques were discussed today to help Isabella Guerrero deal with her anxiety.  Orders and follow up as documented in patient record.   Increase and Refill- busPIRone (BUSPAR) 10 MG tablet; Take 1 tablet by mouth 3 times daily as needed.  Dispense: 90 tablet; Refill: 0 Increase and Refill- sertraline (ZOLOFT) 100 MG tablet; Take 1 tablet by mouth daily.  Dispense: 30 tablet; Refill: 0  2. Type 2 diabetes mellitus with hyperglycemia, without long-term current use of insulin (HCC) Isabella Guerrero will increase Ozempic to 0.5 mg. Good blood sugar  control is important to decrease the likelihood of diabetic complications such as nephropathy, neuropathy, limb loss, blindness, coronary artery disease, and death. Intensive lifestyle modification including diet, exercise and weight loss are the first line of treatment for diabetes.   Increase and Refill- Semaglutide,0.25 or 0.5MG /DOS, (OZEMPIC, 0.25 OR 0.5 MG/DOSE,) 2 MG/1.5ML SOPN; Inject 0.5 mg into the skin once a week.  Dispense: 1.5 mL; Refill: 0 Refill- pantoprazole (PROTONIX) 20 MG tablet; TAKE 1 TABLET BY MOUTH DAILY.  Dispense: 90 tablet; Refill: 0  3. Vitamin D deficiency Low Vitamin D level contributes to fatigue and are associated with obesity, breast, and colon cancer. She agrees to continue to take prescription Vitamin D 50,000 IU every week and will follow-up for routine testing of Vitamin D, at least 2-3 times per year to avoid over-replacement.  Refill- Vitamin D, Ergocalciferol, (DRISDOL) 1.25 MG (50000 UNIT) CAPS capsule; TAKE 1 CAPSULE BY MOUTH EVERY 7 DAYS  Dispense: 4 capsule; Refill: 0  4. Obesity with current BMI of 50.3  Isabella Guerrero is currently in the action stage of change. As such, her goal is to continue with weight loss efforts. She has agreed to the Category 4 Plan.   Exercise goals: All adults should avoid inactivity. Some physical activity is better than none, and adults who participate in any amount of physical activity gain some health benefits.  Behavioral modification strategies: increasing lean protein intake, meal planning and cooking strategies, keeping healthy foods in the home, and planning for success.  Isabella Guerrero has agreed to follow-up with our clinic in 3  weeks. She was informed of the importance of frequent follow-up visits to maximize her success with intensive lifestyle modifications for her multiple health conditions.   Objective:   Blood pressure 121/75, pulse 74, temperature 98.9 F (37.2 C), height 5\' 6"  (1.676 m), weight (!) 311 lb (141.1 kg),  SpO2 94 %. Body mass index is 50.2 kg/m.  General: Cooperative, alert, well developed, in no acute distress. HEENT: Conjunctivae and lids unremarkable. Cardiovascular: Regular rhythm.  Lungs: Normal work of breathing. Neurologic: No focal deficits.   Lab Results  Component Value Date   CREATININE 0.61 01/20/2021   BUN 14 01/20/2021   NA 139 01/20/2021   K 4.2 01/20/2021   CL 99 01/20/2021   CO2 26 01/20/2021   Lab Results  Component Value Date   ALT 10 01/20/2021   AST 12 01/20/2021   ALKPHOS 66 01/20/2021   BILITOT <0.2 01/20/2021   Lab Results  Component Value Date   HGBA1C 6.1 (H) 01/20/2021   HGBA1C 6.5 (H) 06/03/2020   Lab Results  Component Value Date   INSULIN 15.5 01/20/2021   INSULIN 18.4 06/03/2020   Lab Results  Component Value Date   TSH 3.000 06/03/2020   Lab Results  Component Value Date   CHOL 221 (H) 01/20/2021   HDL 39 (L) 01/20/2021   LDLCALC 156 (H) 01/20/2021   TRIG 140 01/20/2021   Lab Results  Component Value Date   VD25OH 26.7 (L) 01/20/2021   VD25OH 20.8 (L) 06/03/2020   Lab Results  Component Value Date   WBC 11.7 (H) 06/03/2020   HGB 13.5 06/03/2020   HCT 42.7 06/03/2020   MCV 85 06/03/2020   PLT 334 06/03/2020    Attestation Statements:   Reviewed by clinician on day of visit: allergies, medications, problem list, medical history, surgical history, family history, social history, and previous encounter notes.  06/05/2020, CMA, am acting as transcriptionist for Edmund Hilda, MD.  I have reviewed the above documentation for accuracy and completeness, and I agree with the above. - Reuben Likes, MD

## 2021-06-03 ENCOUNTER — Encounter (INDEPENDENT_AMBULATORY_CARE_PROVIDER_SITE_OTHER): Payer: Self-pay | Admitting: Family Medicine

## 2021-06-03 ENCOUNTER — Other Ambulatory Visit: Payer: Self-pay

## 2021-06-03 ENCOUNTER — Other Ambulatory Visit (HOSPITAL_COMMUNITY): Payer: Self-pay

## 2021-06-03 ENCOUNTER — Ambulatory Visit (INDEPENDENT_AMBULATORY_CARE_PROVIDER_SITE_OTHER): Payer: 59 | Admitting: Family Medicine

## 2021-06-03 VITALS — BP 126/84 | HR 96 | Temp 98.4°F | Ht 66.0 in | Wt 304.0 lb

## 2021-06-03 DIAGNOSIS — F419 Anxiety disorder, unspecified: Secondary | ICD-10-CM

## 2021-06-03 DIAGNOSIS — F32A Depression, unspecified: Secondary | ICD-10-CM | POA: Diagnosis not present

## 2021-06-03 DIAGNOSIS — E1165 Type 2 diabetes mellitus with hyperglycemia: Secondary | ICD-10-CM | POA: Diagnosis not present

## 2021-06-03 DIAGNOSIS — Z6841 Body Mass Index (BMI) 40.0 and over, adult: Secondary | ICD-10-CM | POA: Diagnosis not present

## 2021-06-03 DIAGNOSIS — E559 Vitamin D deficiency, unspecified: Secondary | ICD-10-CM | POA: Diagnosis not present

## 2021-06-03 DIAGNOSIS — Z9189 Other specified personal risk factors, not elsewhere classified: Secondary | ICD-10-CM

## 2021-06-03 MED ORDER — TIRZEPATIDE 2.5 MG/0.5ML ~~LOC~~ SOAJ
2.5000 mg | SUBCUTANEOUS | 0 refills | Status: DC
Start: 2021-06-03 — End: 2021-07-16
  Filled 2021-06-03: qty 2, 28d supply, fill #0

## 2021-06-04 NOTE — Progress Notes (Signed)
Chief Complaint:   OBESITY Isabella Guerrero is here to discuss her progress with her obesity treatment plan along with follow-up of her obesity related diagnoses. Isabella Guerrero is on the Category 4 Plan and states she is following her eating plan approximately 75% of the time. Isabella Guerrero states she is doing lots of walking.  Today's visit was #: 18 Starting weight: 344 lbs Starting date: 06/03/2021 Today's weight: 304 lbs Today's date: 06/03/2021 Total lbs lost to date: 40 Total lbs lost since last in-office visit: 7  Interim History: Isabella Guerrero has been following the plan more strictly than previously. She has noticed an in Ozempic to be helpful with controlling food choices. She has plans for Halloween themed events this weekend. Pt doesn't anticipate candy being an issue.  Subjective:   1. Vitamin D deficiency Pt denies nausea, vomiting, and muscle weakness but notes fatigue. She is on prescription Vit D.  2. Anxiety and depression Isabella Guerrero is doing much better on higher dose of Zoloft and Buspar. Pt denies suicidal or homicidal ideations.  3. Type 2 diabetes mellitus with hyperglycemia, without long-term current use of insulin (HCC) Pt is on Ozempic 0.5 mg with good control but is wondering about switching to [facility].  4. At risk for side effect of medication Isabella Guerrero is at risk for side effects of medication due to starting [facility].  Assessment/Plan:   1. Vitamin D deficiency Low Vitamin D level contributes to fatigue and are associated with obesity, breast, and colon cancer. She agrees to continue to take prescription Vitamin D 50,000 IU every week and will follow-up for routine testing of Vitamin D, at least 2-3 times per year to avoid over-replacement.  2. Anxiety and depression Behavior modification techniques were discussed today to help Isabella Guerrero deal with her anxiety.  Orders and follow up as documented in patient record. Continue current meds with no change in dose.  3. Type 2 diabetes  mellitus with hyperglycemia, without long-term current use of insulin (HCC) Isabella Guerrero will start Mounjaro 2.5 mg as directed. Good blood sugar control is important to decrease the likelihood of diabetic complications such as nephropathy, neuropathy, limb loss, blindness, coronary artery disease, and death. Intensive lifestyle modification including diet, exercise and weight loss are the first line of treatment for diabetes.   Start- tirzepatide Isabella Guerrero) 2.5 MG/0.5ML Pen; Inject 2.5 mg into the skin once a week.  Dispense: 2 mL; Refill: 0  4. At risk for side effect of medication Isabella Guerrero was given approximately 15 minutes of drug side effect counseling today.  We discussed side effect possibility and risk versus benefits. Isabella Guerrero agreed to the medication and will contact this office if these side effects are intolerable.  Repetitive spaced learning was employed today to elicit superior memory formation and behavioral change.   5. Obesity with current BMI of 49.1  Isabella Guerrero is currently in the action stage of change. As such, her goal is to continue with weight loss efforts. She has agreed to the Category 4 Plan.   Exercise goals: All adults should avoid inactivity. Some physical activity is better than none, and adults who participate in any amount of physical activity gain some health benefits.  Behavioral modification strategies: increasing lean protein intake.  Isabella Guerrero has agreed to follow-up with our clinic in 2 weeks. She was informed of the importance of frequent follow-up visits to maximize her success with intensive lifestyle modifications for her multiple health conditions.   Objective:   Blood pressure 126/84, pulse 96, temperature 98.4 F (36.9 C),  height 5\' 6"  (1.676 m), weight (!) 304 lb (137.9 kg), last menstrual period 05/28/2021, SpO2 97 %. Body mass index is 49.07 kg/m.  General: Cooperative, alert, well developed, in no acute distress. HEENT: Conjunctivae and lids  unremarkable. Cardiovascular: Regular rhythm.  Lungs: Normal work of breathing. Neurologic: No focal deficits.   Lab Results  Component Value Date   CREATININE 0.61 01/20/2021   BUN 14 01/20/2021   NA 139 01/20/2021   K 4.2 01/20/2021   CL 99 01/20/2021   CO2 26 01/20/2021   Lab Results  Component Value Date   ALT 10 01/20/2021   AST 12 01/20/2021   ALKPHOS 66 01/20/2021   BILITOT <0.2 01/20/2021   Lab Results  Component Value Date   HGBA1C 6.1 (H) 01/20/2021   HGBA1C 6.5 (H) 06/03/2020   Lab Results  Component Value Date   INSULIN 15.5 01/20/2021   INSULIN 18.4 06/03/2020   Lab Results  Component Value Date   TSH 3.000 06/03/2020   Lab Results  Component Value Date   CHOL 221 (H) 01/20/2021   HDL 39 (L) 01/20/2021   LDLCALC 156 (H) 01/20/2021   TRIG 140 01/20/2021   Lab Results  Component Value Date   VD25OH 26.7 (L) 01/20/2021   VD25OH 20.8 (L) 06/03/2020   Lab Results  Component Value Date   WBC 11.7 (H) 06/03/2020   HGB 13.5 06/03/2020   HCT 42.7 06/03/2020   MCV 85 06/03/2020   PLT 334 06/03/2020    Attestation Statements:   Reviewed by clinician on day of visit: allergies, medications, problem list, medical history, surgical history, family history, social history, and previous encounter notes.  06/05/2020, CMA, am acting as transcriptionist for Edmund Hilda, MD.   I have reviewed the above documentation for accuracy and completeness, and I agree with the above. - Reuben Likes, MD

## 2021-06-06 ENCOUNTER — Other Ambulatory Visit (HOSPITAL_COMMUNITY): Payer: Self-pay

## 2021-06-17 ENCOUNTER — Other Ambulatory Visit: Payer: Self-pay

## 2021-06-17 ENCOUNTER — Encounter (INDEPENDENT_AMBULATORY_CARE_PROVIDER_SITE_OTHER): Payer: Self-pay | Admitting: Family Medicine

## 2021-06-17 ENCOUNTER — Ambulatory Visit (INDEPENDENT_AMBULATORY_CARE_PROVIDER_SITE_OTHER): Payer: 59 | Admitting: Family Medicine

## 2021-06-17 ENCOUNTER — Other Ambulatory Visit (HOSPITAL_COMMUNITY): Payer: Self-pay

## 2021-06-17 VITALS — BP 127/83 | HR 96 | Temp 98.8°F | Ht 66.0 in | Wt 304.0 lb

## 2021-06-17 DIAGNOSIS — F419 Anxiety disorder, unspecified: Secondary | ICD-10-CM | POA: Diagnosis not present

## 2021-06-17 DIAGNOSIS — F32A Depression, unspecified: Secondary | ICD-10-CM

## 2021-06-17 DIAGNOSIS — Z6841 Body Mass Index (BMI) 40.0 and over, adult: Secondary | ICD-10-CM | POA: Diagnosis not present

## 2021-06-17 DIAGNOSIS — E559 Vitamin D deficiency, unspecified: Secondary | ICD-10-CM | POA: Diagnosis not present

## 2021-06-17 MED ORDER — VITAMIN D (ERGOCALCIFEROL) 1.25 MG (50000 UNIT) PO CAPS
ORAL_CAPSULE | ORAL | 0 refills | Status: DC
Start: 2021-06-17 — End: 2021-07-16
  Filled 2021-06-17: qty 4, 28d supply, fill #0

## 2021-06-17 MED ORDER — SERTRALINE HCL 100 MG PO TABS
100.0000 mg | ORAL_TABLET | Freq: Every day | ORAL | 0 refills | Status: DC
Start: 2021-06-17 — End: 2021-08-12
  Filled 2021-06-17: qty 90, 90d supply, fill #0

## 2021-06-17 NOTE — Progress Notes (Signed)
Chief Complaint:   OBESITY Isabella Guerrero is here to discuss her progress with her obesity treatment plan along with follow-up of her obesity related diagnoses. Isabella Guerrero is on the Category 4 Plan and states she is following her eating plan approximately 75-80% of the time. Isabella Guerrero states she is walking 60 minutes 3-4 times per week.  Today's visit was #: 33 Starting weight: 344 lbs Starting date: 06/03/2021 Today's weight: 304 lbs Today's date: 06/17/2021 Total lbs lost to date: 40 Total lbs lost since last in-office visit: 0  Interim History: Isabella Guerrero a good Halloween with her daughter. She is hosting Thanksgiving this year but is not cooking. Over the last few weeks, work has been more busy and pt hasn't been able to eat as much. She is substituting sometimes entire meal for protein shake. There are some days she skips entire meals. Pt has not started [facility] yet.  Subjective:   1. Vitamin D deficiency Pt denies nausea, vomiting, and muscle weakness but notes fatigue. Her last Vit D level was 26.7.  2. Anxiety and depression Pt denies suicidal or homicidal ideations. Mirel is on Zoloft 123XX123 mg PO daily.  Assessment/Plan:   1. Vitamin D deficiency Low Vitamin D level contributes to fatigue and are associated with obesity, breast, and colon cancer. She agrees to continue to take prescription Vitamin D 50,000 IU every week and will follow-up for routine testing of Vitamin D, at least 2-3 times per year to avoid over-replacement.  Refill- Vitamin D, Ergocalciferol, (DRISDOL) 1.25 MG (50000 UNIT) CAPS capsule; TAKE 1 CAPSULE BY MOUTH EVERY 7 DAYS  Dispense: 4 capsule; Refill: 0  2. Anxiety and depression Behavior modification techniques were discussed today to help Isabella Guerrero deal with her anxiety.  Orders and follow up as documented in patient record.   Refill- sertraline (ZOLOFT) 100 MG tablet; Take 1 tablet by mouth daily.  Dispense: 90 tablet; Refill: 0  3. Obesity with current BMI of  123456  Rocky is currently in the action stage of change. As such, her goal is to continue with weight loss efforts. She has agreed to the Category 4 Plan.   Exercise goals:  As is  Behavioral modification strategies: increasing lean protein intake, no skipping meals, meal planning and cooking strategies, and keeping healthy foods in the home.  Isabella Guerrero has agreed to follow-up with our clinic in 3 weeks. She was informed of the importance of frequent follow-up visits to maximize her success with intensive lifestyle modifications for her multiple health conditions.   Objective:   Blood pressure 127/83, pulse 96, temperature 98.8 F (37.1 C), height 5\' 6"  (1.676 m), weight (!) 304 lb (137.9 kg), last menstrual period 05/28/2021, SpO2 97 %. Body mass index is 49.07 kg/m.  General: Cooperative, alert, well developed, in no acute distress. HEENT: Conjunctivae and lids unremarkable. Cardiovascular: Regular rhythm.  Lungs: Normal work of breathing. Neurologic: No focal deficits.   Lab Results  Component Value Date   CREATININE 0.61 01/20/2021   BUN 14 01/20/2021   NA 139 01/20/2021   K 4.2 01/20/2021   CL 99 01/20/2021   CO2 26 01/20/2021   Lab Results  Component Value Date   ALT 10 01/20/2021   AST 12 01/20/2021   ALKPHOS 66 01/20/2021   BILITOT <0.2 01/20/2021   Lab Results  Component Value Date   HGBA1C 6.1 (H) 01/20/2021   HGBA1C 6.5 (H) 06/03/2020   Lab Results  Component Value Date   INSULIN 15.5 01/20/2021   INSULIN  18.4 06/03/2020   Lab Results  Component Value Date   TSH 3.000 06/03/2020   Lab Results  Component Value Date   CHOL 221 (H) 01/20/2021   HDL 39 (L) 01/20/2021   LDLCALC 156 (H) 01/20/2021   TRIG 140 01/20/2021   Lab Results  Component Value Date   VD25OH 26.7 (L) 01/20/2021   VD25OH 20.8 (L) 06/03/2020   Lab Results  Component Value Date   WBC 11.7 (H) 06/03/2020   HGB 13.5 06/03/2020   HCT 42.7 06/03/2020   MCV 85 06/03/2020   PLT 334  06/03/2020    Attestation Statements:   Reviewed by clinician on day of visit: allergies, medications, problem list, medical history, surgical history, family history, social history, and previous encounter notes.  Edmund Hilda, CMA, am acting as transcriptionist for Reuben Likes, MD.   I have reviewed the above documentation for accuracy and completeness, and I agree with the above. - Reuben Likes, MD

## 2021-07-16 ENCOUNTER — Ambulatory Visit (INDEPENDENT_AMBULATORY_CARE_PROVIDER_SITE_OTHER): Payer: 59 | Admitting: Family Medicine

## 2021-07-16 ENCOUNTER — Other Ambulatory Visit (HOSPITAL_COMMUNITY): Payer: Self-pay

## 2021-07-16 ENCOUNTER — Encounter (INDEPENDENT_AMBULATORY_CARE_PROVIDER_SITE_OTHER): Payer: Self-pay | Admitting: Family Medicine

## 2021-07-16 ENCOUNTER — Other Ambulatory Visit: Payer: Self-pay

## 2021-07-16 VITALS — BP 133/83 | HR 84 | Temp 98.2°F | Ht 66.0 in | Wt 301.0 lb

## 2021-07-16 DIAGNOSIS — E1165 Type 2 diabetes mellitus with hyperglycemia: Secondary | ICD-10-CM | POA: Diagnosis not present

## 2021-07-16 DIAGNOSIS — E559 Vitamin D deficiency, unspecified: Secondary | ICD-10-CM

## 2021-07-16 DIAGNOSIS — Z6841 Body Mass Index (BMI) 40.0 and over, adult: Secondary | ICD-10-CM | POA: Diagnosis not present

## 2021-07-16 MED ORDER — OZEMPIC (0.25 OR 0.5 MG/DOSE) 2 MG/1.5ML ~~LOC~~ SOPN
0.5000 mg | PEN_INJECTOR | SUBCUTANEOUS | 0 refills | Status: DC
Start: 2021-07-16 — End: 2021-08-12
  Filled 2021-07-16: qty 1.5, 28d supply, fill #0

## 2021-07-16 MED ORDER — VITAMIN D (ERGOCALCIFEROL) 1.25 MG (50000 UNIT) PO CAPS
ORAL_CAPSULE | ORAL | 0 refills | Status: DC
Start: 2021-07-16 — End: 2021-08-12
  Filled 2021-07-16: qty 4, 28d supply, fill #0

## 2021-07-16 NOTE — Progress Notes (Signed)
Chief Complaint:   OBESITY Isabella Guerrero is here to discuss her progress with her obesity treatment plan along with follow-up of her obesity related diagnoses. Isabella Guerrero is on the Category 4 Plan and states she is following her eating plan approximately 30% of the time. Isabella Guerrero states she is not currently exercising.  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 Her eating has been limited- eating only once a day due to time and fatigue. She is drinking protein shakes on the way to work or on the way home.while at work, she's been eating sandwiches or salads. The next few weeks look similar.  Subjective:   1. Vitamin D deficiency Pt denies nausea, vomiting, and muscle weakness but notes fatigue. She is on prescription Vit D. Her last Vit D level was 26.7.  2. Type 2 diabetes mellitus with hyperglycemia, without long-term current use of insulin (Isabella Guerrero) Isabella Guerrero is on Mounjaro 2.5 mg but is noticing a significant increase in cravings for sweets. Her last A1c was 6.1 and insulin level 15.5.  Assessment/Plan:   1. Vitamin D deficiency Low Vitamin D level contributes to fatigue and are associated with obesity, breast, and colon cancer. She agrees to continue to take prescription Vitamin D 50,000 IU every week and will follow-up for routine testing of Vitamin D, at least 2-3 times per year to avoid over-replacement.  Refill- Vitamin D, Ergocalciferol, (DRISDOL) 1.25 MG (50000 UNIT) CAPS capsule; TAKE 1 CAPSULE BY MOUTH EVERY 7 DAYS  Dispense: 4 capsule; Refill: 0  2. Type 2 diabetes mellitus with hyperglycemia, without long-term current use of insulin (HCC) Good blood sugar control is important to decrease the likelihood of diabetic complications such as nephropathy,  neuropathy, limb loss, blindness, coronary artery disease, and death. Intensive lifestyle modification including diet, exercise and weight loss are the first line of treatment for diabetes. Discontinue Mounjaro and start Ozempic 0.5 mg weekly.  Start- Semaglutide,0.25 or 0.5MG /DOS, (OZEMPIC, 0.25 OR 0.5 MG/DOSE,) 2 MG/1.5ML SOPN; Inject 0.5 mg into the skin once a week.  Dispense: 1.5 mL; Refill: 0  3. Obesity with current BMI of Q000111Q  Isabella Guerrero is currently in the action stage of change. As such, her goal is to continue with weight loss efforts. She has agreed to the Category 4 Plan.   Exercise goals:  Pt is to start physical activity in the new year.  Behavioral modification strategies: increasing lean protein intake, meal planning and cooking strategies, keeping healthy foods in the home, and holiday eating strategies .  Isabella Guerrero has agreed to follow-up with our clinic in 3 weeks. She was informed of the importance of frequent follow-up visits to maximize her success with intensive lifestyle modifications for her multiple health conditions.   Objective:   Blood pressure 133/83, pulse 84, temperature 98.2 F (36.8 C), height 5\' 6"  (1.676 m), weight (!) 301 lb (136.5 kg), last menstrual period 07/02/2021, SpO2 97 %. Body mass index is 48.58 kg/m.  General: Cooperative, alert, well developed, in no acute distress. HEENT: Conjunctivae and lids unremarkable. Cardiovascular: Regular rhythm.  Lungs: Normal work of breathing. Neurologic: No focal deficits.   Lab Results  Component Value Date   CREATININE 0.61 01/20/2021   BUN 14 01/20/2021   NA 139 01/20/2021   K 4.2 01/20/2021  CL 99 01/20/2021   CO2 26 01/20/2021   Lab Results  Component Value Date   ALT 10 01/20/2021   AST 12 01/20/2021   ALKPHOS 66 01/20/2021   BILITOT <0.2 01/20/2021   Lab Results  Component Value Date   HGBA1C 6.1 (H) 01/20/2021   HGBA1C 6.5 (H) 06/03/2020   Lab Results  Component Value Date   INSULIN  15.5 01/20/2021   INSULIN 18.4 06/03/2020   Lab Results  Component Value Date   TSH 3.000 06/03/2020   Lab Results  Component Value Date   CHOL 221 (H) 01/20/2021   HDL 39 (L) 01/20/2021   LDLCALC 156 (H) 01/20/2021   TRIG 140 01/20/2021   Lab Results  Component Value Date   VD25OH 26.7 (L) 01/20/2021   VD25OH 20.8 (L) 06/03/2020   Lab Results  Component Value Date   WBC 11.7 (H) 06/03/2020   HGB 13.5 06/03/2020   HCT 42.7 06/03/2020   MCV 85 06/03/2020   PLT 334 06/03/2020    Attestation Statements:   Reviewed by clinician on day of visit: allergies, medications, problem list, medical history, surgical history, family history, social history, and previous encounter notes.  Edmund Hilda, CMA, am acting as transcriptionist for Reuben Likes, MD.  I have reviewed the above documentation for accuracy and completeness, and I agree with the above. - Reuben Likes, MD

## 2021-08-12 ENCOUNTER — Other Ambulatory Visit: Payer: Self-pay

## 2021-08-12 ENCOUNTER — Encounter (INDEPENDENT_AMBULATORY_CARE_PROVIDER_SITE_OTHER): Payer: Self-pay | Admitting: Family Medicine

## 2021-08-12 ENCOUNTER — Other Ambulatory Visit (HOSPITAL_COMMUNITY): Payer: Self-pay

## 2021-08-12 ENCOUNTER — Ambulatory Visit (INDEPENDENT_AMBULATORY_CARE_PROVIDER_SITE_OTHER): Payer: 59 | Admitting: Family Medicine

## 2021-08-12 VITALS — BP 130/81 | HR 86 | Temp 98.4°F | Ht 66.0 in | Wt 306.0 lb

## 2021-08-12 DIAGNOSIS — E1165 Type 2 diabetes mellitus with hyperglycemia: Secondary | ICD-10-CM | POA: Diagnosis not present

## 2021-08-12 DIAGNOSIS — F419 Anxiety disorder, unspecified: Secondary | ICD-10-CM | POA: Diagnosis not present

## 2021-08-12 DIAGNOSIS — F32A Depression, unspecified: Secondary | ICD-10-CM | POA: Diagnosis not present

## 2021-08-12 DIAGNOSIS — E559 Vitamin D deficiency, unspecified: Secondary | ICD-10-CM | POA: Diagnosis not present

## 2021-08-12 DIAGNOSIS — Z6841 Body Mass Index (BMI) 40.0 and over, adult: Secondary | ICD-10-CM | POA: Diagnosis not present

## 2021-08-12 DIAGNOSIS — I10 Essential (primary) hypertension: Secondary | ICD-10-CM | POA: Diagnosis not present

## 2021-08-12 MED ORDER — BUSPIRONE HCL 10 MG PO TABS
10.0000 mg | ORAL_TABLET | Freq: Three times a day (TID) | ORAL | 0 refills | Status: DC | PRN
  Filled 2021-08-12: qty 90, 30d supply, fill #0

## 2021-08-12 MED ORDER — METOPROLOL SUCCINATE ER 25 MG PO TB24
ORAL_TABLET | Freq: Every day | ORAL | 1 refills | Status: DC
  Filled 2021-08-12: qty 90, 90d supply, fill #0

## 2021-08-12 MED ORDER — PANTOPRAZOLE SODIUM 20 MG PO TBEC
DELAYED_RELEASE_TABLET | Freq: Every day | ORAL | 0 refills | Status: DC
  Filled 2021-08-12: qty 90, 90d supply, fill #0

## 2021-08-12 MED ORDER — VITAMIN D (ERGOCALCIFEROL) 1.25 MG (50000 UNIT) PO CAPS
ORAL_CAPSULE | ORAL | 0 refills | Status: DC
  Filled 2021-08-12: qty 4, 28d supply, fill #0

## 2021-08-12 MED ORDER — SEMAGLUTIDE (1 MG/DOSE) 4 MG/3ML ~~LOC~~ SOPN
1.0000 mg | PEN_INJECTOR | SUBCUTANEOUS | 0 refills | Status: DC
  Filled 2021-08-12: qty 3, 28d supply, fill #0

## 2021-08-12 MED ORDER — SERTRALINE HCL 100 MG PO TABS
100.0000 mg | ORAL_TABLET | Freq: Every day | ORAL | 0 refills | Status: DC
  Filled 2021-08-12 – 2021-10-10 (×2): qty 90, 90d supply, fill #0

## 2021-08-12 NOTE — Progress Notes (Signed)
Chief Complaint:   OBESITY Isabella Guerrero is here to discuss her progress with her obesity treatment plan along with follow-up of her obesity related diagnoses. Isabella Guerrero is on the Category 4 Plan and states she is following her eating plan approximately 30-40% of the time. Isabella Guerrero states she is not currently exercising.  Today's visit was #: 21 Starting weight: 344 lbs Starting date: 06/03/2021 Today's weight: 306 lbs Today's date: 08/12/2021 Total lbs lost to date: 38 Total lbs lost since last in-office visit: 0  Interim History: Pt had a great Christmas- realizes she fell into old habits in terms of eating. She got kettlebells for Christmas. She worked [employer]. Pt wants to get back on track after the holidays. She wants to start doing some kettlebell exercises. She has all food in the house for meal plan.  Subjective:   1. Anxiety and depression Isabella Guerrero is on Buspar and Zoloft. She reports doing better on meds.  2. Vitamin D deficiency Pt denies nausea, vomiting, and muscle weakness but notes fatigue. She is on prescription Vit D.  3. Essential hypertension BP controlled today. Pt is on metoprolol 25 mg.  4. Type 2 diabetes mellitus with hyperglycemia, without long-term current use of insulin (Isabella Guerrero) She is doing well on Ozempic 0.5 mg. Pt reports some occasional reflux but controlled with Protonix.  Assessment/Plan:   1. Anxiety and depression Behavior modification techniques were discussed today to help Kaisey deal with her anxiety.  Orders and follow up as documented in patient record.   Refill- busPIRone (BUSPAR) 10 MG tablet; Take 1 tablet by mouth 3 times daily as needed.  Dispense: 90 tablet; Refill: 0 Refill- sertraline (ZOLOFT) 100 MG tablet; Take 1 tablet by mouth daily.  Dispense: 90 tablet; Refill: 0  2. Vitamin D deficiency Low Vitamin D level contributes to fatigue and are associated with obesity, breast, and colon cancer. She agrees to continue to take prescription  Vitamin D 50,000 IU every week and will follow-up for routine testing of Vitamin D, at least 2-3 times per year to avoid over-replacement.  Refill- Vitamin D, Ergocalciferol, (DRISDOL) 1.25 MG (50000 UNIT) CAPS capsule; TAKE 1 CAPSULE BY MOUTH EVERY 7 DAYS  Dispense: 4 capsule; Refill: 0  3. Essential hypertension Isabella Guerrero is working on healthy weight loss and exercise to improve blood pressure control. We will watch for signs of hypotension as she continues her lifestyle modifications.  Refill- metoprolol succinate (TOPROL-XL) 25 MG 24 hr tablet; TAKE 1 TABLET BY MOUTH ONCE A DAY  Dispense: 90 tablet; Refill: 1  4. Type 2 diabetes mellitus with hyperglycemia, without long-term current use of insulin (HCC) Good blood sugar control is important to decrease the likelihood of diabetic complications such as nephropathy, neuropathy, limb loss, blindness, coronary artery disease, and death. Intensive lifestyle modification including diet, exercise and weight loss are the first line of treatment for diabetes. Increase Ozempic to 1 mg as directed.  Increase and Refill- Semaglutide, 1 MG/DOSE, 4 MG/3ML SOPN; Inject 1 mg as directed once a week.  Dispense: 3 mL; Refill: 0 Refill- pantoprazole (PROTONIX) 20 MG tablet; TAKE 1 TABLET BY MOUTH DAILY.  Dispense: 90 tablet; Refill: 0  5. Obesity with current BMI of Q000111Q  Isabella Guerrero is currently in the action stage of change. As such, her goal is to continue with weight loss efforts. She has agreed to the Category 4 Plan.   Exercise goals: All adults should avoid inactivity. Some physical activity is better than none, and adults who participate  in any amount of physical activity gain some health benefits.  Behavioral modification strategies: increasing lean protein intake, meal planning and cooking strategies, keeping healthy foods in the home, and planning for success.  Isabella Guerrero has agreed to follow-up with our clinic in 3 weeks. She was informed of the importance of  frequent follow-up visits to maximize her success with intensive lifestyle modifications for her multiple health conditions.   Objective:   Blood pressure 130/81, pulse 86, temperature 98.4 F (36.9 C), height 5\' 6"  (1.676 m), weight (!) 306 lb (138.8 kg), SpO2 98 %. Body mass index is 49.39 kg/m.  General: Cooperative, alert, well developed, in no acute distress. HEENT: Conjunctivae and lids unremarkable. Cardiovascular: Regular rhythm.  Lungs: Normal work of breathing. Neurologic: No focal deficits.   Lab Results  Component Value Date   CREATININE 0.61 01/20/2021   BUN 14 01/20/2021   NA 139 01/20/2021   K 4.2 01/20/2021   CL 99 01/20/2021   CO2 26 01/20/2021   Lab Results  Component Value Date   ALT 10 01/20/2021   AST 12 01/20/2021   ALKPHOS 66 01/20/2021   BILITOT <0.2 01/20/2021   Lab Results  Component Value Date   HGBA1C 6.1 (H) 01/20/2021   HGBA1C 6.5 (H) 06/03/2020   Lab Results  Component Value Date   INSULIN 15.5 01/20/2021   INSULIN 18.4 06/03/2020   Lab Results  Component Value Date   TSH 3.000 06/03/2020   Lab Results  Component Value Date   CHOL 221 (H) 01/20/2021   HDL 39 (L) 01/20/2021   LDLCALC 156 (H) 01/20/2021   TRIG 140 01/20/2021   Lab Results  Component Value Date   VD25OH 26.7 (L) 01/20/2021   VD25OH 20.8 (L) 06/03/2020   Lab Results  Component Value Date   WBC 11.7 (H) 06/03/2020   HGB 13.5 06/03/2020   HCT 42.7 06/03/2020   MCV 85 06/03/2020   PLT 334 06/03/2020    Attestation Statements:   Reviewed by clinician on day of visit: allergies, medications, problem list, medical history, surgical history, family history, social history, and previous encounter notes.  Coral Ceo, CMA, am acting as transcriptionist for Coralie Common, MD.   I have reviewed the above documentation for accuracy and completeness, and I agree with the above. - Coralie Common, MD

## 2021-08-19 DIAGNOSIS — N644 Mastodynia: Secondary | ICD-10-CM | POA: Diagnosis not present

## 2021-09-09 ENCOUNTER — Other Ambulatory Visit: Payer: Self-pay

## 2021-09-09 ENCOUNTER — Encounter (INDEPENDENT_AMBULATORY_CARE_PROVIDER_SITE_OTHER): Payer: Self-pay | Admitting: Family Medicine

## 2021-09-09 ENCOUNTER — Ambulatory Visit (INDEPENDENT_AMBULATORY_CARE_PROVIDER_SITE_OTHER): Payer: 59 | Admitting: Family Medicine

## 2021-09-09 VITALS — BP 115/76 | HR 88 | Temp 98.4°F | Ht 66.0 in | Wt 298.0 lb

## 2021-09-09 DIAGNOSIS — E669 Obesity, unspecified: Secondary | ICD-10-CM

## 2021-09-09 DIAGNOSIS — E559 Vitamin D deficiency, unspecified: Secondary | ICD-10-CM | POA: Diagnosis not present

## 2021-09-09 DIAGNOSIS — E1165 Type 2 diabetes mellitus with hyperglycemia: Secondary | ICD-10-CM

## 2021-09-09 DIAGNOSIS — E785 Hyperlipidemia, unspecified: Secondary | ICD-10-CM | POA: Diagnosis not present

## 2021-09-09 DIAGNOSIS — F17210 Nicotine dependence, cigarettes, uncomplicated: Secondary | ICD-10-CM | POA: Diagnosis not present

## 2021-09-09 DIAGNOSIS — Z6841 Body Mass Index (BMI) 40.0 and over, adult: Secondary | ICD-10-CM

## 2021-09-09 DIAGNOSIS — E1169 Type 2 diabetes mellitus with other specified complication: Secondary | ICD-10-CM

## 2021-09-09 DIAGNOSIS — I152 Hypertension secondary to endocrine disorders: Secondary | ICD-10-CM | POA: Diagnosis not present

## 2021-09-09 DIAGNOSIS — Z7985 Long-term (current) use of injectable non-insulin antidiabetic drugs: Secondary | ICD-10-CM

## 2021-09-09 NOTE — Progress Notes (Signed)
Chief Complaint:   OBESITY Isabella Guerrero is here to discuss her progress with her obesity treatment plan along with follow-up of her obesity related diagnoses. Isabella Guerrero is on the Category 4 Plan and states she is following her eating plan approximately 75% of the time. Isabella Guerrero states she is walking and kettlebells 20-60 minutes 3-7 times per week.  Today's visit was #: 22 Starting weight: 344 lbs Starting date: 06/03/2021 Today's weight: 298 lbs Today's date: 09/09/2021 Total lbs lost to date: 46 Total lbs lost since last in-office visit: 8  Interim History: Since her last appt, pt has recommitted to category 4. She is working on getting all food in. Pt will supplement with protein shakes. She does string cheese, nuts, beef jerky for snacks. Pt reports cravings for sweets. The next few weeks, pt has working and life activities. She has started looking for other jobs.  Subjective:   1. Type 2 diabetes mellitus with hyperglycemia, without long-term current use of insulin (HCC) Pt is doing well on Ozempic with no GI side effects.  2. Hyperlipidemia associated with type 2 diabetes mellitus (HCC) Pt has an LDL of 156, HDL 39, and triglycerides 975. She is not on statin therapy.  3. Vitamin D deficiency Pt is on Rx Vit D and her last level was 26.7.  4. Hypertension due to endocrine disorder BP well controlled today. Pt denies chest pain/chest pressure/headache.  Assessment/Plan:   1. Type 2 diabetes mellitus with hyperglycemia, without long-term current use of insulin (HCC) Good blood sugar control is important to decrease the likelihood of diabetic complications such as nephropathy, neuropathy, limb loss, blindness, coronary artery disease, and death. Intensive lifestyle modification including diet, exercise and weight loss are the first line of treatment for diabetes. Check labs today.  Refill Ozempic 1 mg weekly.  - Hemoglobin A1c - Insulin, random  2. Hyperlipidemia associated with  type 2 diabetes mellitus (HCC) Cardiovascular risk and specific lipid/LDL goals reviewed.  We discussed several lifestyle modifications today and Isabella Guerrero will continue to work on diet, exercise and weight loss efforts. Orders and follow up as documented in patient record.   Counseling Intensive lifestyle modifications are the first line treatment for this issue. Dietary changes: Increase soluble fiber. Decrease simple carbohydrates. Exercise changes: Moderate to vigorous-intensity aerobic activity 150 minutes per week if tolerated. Lipid-lowering medications: see documented in medical record. Check labs today.  - Lipid Panel With LDL/HDL Ratio  3. Vitamin D deficiency Low Vitamin D level contributes to fatigue and are associated with obesity, breast, and colon cancer. She agrees to continue to take prescription Vitamin D @50 ,000 IU every week and will follow-up for routine testing of Vitamin D, at least 2-3 times per year to avoid over-replacement. Check labs today.  Refill Vit D 50K IU weekly.  - VITAMIN D 25 Hydroxy (Vit-D Deficiency, Fractures)  4. Hypertension due to endocrine disorder Isabella Guerrero is working on healthy weight loss and exercise to improve blood pressure control. We will watch for signs of hypotension as she continues her lifestyle modifications. Check labs today.  - Comprehensive metabolic panel  5. Obesity with current BMI of 48.2 Isabella Guerrero is currently in the action stage of change. As such, her goal is to continue with weight loss efforts. She has agreed to the Category 4 Plan.   Exercise goals:  As is  Behavioral modification strategies: increasing lean protein intake, meal planning and cooking strategies, and keeping healthy foods in the home.  Isabella Guerrero has agreed to follow-up with  our clinic in 3 weeks. She was informed of the importance of frequent follow-up visits to maximize her success with intensive lifestyle modifications for her multiple health conditions.    Isabella Guerrero was informed we would discuss her lab results at her next visit unless there is a critical issue that needs to be addressed sooner. Isabella Guerrero agreed to keep her next visit at the agreed upon time to discuss these results.  Objective:   Blood pressure 115/76, pulse 88, temperature 98.4 F (36.9 C), height 5\' 6"  (1.676 m), weight 298 lb (135.2 kg), SpO2 97 %. Body mass index is 48.1 kg/m.  General: Cooperative, alert, well developed, in no acute distress. HEENT: Conjunctivae and lids unremarkable. Cardiovascular: Regular rhythm.  Lungs: Normal work of breathing. Neurologic: No focal deficits.   Lab Results  Component Value Date   CREATININE 0.61 01/20/2021   BUN 14 01/20/2021   NA 139 01/20/2021   K 4.2 01/20/2021   CL 99 01/20/2021   CO2 26 01/20/2021   Lab Results  Component Value Date   ALT 10 01/20/2021   AST 12 01/20/2021   ALKPHOS 66 01/20/2021   BILITOT <0.2 01/20/2021   Lab Results  Component Value Date   HGBA1C 6.1 (H) 01/20/2021   HGBA1C 6.5 (H) 06/03/2020   Lab Results  Component Value Date   INSULIN 15.5 01/20/2021   INSULIN 18.4 06/03/2020   Lab Results  Component Value Date   TSH 3.000 06/03/2020   Lab Results  Component Value Date   CHOL 221 (H) 01/20/2021   HDL 39 (L) 01/20/2021   LDLCALC 156 (H) 01/20/2021   TRIG 140 01/20/2021   Lab Results  Component Value Date   VD25OH 26.7 (L) 01/20/2021   VD25OH 20.8 (L) 06/03/2020   Lab Results  Component Value Date   WBC 11.7 (H) 06/03/2020   HGB 13.5 06/03/2020   HCT 42.7 06/03/2020   MCV 85 06/03/2020   PLT 334 06/03/2020   Attestation Statements:   Reviewed by clinician on day of visit: allergies, medications, problem list, medical history, surgical history, family history, social history, and previous encounter notes.  Coral Ceo, CMA, am acting as transcriptionist for Coralie Common, MD.   I have reviewed the above documentation for accuracy and completeness, and I  agree with the above. - Coralie Common, MD

## 2021-09-10 ENCOUNTER — Other Ambulatory Visit (HOSPITAL_COMMUNITY): Payer: Self-pay

## 2021-09-10 LAB — COMPREHENSIVE METABOLIC PANEL
ALT: 10 IU/L (ref 0–32)
AST: 13 IU/L (ref 0–40)
Albumin/Globulin Ratio: 1.7 (ref 1.2–2.2)
Albumin: 4.3 g/dL (ref 3.8–4.8)
Alkaline Phosphatase: 71 IU/L (ref 44–121)
BUN/Creatinine Ratio: 18 (ref 9–23)
BUN: 10 mg/dL (ref 6–20)
Bilirubin Total: <0.2 mg/dL (ref 0.0–1.2)
CO2: 26 mmol/L (ref 20–29)
Calcium: 9.3 mg/dL (ref 8.7–10.2)
Chloride: 101 mmol/L (ref 96–106)
Creatinine, Ser: 0.55 mg/dL — ABNORMAL LOW (ref 0.57–1.00)
Globulin, Total: 2.5 g/dL (ref 1.5–4.5)
Glucose: 82 mg/dL (ref 70–99)
Potassium: 4.6 mmol/L (ref 3.5–5.2)
Sodium: 143 mmol/L (ref 134–144)
Total Protein: 6.8 g/dL (ref 6.0–8.5)
eGFR: 125 mL/min/{1.73_m2} (ref >59)

## 2021-09-10 LAB — HEMOGLOBIN A1C
Est. average glucose Bld gHb Est-mCnc: 123 mg/dL
Hgb A1c MFr Bld: 5.9 % — ABNORMAL HIGH (ref 4.8–5.6)

## 2021-09-10 LAB — LIPID PANEL WITH LDL/HDL RATIO
Cholesterol, Total: 222 mg/dL — ABNORMAL HIGH (ref 100–199)
HDL: 35 mg/dL — ABNORMAL LOW (ref >39)
LDL Chol Calc (NIH): 163 mg/dL — ABNORMAL HIGH (ref 0–99)
LDL/HDL Ratio: 4.7 ratio — ABNORMAL HIGH (ref 0.0–3.2)
Triglycerides: 131 mg/dL (ref 0–149)
VLDL Cholesterol Cal: 24 mg/dL (ref 5–40)

## 2021-09-10 LAB — INSULIN, RANDOM: INSULIN: 11.5 u[IU]/mL (ref 2.6–24.9)

## 2021-09-10 LAB — VITAMIN D 25 HYDROXY (VIT D DEFICIENCY, FRACTURES): Vit D, 25-Hydroxy: 29.4 ng/mL — ABNORMAL LOW (ref 30.0–100.0)

## 2021-09-10 MED ORDER — VITAMIN D (ERGOCALCIFEROL) 1.25 MG (50000 UNIT) PO CAPS
ORAL_CAPSULE | ORAL | 0 refills | Status: DC
  Filled 2021-09-10: qty 4, 28d supply, fill #0

## 2021-09-10 MED ORDER — SEMAGLUTIDE (1 MG/DOSE) 4 MG/3ML ~~LOC~~ SOPN
1.0000 mg | PEN_INJECTOR | SUBCUTANEOUS | 0 refills | Status: DC
  Filled 2021-09-10: qty 3, 28d supply, fill #0

## 2021-09-30 ENCOUNTER — Other Ambulatory Visit: Payer: Self-pay

## 2021-09-30 ENCOUNTER — Other Ambulatory Visit (HOSPITAL_COMMUNITY): Payer: Self-pay

## 2021-09-30 ENCOUNTER — Encounter (INDEPENDENT_AMBULATORY_CARE_PROVIDER_SITE_OTHER): Payer: Self-pay | Admitting: Family Medicine

## 2021-09-30 ENCOUNTER — Ambulatory Visit (INDEPENDENT_AMBULATORY_CARE_PROVIDER_SITE_OTHER): Payer: 59 | Admitting: Family Medicine

## 2021-09-30 VITALS — BP 117/78 | HR 90 | Temp 98.5°F | Ht 66.0 in | Wt 298.0 lb

## 2021-09-30 DIAGNOSIS — E559 Vitamin D deficiency, unspecified: Secondary | ICD-10-CM | POA: Diagnosis not present

## 2021-09-30 DIAGNOSIS — E669 Obesity, unspecified: Secondary | ICD-10-CM | POA: Diagnosis not present

## 2021-09-30 DIAGNOSIS — Z6841 Body Mass Index (BMI) 40.0 and over, adult: Secondary | ICD-10-CM

## 2021-09-30 DIAGNOSIS — Z9189 Other specified personal risk factors, not elsewhere classified: Secondary | ICD-10-CM | POA: Diagnosis not present

## 2021-09-30 DIAGNOSIS — E1165 Type 2 diabetes mellitus with hyperglycemia: Secondary | ICD-10-CM

## 2021-09-30 MED ORDER — VITAMIN D (ERGOCALCIFEROL) 1.25 MG (50000 UNIT) PO CAPS
ORAL_CAPSULE | ORAL | 0 refills | Status: DC
  Filled 2021-09-30: qty 4, fill #0

## 2021-09-30 MED ORDER — SEMAGLUTIDE (1 MG/DOSE) 4 MG/3ML ~~LOC~~ SOPN
1.0000 mg | PEN_INJECTOR | SUBCUTANEOUS | 0 refills | Status: DC
  Filled 2021-09-30: qty 3, 28d supply, fill #0

## 2021-09-30 NOTE — Progress Notes (Signed)
Chief Complaint:   OBESITY Isabella Guerrero is here to discuss her progress with her obesity treatment plan along with follow-up of her obesity related diagnoses. [name] is on the Category 4 Plan and states she is following her eating plan approximately 60% of the time. [name] states she is doing kettlebell exercise 10 minutes 1-3 times per week.  Today's visit was #: 23 Starting weight: 344 lbs Starting date: 06/03/2021 Today's weight: 298 lbs Today's date: 09/30/2021 Total lbs lost to date: 46 Total lbs lost since last in-office visit: 0  Interim History: Pt really didn't have time to eat over the weekend while at work. She is currently on her period and feels very fatigued and has significant cravings. She is doing protein shake in the morning and water. Pt is looking for other employment opportunities.  Subjective:   1. Vitamin D deficiency Isabella Guerrero is on Rx Vit D and her last level was 29.4.  2. Type 2 diabetes mellitus with hyperglycemia, without long-term current use of insulin (HCC) Isabella Guerrero's last [id number] was 5.9 with an insulin level of 11.5. She is on Semaglutide.  3. At risk for activity intolerance Isabella Guerrero is at risk for exercise intolerance.  Assessment/Plan:   1. Vitamin D deficiency Low Vitamin D level contributes to fatigue and are associated with obesity, breast, and colon cancer. She agrees to continue to take prescription Vitamin D 50,000 IU every week and will follow-up for routine testing of Vitamin D, at least 2-3 times per year to avoid over-replacement.  Refill- Vitamin D, Ergocalciferol, (DRISDOL) 1.25 MG (50000 UNIT) CAPS capsule; TAKE 1 CAPSULE BY MOUTH EVERY 7 DAYS  Dispense: 4 capsule; Refill: 0  2. Type 2 diabetes mellitus with hyperglycemia, without long-term current use of insulin (HCC) Good blood sugar control is important to decrease the likelihood of diabetic complications such as nephropathy, neuropathy, limb loss, blindness, coronary artery disease, and  death. Intensive lifestyle modification including diet, exercise and weight loss are the first line of treatment for diabetes.   Refill- Semaglutide, 1 MG/DOSE, 4 MG/3ML SOPN; Inject 1 mg as directed once a week.  Dispense: 3 mL; Refill: 0  3. At risk for activity intolerance [name] was given approximately 15 minutes of exercise intolerance counseling today. She is 31 y.o. female and has risk factors exercise intolerance including obesity. We discussed intensive lifestyle modifications today with an emphasis on specific weight loss instructions and strategies. [name] will slowly increase activity as tolerated.  Repetitive spaced learning was employed today to elicit superior memory formation and behavioral change.  4. Obesity with current BMI of 99991111 Isabella Guerrero is currently in the action stage of change. As such, her goal is to continue with weight loss efforts. She has agreed to the Category 4 Plan.   Exercise goals:  As is- Kettlebells 3-4 times a week.  Behavioral modification strategies: increasing lean protein intake, meal planning and cooking strategies, keeping healthy foods in the home, and planning for success.  Isabella Guerrero has agreed to follow-up with our clinic in 3 weeks. She was informed of the importance of frequent follow-up visits to maximize her success with intensive lifestyle modifications for her multiple health conditions.   Objective:   Blood pressure 117/78, pulse 90, temperature 98.5 F (36.9 C), height 5\' 6"  (1.676 m), weight 298 lb (135.2 kg), last menstrual period 09/24/2021, SpO2 96 %. Body mass index is 48.1 kg/m.  General: Cooperative, alert, well developed, in no acute distress. HEENT: Conjunctivae and lids unremarkable. Cardiovascular: Regular rhythm.  Lungs: Normal work of breathing. Neurologic: No focal deficits.   Lab Results  Component Value Date   CREATININE 0.55 (L) 09/09/2021   BUN 10 09/09/2021   NA 143 09/09/2021   K 4.6 09/09/2021   CL 101  09/09/2021   CO2 26 09/09/2021   Lab Results  Component Value Date   ALT 10 09/09/2021   AST 13 09/09/2021   ALKPHOS 71 09/09/2021   BILITOT <0.2 09/09/2021   Lab Results  Component Value Date   HGBA1C 5.9 (H) 09/09/2021   HGBA1C 6.1 (H) 01/20/2021   HGBA1C 6.5 (H) 06/03/2020   Lab Results  Component Value Date   INSULIN 11.5 09/09/2021   INSULIN 15.5 01/20/2021   INSULIN 18.4 06/03/2020   Lab Results  Component Value Date   TSH 3.000 06/03/2020   Lab Results  Component Value Date   CHOL 222 (H) 09/09/2021   HDL 35 (L) 09/09/2021   LDLCALC 163 (H) 09/09/2021   TRIG 131 09/09/2021   Lab Results  Component Value Date   VD25OH 29.4 (L) 09/09/2021   VD25OH 26.7 (L) 01/20/2021   VD25OH 20.8 (L) 06/03/2020   Lab Results  Component Value Date   WBC 11.7 (H) 06/03/2020   HGB 13.5 06/03/2020   HCT 42.7 06/03/2020   MCV 85 06/03/2020   PLT 334 06/03/2020    Attestation Statements:   Reviewed by clinician on day of visit: allergies, medications, problem list, medical history, surgical history, family history, social history, and previous encounter notes.  Coral Ceo, CMA, am acting as transcriptionist for Coralie Common, MD.   I have reviewed the above documentation for accuracy and completeness, and I agree with the above. - Coralie Common, MD

## 2021-10-10 ENCOUNTER — Other Ambulatory Visit (HOSPITAL_COMMUNITY): Payer: Self-pay

## 2021-10-22 ENCOUNTER — Encounter (INDEPENDENT_AMBULATORY_CARE_PROVIDER_SITE_OTHER): Payer: Self-pay | Admitting: Family Medicine

## 2021-10-22 ENCOUNTER — Other Ambulatory Visit (HOSPITAL_COMMUNITY): Payer: Self-pay

## 2021-10-22 ENCOUNTER — Other Ambulatory Visit: Payer: Self-pay

## 2021-10-22 ENCOUNTER — Ambulatory Visit (INDEPENDENT_AMBULATORY_CARE_PROVIDER_SITE_OTHER): Payer: 59 | Admitting: Family Medicine

## 2021-10-22 VITALS — BP 128/80 | HR 90 | Temp 98.8°F | Ht 66.0 in | Wt 296.0 lb

## 2021-10-22 DIAGNOSIS — E1165 Type 2 diabetes mellitus with hyperglycemia: Secondary | ICD-10-CM

## 2021-10-22 DIAGNOSIS — E669 Obesity, unspecified: Secondary | ICD-10-CM | POA: Diagnosis not present

## 2021-10-22 DIAGNOSIS — F419 Anxiety disorder, unspecified: Secondary | ICD-10-CM

## 2021-10-22 DIAGNOSIS — Z6841 Body Mass Index (BMI) 40.0 and over, adult: Secondary | ICD-10-CM

## 2021-10-22 DIAGNOSIS — E66813 Obesity, class 3: Secondary | ICD-10-CM

## 2021-10-22 DIAGNOSIS — E559 Vitamin D deficiency, unspecified: Secondary | ICD-10-CM

## 2021-10-22 DIAGNOSIS — F32A Depression, unspecified: Secondary | ICD-10-CM

## 2021-10-22 DIAGNOSIS — Z7985 Long-term (current) use of injectable non-insulin antidiabetic drugs: Secondary | ICD-10-CM

## 2021-10-22 DIAGNOSIS — Z9189 Other specified personal risk factors, not elsewhere classified: Secondary | ICD-10-CM

## 2021-10-22 MED ORDER — SEMAGLUTIDE (1 MG/DOSE) 4 MG/3ML ~~LOC~~ SOPN
1.0000 mg | PEN_INJECTOR | SUBCUTANEOUS | 0 refills | Status: DC
  Filled 2021-10-22: qty 3, 28d supply, fill #0

## 2021-10-22 MED ORDER — VITAMIN D (ERGOCALCIFEROL) 1.25 MG (50000 UNIT) PO CAPS
ORAL_CAPSULE | ORAL | 0 refills | Status: DC
  Filled 2021-10-22: qty 4, 28d supply, fill #0

## 2021-10-27 NOTE — Progress Notes (Signed)
? ? ? ?Chief Complaint:  ? ?OBESITY ?Isabella Guerrero is here to discuss her progress with her obesity treatment plan along with follow-up of her obesity related diagnoses. Isabella Guerrero is on the Category 4 Plan and states she is following her eating plan approximately 40-50% of the time. Isabella Guerrero states she is doing 0 minutes 0 times per week. ? ?Today's visit was #: 24 ?Starting weight: 344 lbs ?Starting date: 06/03/2021 ?Today's weight: 296 lbs ?Today's date: 10/22/2021 ?Total lbs lost to date: 48 lbs ?Total lbs lost since last in-office visit: 2 lbs ? ?Interim History: Isabella Guerrero has had URI symptoms the last few weeks and did have vertigo. She hasn't been eating much due to nausea from post nasal drip. She has only eaten 40% of food. She was offered a job on 1st shift. She is looking for alternative employment. She was starting to feel better 2 days ago.  ? ?Subjective:  ? ?1. Vitamin D deficiency ?Isabella Guerrero is on prescription Vitamin D. She denies nausea, vomiting and muscle weakness. She notes fatigue.  ? ?2. Type 2 diabetes mellitus with hyperglycemia, without long-term current use of insulin (Isabella Guerrero) ?Isabella Guerrero is currently on Ozempic. Her last A1C was 5.9. Her insulin was 11.5. ? ?3. Anxiety and depression ?Isabella Guerrero denies suicidal ideas and homicidal ideas. She is on Zoloft 100 mg daily currently. Her dog has diabetes and not doing well and her father is in acute liver failure due to chemo.  ? ?4. At risk for depression ?Isabella Guerrero is at elevated risk of depression due to one or more of the following: family history, significant life stressors, medical conditions and/or poor nutrition.  ? ?Assessment/Plan:  ? ?1. Vitamin D deficiency ?Low Vitamin D level contributes to fatigue and are associated with obesity, breast, and colon cancer. We will refill prescription Vitamin D 50,000 IU every week for 1 month with no refills and [facility] will follow-up for routine testing of Vitamin D, at least 2-3 times per year to avoid over-replacement. ? ?-  Vitamin D, Ergocalciferol, (DRISDOL) 1.25 MG (50000 UNIT) CAPS capsule; TAKE 1 CAPSULE BY MOUTH EVERY 7 DAYS  Dispense: 4 capsule; Refill: 0 ? ?2. Type 2 diabetes mellitus with hyperglycemia, without long-term current use of insulin (Isabella Guerrero) ?We will refill Ozempic 1 mg subcutaneous weekly for 1 month with no refills. Good blood sugar control is important to decrease the likelihood of diabetic complications such as nephropathy, neuropathy, limb loss, blindness, coronary artery disease, and death. Intensive lifestyle modification including diet, exercise and weight loss are the first line of treatment for diabetes.  ? ?- Semaglutide, 1 MG/DOSE, 4 MG/3ML SOPN; Inject 1 mg as directed once a week.  Dispense: 3 mL; Refill: 0 ? ?3. Anxiety and depression ?Isabella Guerrero is at risk for depression due to above discussed times coping options for grief. She will follow up on options at next appointment. Behavior modification techniques were discussed today to help Isabella Guerrero deal with her emotional/non-hunger eating behaviors.  Orders and follow up as documented in patient record.  ? ?4. At risk for depression ?Isabella Guerrero was given approximately 15 minutes of depression risk counseling today. She has risk factors for depression. We discussed the importance of a healthy work life balance, a healthy relationship with food and a good support system. ? ?Repetitive spaced learning was employed today to elicit superior memory formation and behavioral change.  ? ?5. Obesity with current BMI of 47.9 ?Isabella Guerrero is currently in the action stage of change. As such, her goal is to continue with weight  loss efforts. She has agreed to the Category 4 Plan.  ? ?Exercise goals: No exercise has been prescribed at this time. ? ?Behavioral modification strategies: increasing lean protein intake, no skipping meals, and meal planning and cooking strategies. ? ?Isabella Guerrero has agreed to follow-up with our clinic in 3 weeks. She was informed of the importance of frequent  follow-up visits to maximize her success with intensive lifestyle modifications for her multiple health conditions.  ? ?Objective:  ? ?Blood pressure 128/80, pulse 90, temperature 98.8 ?F (37.1 ?C), height 5\' 6"  (1.676 m), weight 296 lb (134.3 kg), last menstrual period 09/24/2021, SpO2 98 %. ?Body mass index is 47.78 kg/m?. ? ?General: Cooperative, alert, well developed, in no acute distress. ?HEENT: Conjunctivae and lids unremarkable. ?Cardiovascular: Regular rhythm.  ?Lungs: Normal work of breathing. ?Neurologic: No focal deficits.  ? ?Lab Results  ?Component Value Date  ? CREATININE 0.55 (L) 09/09/2021  ? BUN 10 09/09/2021  ? NA 143 09/09/2021  ? K 4.6 09/09/2021  ? CL 101 09/09/2021  ? CO2 26 09/09/2021  ? ?Lab Results  ?Component Value Date  ? ALT 10 09/09/2021  ? AST 13 09/09/2021  ? ALKPHOS 71 09/09/2021  ? BILITOT <0.2 09/09/2021  ? ?Lab Results  ?Component Value Date  ? HGBA1C 5.9 (H) 09/09/2021  ? HGBA1C 6.1 (H) 01/20/2021  ? HGBA1C 6.5 (H) 06/03/2020  ? ?Lab Results  ?Component Value Date  ? INSULIN 11.5 09/09/2021  ? INSULIN 15.5 01/20/2021  ? INSULIN 18.4 06/03/2020  ? ?Lab Results  ?Component Value Date  ? TSH 3.000 06/03/2020  ? ?Lab Results  ?Component Value Date  ? CHOL 222 (H) 09/09/2021  ? HDL 35 (L) 09/09/2021  ? LDLCALC 163 (H) 09/09/2021  ? TRIG 131 09/09/2021  ? ?Lab Results  ?Component Value Date  ? VD25OH 29.4 (L) 09/09/2021  ? VD25OH 26.7 (L) 01/20/2021  ? VD25OH 20.8 (L) 06/03/2020  ? ?Lab Results  ?Component Value Date  ? WBC 11.7 (H) 06/03/2020  ? HGB 13.5 06/03/2020  ? HCT 42.7 06/03/2020  ? MCV 85 06/03/2020  ? PLT 334 06/03/2020  ? ?No results found for: IRON, TIBC, FERRITIN ? ?Attestation Statements:  ? ?Reviewed by clinician on day of visit: allergies, medications, problem list, medical history, surgical history, family history, social history, and previous encounter notes. ? ?I, Lizbeth Bark, RMA, am acting as Location manager for Coralie Common, DO. ? ?I have reviewed the above  documentation for accuracy and completeness, and I agree with the above. Coralie Common, MD ? ?

## 2021-11-12 ENCOUNTER — Other Ambulatory Visit (HOSPITAL_COMMUNITY): Payer: Self-pay

## 2021-11-12 ENCOUNTER — Encounter (INDEPENDENT_AMBULATORY_CARE_PROVIDER_SITE_OTHER): Payer: Self-pay | Admitting: Family Medicine

## 2021-11-12 ENCOUNTER — Ambulatory Visit (INDEPENDENT_AMBULATORY_CARE_PROVIDER_SITE_OTHER): Payer: 59 | Admitting: Family Medicine

## 2021-11-12 VITALS — BP 130/83 | HR 83 | Temp 98.2°F | Ht 66.0 in | Wt 296.0 lb

## 2021-11-12 DIAGNOSIS — E1165 Type 2 diabetes mellitus with hyperglycemia: Secondary | ICD-10-CM | POA: Diagnosis not present

## 2021-11-12 DIAGNOSIS — F32A Depression, unspecified: Secondary | ICD-10-CM

## 2021-11-12 DIAGNOSIS — F419 Anxiety disorder, unspecified: Secondary | ICD-10-CM

## 2021-11-12 DIAGNOSIS — E559 Vitamin D deficiency, unspecified: Secondary | ICD-10-CM

## 2021-11-12 DIAGNOSIS — E1159 Type 2 diabetes mellitus with other circulatory complications: Secondary | ICD-10-CM

## 2021-11-12 DIAGNOSIS — Z6841 Body Mass Index (BMI) 40.0 and over, adult: Secondary | ICD-10-CM

## 2021-11-12 DIAGNOSIS — E669 Obesity, unspecified: Secondary | ICD-10-CM | POA: Diagnosis not present

## 2021-11-12 DIAGNOSIS — Z9189 Other specified personal risk factors, not elsewhere classified: Secondary | ICD-10-CM

## 2021-11-12 DIAGNOSIS — I152 Hypertension secondary to endocrine disorders: Secondary | ICD-10-CM

## 2021-11-12 DIAGNOSIS — Z7985 Long-term (current) use of injectable non-insulin antidiabetic drugs: Secondary | ICD-10-CM | POA: Diagnosis not present

## 2021-11-12 MED ORDER — BUSPIRONE HCL 10 MG PO TABS
10.0000 mg | ORAL_TABLET | Freq: Three times a day (TID) | ORAL | 1 refills | Status: DC | PRN
  Filled 2021-11-12: qty 90, 30d supply, fill #0

## 2021-11-12 MED ORDER — SERTRALINE HCL 100 MG PO TABS
100.0000 mg | ORAL_TABLET | Freq: Every day | ORAL | 0 refills | Status: DC
  Filled 2021-11-12 – 2022-02-02 (×2): qty 90, 90d supply, fill #0

## 2021-11-12 MED ORDER — SEMAGLUTIDE (1 MG/DOSE) 4 MG/3ML ~~LOC~~ SOPN
1.0000 mg | PEN_INJECTOR | SUBCUTANEOUS | 0 refills | Status: DC
  Filled 2021-11-12: qty 3, 28d supply, fill #0

## 2021-11-12 MED ORDER — HYDROCHLOROTHIAZIDE 12.5 MG PO CAPS
ORAL_CAPSULE | Freq: Every day | ORAL | 1 refills | Status: DC
  Filled 2021-11-12: qty 90, 90d supply, fill #0
  Filled 2022-03-16: qty 90, 90d supply, fill #1

## 2021-11-12 MED ORDER — METOPROLOL SUCCINATE ER 25 MG PO TB24
ORAL_TABLET | Freq: Every day | ORAL | 1 refills | Status: DC
  Filled 2021-11-12: qty 90, 90d supply, fill #0

## 2021-11-12 MED ORDER — VITAMIN D (ERGOCALCIFEROL) 1.25 MG (50000 UNIT) PO CAPS
ORAL_CAPSULE | ORAL | 0 refills | Status: DC
  Filled 2021-11-12: qty 4, 28d supply, fill #0

## 2021-11-13 ENCOUNTER — Other Ambulatory Visit (HOSPITAL_COMMUNITY): Payer: Self-pay

## 2021-11-14 NOTE — Progress Notes (Signed)
? ? ? ?Chief Complaint:  ? ?OBESITY ?Isabella Guerrero is here to discuss her progress with her obesity treatment plan along with follow-up of her obesity related diagnoses. Edwarda is on the Category 4 Plan and states she is following her eating plan approximately 60% of the time. Isabella Guerrero states she is walking for 30 minutes 4 times per week. ? ?Today's visit was #: 25 ?Starting weight: 344 lbs ?Starting date: 06/03/2021 ?Today's weight: 296 lbs ?Today's date: 11/12/2021 ?Total lbs lost to date: 29 ?Total lbs lost since last in-office visit: 0 ? ?Interim History: Isabella Guerrero moved her parents into her house, and her father has an appointment at [facility] for evaluation for trial for cancer treatment (if he doesn't get in he was given 2-6 months). She had 2 interviews last week. She has done some emotional eating given familial stress. Wanting to get off nights.  ? ?Subjective:  ? ?1. Hypertension associated with type 2 diabetes mellitus ([place]) ?Isabella Guerrero's blood pressure is controlled. She is on Microzide and Toprol. She denies chest pain, chest pressure, or headache.  ? ?2. Vitamin D deficiency ?Isabella Guerrero is on prescription Vitamin D. Last Vitamin D level was of 29.4 on 09/09/2021. She notes fatigue.  ? ?3. Type 2 diabetes mellitus with hyperglycemia, without long-term current use of insulin ([place]) ?Isabella Guerrero is on Ozempic 1 mg weekly. Last A1c was 5.9 and insulin 11.5.  ? ?4. Anxiety and depression ?Isabella Guerrero is on Buspar and Zoloft. She denies suicidal or homicidal ideations.  ? ?5. At risk for depression ?Isabella Guerrero is at elevated risk of depression due to her father's terminal diagnosis.  ? ?Assessment/Plan:  ? ?1. Hypertension associated with type 2 diabetes mellitus ([place]) ?Isabella Guerrero will continue her medications, and we will refill Microzide, and Toprol for 90 days with 1 refill.  ? ?- metoprolol succinate (TOPROL-XL) 25 MG 24 hr tablet; TAKE 1 TABLET BY MOUTH ONCE A DAY  Dispense: 90 tablet; Refill: 1 ?- hydrochlorothiazide (MICROZIDE) 12.5 MG capsule;  TAKE 1 CAPSULE BY MOUTH ONCE A DAY  Dispense: 90 capsule; Refill: 1 ? ?2. Vitamin D deficiency ?We will refill prescription Vitamin D 50,000 IU every week for 1 month. Isabella Guerrero will follow-up for routine testing of Vitamin D, at least 2-3 times per year to avoid over-replacement. ? ?- Vitamin D, Ergocalciferol, (DRISDOL) 1.25 MG (50000 UNIT) CAPS capsule; TAKE 1 CAPSULE BY MOUTH EVERY 7 DAYS  Dispense: 4 capsule; Refill: 0 ? ?3. Type 2 diabetes mellitus with hyperglycemia, without long-term current use of insulin ([place]) ?Isabella Guerrero will continue her medications. We will refill Ozempic 1 mg SubQ weekly for 1 month.  ?- Semaglutide, 1 MG/DOSE, 4 MG/3ML SOPN; Inject 1 mg as directed once a week.  Dispense: 3 mL; Refill: 0 ? ?4. Anxiety and depression ?Isabella Guerrero will continue her medications, and we will refill Buspar, and sertraline for 90 days.  ? ?- sertraline (ZOLOFT) 100 MG tablet; Take 1 tablet by mouth daily.  Dispense: 90 tablet; Refill: 0 ?- busPIRone (BUSPAR) 10 MG tablet; Take 1 tablet by mouth 3 times daily as needed.  Dispense: 90 tablet; Refill: 1 ? ?5. At risk for depression ?Isabella Guerrero was given approximately 15 minutes of depression risk counseling today. She has risk factors for depression including stress. We discussed the importance of a healthy work life balance, a healthy relationship with food and a good support system. ? ?Repetitive spaced learning was employed today to elicit superior memory formation and behavioral change. ? ?6. Obesity with current BMI of 47.8 ?Isabella Guerrero is currently  in the action stage of change. As such, her goal is to continue with weight loss efforts. She has agreed to the Category 4 Plan.  ? ?Exercise goals: All adults should avoid inactivity. Some physical activity is better than none, and adults who participate in any amount of physical activity gain some health benefits. ? ?Behavioral modification strategies: increasing lean protein intake, meal planning and cooking strategies, and  keeping healthy foods in the home. ? ?Isabella Guerrero has agreed to follow-up with our clinic in 3 to 4 weeks. She was informed of the importance of frequent follow-up visits to maximize her success with intensive lifestyle modifications for her multiple health conditions.  ? ?Objective:  ? ?Blood pressure 130/83, pulse 83, temperature 98.2 ?F (36.8 ?C), height 5\' 6"  (1.676 m), weight 296 lb (134.3 kg), SpO2 98 %. ?Body mass index is 47.78 kg/m?. ? ?General: Cooperative, alert, well developed, in no acute distress. ?HEENT: Conjunctivae and lids unremarkable. ?Cardiovascular: Regular rhythm.  ?Lungs: Normal work of breathing. ?Neurologic: No focal deficits.  ? ?Lab Results  ?Component Value Date  ? CREATININE 0.55 (L) 09/09/2021  ? BUN 10 09/09/2021  ? NA 143 09/09/2021  ? K 4.6 09/09/2021  ? CL 101 09/09/2021  ? CO2 26 09/09/2021  ? ?Lab Results  ?Component Value Date  ? ALT 10 09/09/2021  ? AST 13 09/09/2021  ? ALKPHOS 71 09/09/2021  ? BILITOT <0.2 09/09/2021  ? ?Lab Results  ?Component Value Date  ? HGBA1C 5.9 (H) 09/09/2021  ? HGBA1C 6.1 (H) 01/20/2021  ? HGBA1C 6.5 (H) 06/03/2020  ? ?Lab Results  ?Component Value Date  ? INSULIN 11.5 09/09/2021  ? INSULIN 15.5 01/20/2021  ? INSULIN 18.4 06/03/2020  ? ?Lab Results  ?Component Value Date  ? TSH 3.000 06/03/2020  ? ?Lab Results  ?Component Value Date  ? CHOL 222 (H) 09/09/2021  ? HDL 35 (L) 09/09/2021  ? LDLCALC 163 (H) 09/09/2021  ? TRIG 131 09/09/2021  ? ?Lab Results  ?Component Value Date  ? VD25OH 29.4 (L) 09/09/2021  ? VD25OH 26.7 (L) 01/20/2021  ? VD25OH 20.8 (L) 06/03/2020  ? ?Lab Results  ?Component Value Date  ? WBC 11.7 (H) 06/03/2020  ? HGB 13.5 06/03/2020  ? HCT 42.7 06/03/2020  ? MCV 85 06/03/2020  ? PLT 334 06/03/2020  ? ?No results found for: IRON, TIBC, FERRITIN ? ?Attestation Statements:  ? ?Reviewed by clinician on day of visit: allergies, medications, problem list, medical history, surgical history, family history, social history, and previous encounter  notes. ? ? ?I, Trixie Dredge, am acting as transcriptionist for Coralie Common, MD. ? ?I have reviewed the above documentation for accuracy and completeness, and I agree with the above. Coralie Common, MD ? ? ?

## 2021-11-15 ENCOUNTER — Other Ambulatory Visit (HOSPITAL_COMMUNITY): Payer: Self-pay

## 2021-11-18 ENCOUNTER — Other Ambulatory Visit (HOSPITAL_COMMUNITY): Payer: Self-pay

## 2021-11-20 ENCOUNTER — Telehealth (INDEPENDENT_AMBULATORY_CARE_PROVIDER_SITE_OTHER): Payer: Self-pay | Admitting: Family Medicine

## 2021-11-20 ENCOUNTER — Encounter (INDEPENDENT_AMBULATORY_CARE_PROVIDER_SITE_OTHER): Payer: Self-pay

## 2021-11-20 NOTE — Telephone Encounter (Signed)
Prior authorization approved for Ozempic. Effective:  11/19/2021 to 11/19/2022. Patient sent approval message via mychart.  ?

## 2021-11-21 ENCOUNTER — Other Ambulatory Visit (HOSPITAL_COMMUNITY): Payer: Self-pay

## 2021-11-24 ENCOUNTER — Other Ambulatory Visit (HOSPITAL_COMMUNITY): Payer: Self-pay

## 2021-11-28 DIAGNOSIS — Z319 Encounter for procreative management, unspecified: Secondary | ICD-10-CM | POA: Diagnosis not present

## 2021-11-28 DIAGNOSIS — Z720 Tobacco use: Secondary | ICD-10-CM | POA: Diagnosis not present

## 2021-11-28 DIAGNOSIS — Z6841 Body Mass Index (BMI) 40.0 and over, adult: Secondary | ICD-10-CM | POA: Diagnosis not present

## 2021-11-28 DIAGNOSIS — Z130 Encounter for screening for diseases of the blood and blood-forming organs and certain disorders involving the immune mechanism: Secondary | ICD-10-CM | POA: Diagnosis not present

## 2021-11-28 DIAGNOSIS — Z01419 Encounter for gynecological examination (general) (routine) without abnormal findings: Secondary | ICD-10-CM | POA: Diagnosis not present

## 2021-11-28 DIAGNOSIS — Z124 Encounter for screening for malignant neoplasm of cervix: Secondary | ICD-10-CM | POA: Diagnosis not present

## 2021-11-28 DIAGNOSIS — Z1151 Encounter for screening for human papillomavirus (HPV): Secondary | ICD-10-CM | POA: Diagnosis not present

## 2021-11-28 DIAGNOSIS — N393 Stress incontinence (female) (male): Secondary | ICD-10-CM | POA: Diagnosis not present

## 2021-11-28 DIAGNOSIS — Z1389 Encounter for screening for other disorder: Secondary | ICD-10-CM | POA: Diagnosis not present

## 2021-12-04 ENCOUNTER — Ambulatory Visit (INDEPENDENT_AMBULATORY_CARE_PROVIDER_SITE_OTHER): Payer: 59 | Admitting: Family Medicine

## 2022-02-02 ENCOUNTER — Other Ambulatory Visit (HOSPITAL_COMMUNITY): Payer: Self-pay

## 2022-02-02 ENCOUNTER — Other Ambulatory Visit (INDEPENDENT_AMBULATORY_CARE_PROVIDER_SITE_OTHER): Payer: Self-pay | Admitting: Family Medicine

## 2022-02-02 DIAGNOSIS — E1165 Type 2 diabetes mellitus with hyperglycemia: Secondary | ICD-10-CM

## 2022-02-03 ENCOUNTER — Other Ambulatory Visit (HOSPITAL_COMMUNITY): Payer: Self-pay

## 2022-02-04 DIAGNOSIS — N6489 Other specified disorders of breast: Secondary | ICD-10-CM | POA: Diagnosis not present

## 2022-02-04 DIAGNOSIS — N6321 Unspecified lump in the left breast, upper outer quadrant: Secondary | ICD-10-CM | POA: Diagnosis not present

## 2022-02-04 DIAGNOSIS — R928 Other abnormal and inconclusive findings on diagnostic imaging of breast: Secondary | ICD-10-CM | POA: Diagnosis not present

## 2022-03-02 ENCOUNTER — Ambulatory Visit (INDEPENDENT_AMBULATORY_CARE_PROVIDER_SITE_OTHER): Payer: BC Managed Care – PPO | Admitting: Family Medicine

## 2022-03-02 ENCOUNTER — Other Ambulatory Visit (HOSPITAL_COMMUNITY): Payer: Self-pay

## 2022-03-02 ENCOUNTER — Encounter (INDEPENDENT_AMBULATORY_CARE_PROVIDER_SITE_OTHER): Payer: Self-pay | Admitting: Family Medicine

## 2022-03-02 VITALS — BP 128/86 | HR 69 | Temp 98.9°F | Ht 66.0 in | Wt 305.0 lb

## 2022-03-02 DIAGNOSIS — E1159 Type 2 diabetes mellitus with other circulatory complications: Secondary | ICD-10-CM | POA: Diagnosis not present

## 2022-03-02 DIAGNOSIS — I152 Hypertension secondary to endocrine disorders: Secondary | ICD-10-CM

## 2022-03-02 DIAGNOSIS — Z6841 Body Mass Index (BMI) 40.0 and over, adult: Secondary | ICD-10-CM

## 2022-03-02 DIAGNOSIS — F419 Anxiety disorder, unspecified: Secondary | ICD-10-CM

## 2022-03-02 DIAGNOSIS — E1165 Type 2 diabetes mellitus with hyperglycemia: Secondary | ICD-10-CM

## 2022-03-02 DIAGNOSIS — E669 Obesity, unspecified: Secondary | ICD-10-CM

## 2022-03-02 DIAGNOSIS — Z7985 Long-term (current) use of injectable non-insulin antidiabetic drugs: Secondary | ICD-10-CM

## 2022-03-02 DIAGNOSIS — K219 Gastro-esophageal reflux disease without esophagitis: Secondary | ICD-10-CM

## 2022-03-02 DIAGNOSIS — E559 Vitamin D deficiency, unspecified: Secondary | ICD-10-CM | POA: Diagnosis not present

## 2022-03-02 DIAGNOSIS — F32A Depression, unspecified: Secondary | ICD-10-CM

## 2022-03-02 MED ORDER — METOPROLOL SUCCINATE ER 25 MG PO TB24
ORAL_TABLET | Freq: Every day | ORAL | 1 refills | Status: DC
  Filled 2022-03-02: qty 90, 90d supply, fill #0

## 2022-03-02 MED ORDER — VITAMIN D (ERGOCALCIFEROL) 1.25 MG (50000 UNIT) PO CAPS
ORAL_CAPSULE | ORAL | 0 refills | Status: DC
  Filled 2022-03-02: qty 4, 28d supply, fill #0

## 2022-03-02 MED ORDER — SEMAGLUTIDE (1 MG/DOSE) 4 MG/3ML ~~LOC~~ SOPN
1.0000 mg | PEN_INJECTOR | SUBCUTANEOUS | 0 refills | Status: DC
  Filled 2022-03-02: qty 3, 28d supply, fill #0

## 2022-03-02 MED ORDER — BUSPIRONE HCL 10 MG PO TABS
10.0000 mg | ORAL_TABLET | Freq: Three times a day (TID) | ORAL | 1 refills | Status: DC | PRN
  Filled 2022-03-02: qty 90, 30d supply, fill #0

## 2022-03-02 MED ORDER — PANTOPRAZOLE SODIUM 20 MG PO TBEC
DELAYED_RELEASE_TABLET | Freq: Every day | ORAL | 0 refills | Status: DC
  Filled 2022-03-02: qty 90, 90d supply, fill #0

## 2022-03-03 ENCOUNTER — Other Ambulatory Visit (HOSPITAL_COMMUNITY): Payer: Self-pay

## 2022-03-04 ENCOUNTER — Other Ambulatory Visit (HOSPITAL_COMMUNITY): Payer: Self-pay

## 2022-03-04 DIAGNOSIS — S93601A Unspecified sprain of right foot, initial encounter: Secondary | ICD-10-CM | POA: Diagnosis not present

## 2022-03-04 DIAGNOSIS — S99921A Unspecified injury of right foot, initial encounter: Secondary | ICD-10-CM | POA: Diagnosis not present

## 2022-03-04 DIAGNOSIS — M7989 Other specified soft tissue disorders: Secondary | ICD-10-CM | POA: Diagnosis not present

## 2022-03-04 DIAGNOSIS — S99911A Unspecified injury of right ankle, initial encounter: Secondary | ICD-10-CM | POA: Diagnosis not present

## 2022-03-04 DIAGNOSIS — W010XXA Fall on same level from slipping, tripping and stumbling without subsequent striking against object, initial encounter: Secondary | ICD-10-CM | POA: Diagnosis not present

## 2022-03-04 DIAGNOSIS — S59902A Unspecified injury of left elbow, initial encounter: Secondary | ICD-10-CM | POA: Diagnosis not present

## 2022-03-04 DIAGNOSIS — S5002XA Contusion of left elbow, initial encounter: Secondary | ICD-10-CM | POA: Diagnosis not present

## 2022-03-04 DIAGNOSIS — M25571 Pain in right ankle and joints of right foot: Secondary | ICD-10-CM | POA: Diagnosis not present

## 2022-03-04 DIAGNOSIS — S93401A Sprain of unspecified ligament of right ankle, initial encounter: Secondary | ICD-10-CM | POA: Diagnosis not present

## 2022-03-04 NOTE — Progress Notes (Signed)
Chief Complaint:   OBESITY Isabella Guerrero is here to discuss her progress with her obesity treatment plan along with follow-up of her obesity related diagnoses. Isabella Guerrero is on the Category 4 Plan and states she is following her eating plan approximately 0% of the time. Isabella Guerrero states she is walking 60 minutes 7 times per week.  Today's visit was #: 26 Starting weight: 344 lbs Starting date: 06/03/2021 Today's weight: 305 lbs Today's date: 03/02/2022 Total lbs lost to date: 39 lbs Total lbs lost since last in-office visit: 0  Interim History: Isabella Guerrero started a new job and is really enjoying it. Her work environment is much less stressful. Free food is offered at her new job. Her dad died at the end of 2022/12/27. Has been up and down in terms of appetite and food intake.   Subjective:   1. Hypertension associated with type 2 diabetes mellitus (HCC) Isabella Guerrero's blood pressure well controlled today. Denies chest pain, chest pressure and headache.  2. Vitamin D deficiency Isabella Guerrero is currently taking prescription Vit D 50,000 IU once a week. Denies any nausea, vomiting or muscle weakness. She notes fatigue.  3. Gastroesophageal reflux disease, unspecified whether esophagitis present Isabella Guerrero is on Protonix with good control of symptoms. Symptoms with Ozempic usage.  4. Type 2 diabetes mellitus with hyperglycemia, without long-term current use of insulin (HCC) Isabella Guerrero has stopped Ozempic with recent stressors at home. Denies GI side effects.  5. Anxiety and depression Isabella Guerrero is on Buspar 10 mg. She had significant increase in symptoms of anxiety with dad's death.   Assessment/Plan:   1. Hypertension associated with type 2 diabetes mellitus (HCC) We will refill Toprol 25 mg by mouth daily for 3 months with 0 refills.  -Refill metoprolol succinate (TOPROL-XL) 25 MG 24 hr tablet; TAKE 1 TABLET BY MOUTH ONCE A DAY  Dispense: 90 tablet; Refill: 1  2. Vitamin D deficiency We will refill Vit D 50,000 IU once  a week for 1 month with 0 refills.   -Refill Vitamin D, Ergocalciferol, (DRISDOL) 1.25 MG (50000 UNIT) CAPS capsule; TAKE 1 CAPSULE BY MOUTH EVERY 7 DAYS  Dispense: 4 capsule; Refill: 0  3. Gastroesophageal reflux disease, unspecified whether esophagitis present We will refill Protonix 20 mg by mouth daily for 3 months with 0 refills.   -Refill pantoprazole (PROTONIX) 20 MG tablet; TAKE 1 TABLET BY MOUTH DAILY.  Dispense: 90 tablet; Refill: 0  4. Type 2 diabetes mellitus with hyperglycemia, without long-term current use of insulin (HCC) We will refill Ozempic 1 mg SubQ once weekly for 1 month with 0 refills.  -Refill Semaglutide, 1 MG/DOSE, 4 MG/3ML SOPN; Inject 1 mg as directed once a week.  Dispense: 3 mL; Refill: 0  5. Anxiety and depression We will refill Buspar 10 mg by mouth three times a day for 1 month with 0 refills.  -Refill busPIRone (BUSPAR) 10 MG tablet; Take 1 tablet by mouth 3 times daily as needed.  Dispense: 90 tablet; Refill: 1  6. Obesity with current BMI of 49.4 Isabella Guerrero is currently in the action stage of change. As such, her goal is to continue with weight loss efforts. She has agreed to the Category 4 Plan.   Exercise goals: All adults should avoid inactivity. Some physical activity is better than none, and adults who participate in any amount of physical activity gain some health benefits.  Behavioral modification strategies: increasing lean protein intake, meal planning and cooking strategies, keeping healthy foods in the home, and planning for  success.  Isabella Guerrero has agreed to follow-up with our clinic in 4 weeks. She was informed of the importance of frequent follow-up visits to maximize her success with intensive lifestyle modifications for her multiple health conditions.   Objective:   Blood pressure 128/86, pulse 69, temperature 98.9 F (37.2 C), height 5\' 6"  (1.676 m), weight (!) 305 lb (138.3 kg), SpO2 93 %. Body mass index is 49.23 kg/m.  General:  Cooperative, alert, well developed, in no acute distress. HEENT: Conjunctivae and lids unremarkable. Cardiovascular: Regular rhythm.  Lungs: Normal work of breathing. Neurologic: No focal deficits.   Lab Results  Component Value Date   CREATININE 0.55 (L) 09/09/2021   BUN 10 09/09/2021   NA 143 09/09/2021   K 4.6 09/09/2021   CL 101 09/09/2021   CO2 26 09/09/2021   Lab Results  Component Value Date   ALT 10 09/09/2021   AST 13 09/09/2021   ALKPHOS 71 09/09/2021   BILITOT <0.2 09/09/2021   Lab Results  Component Value Date   HGBA1C 5.9 (H) 09/09/2021   HGBA1C 6.1 (H) 01/20/2021   HGBA1C 6.5 (H) 06/03/2020   Lab Results  Component Value Date   INSULIN 11.5 09/09/2021   INSULIN 15.5 01/20/2021   INSULIN 18.4 06/03/2020   Lab Results  Component Value Date   TSH 3.000 06/03/2020   Lab Results  Component Value Date   CHOL 222 (H) 09/09/2021   HDL 35 (L) 09/09/2021   LDLCALC 163 (H) 09/09/2021   TRIG 131 09/09/2021   Lab Results  Component Value Date   VD25OH 29.4 (L) 09/09/2021   VD25OH 26.7 (L) 01/20/2021   VD25OH 20.8 (L) 06/03/2020   Lab Results  Component Value Date   WBC 11.7 (H) 06/03/2020   HGB 13.5 06/03/2020   HCT 42.7 06/03/2020   MCV 85 06/03/2020   PLT 334 06/03/2020   No results found for: "IRON", "TIBC", "FERRITIN"  Attestation Statements:   Reviewed by clinician on day of visit: allergies, medications, problem list, medical history, surgical history, family history, social history, and previous encounter notes.  I, 06/05/2020, RMA am acting as transcriptionist for Fortino Sic, MD.  I have reviewed the above documentation for accuracy and completeness, and I agree with the above. - Reuben Likes, MD

## 2022-03-16 ENCOUNTER — Other Ambulatory Visit (HOSPITAL_COMMUNITY): Payer: Self-pay

## 2022-03-17 DIAGNOSIS — L720 Epidermal cyst: Secondary | ICD-10-CM | POA: Diagnosis not present

## 2022-03-18 ENCOUNTER — Encounter (INDEPENDENT_AMBULATORY_CARE_PROVIDER_SITE_OTHER): Payer: Self-pay

## 2022-03-27 ENCOUNTER — Other Ambulatory Visit (HOSPITAL_COMMUNITY): Payer: Self-pay

## 2022-03-27 DIAGNOSIS — Z3046 Encounter for surveillance of implantable subdermal contraceptive: Secondary | ICD-10-CM | POA: Diagnosis not present

## 2022-03-30 ENCOUNTER — Other Ambulatory Visit (HOSPITAL_COMMUNITY): Payer: Self-pay

## 2022-03-30 ENCOUNTER — Encounter (INDEPENDENT_AMBULATORY_CARE_PROVIDER_SITE_OTHER): Payer: Self-pay | Admitting: Family Medicine

## 2022-03-30 ENCOUNTER — Ambulatory Visit (INDEPENDENT_AMBULATORY_CARE_PROVIDER_SITE_OTHER): Payer: BC Managed Care – PPO | Admitting: Family Medicine

## 2022-03-30 VITALS — BP 132/76 | HR 89 | Temp 98.6°F | Ht 66.0 in | Wt 308.0 lb

## 2022-03-30 DIAGNOSIS — E1165 Type 2 diabetes mellitus with hyperglycemia: Secondary | ICD-10-CM | POA: Diagnosis not present

## 2022-03-30 DIAGNOSIS — E1169 Type 2 diabetes mellitus with other specified complication: Secondary | ICD-10-CM

## 2022-03-30 DIAGNOSIS — E669 Obesity, unspecified: Secondary | ICD-10-CM

## 2022-03-30 DIAGNOSIS — E785 Hyperlipidemia, unspecified: Secondary | ICD-10-CM | POA: Diagnosis not present

## 2022-03-30 DIAGNOSIS — Z6841 Body Mass Index (BMI) 40.0 and over, adult: Secondary | ICD-10-CM

## 2022-03-30 DIAGNOSIS — E559 Vitamin D deficiency, unspecified: Secondary | ICD-10-CM

## 2022-03-30 DIAGNOSIS — Z7985 Long-term (current) use of injectable non-insulin antidiabetic drugs: Secondary | ICD-10-CM

## 2022-03-30 MED ORDER — SEMAGLUTIDE (2 MG/DOSE) 8 MG/3ML ~~LOC~~ SOPN
2.0000 mg | PEN_INJECTOR | SUBCUTANEOUS | 0 refills | Status: DC
  Filled 2022-03-30: qty 3, 28d supply, fill #0

## 2022-03-30 MED ORDER — PANTOPRAZOLE SODIUM 20 MG PO TBEC
DELAYED_RELEASE_TABLET | Freq: Every day | ORAL | 0 refills | Status: DC
  Filled 2022-03-30: qty 90, fill #0

## 2022-03-30 MED ORDER — VITAMIN D (ERGOCALCIFEROL) 1.25 MG (50000 UNIT) PO CAPS
ORAL_CAPSULE | ORAL | 0 refills | Status: DC
  Filled 2022-03-30: qty 4, 28d supply, fill #0

## 2022-03-30 MED ORDER — PANTOPRAZOLE SODIUM 20 MG PO TBEC
DELAYED_RELEASE_TABLET | Freq: Every day | ORAL | 0 refills | Status: DC
  Filled 2022-03-30: qty 90, 90d supply, fill #0

## 2022-03-31 ENCOUNTER — Other Ambulatory Visit (HOSPITAL_COMMUNITY): Payer: Self-pay

## 2022-04-03 ENCOUNTER — Encounter (INDEPENDENT_AMBULATORY_CARE_PROVIDER_SITE_OTHER): Payer: Self-pay | Admitting: Family Medicine

## 2022-04-03 DIAGNOSIS — E1165 Type 2 diabetes mellitus with hyperglycemia: Secondary | ICD-10-CM | POA: Diagnosis not present

## 2022-04-03 DIAGNOSIS — E559 Vitamin D deficiency, unspecified: Secondary | ICD-10-CM | POA: Diagnosis not present

## 2022-04-03 DIAGNOSIS — E1169 Type 2 diabetes mellitus with other specified complication: Secondary | ICD-10-CM | POA: Diagnosis not present

## 2022-04-03 LAB — BASIC METABOLIC PANEL
BUN: 16 (ref 4–21)
CO2: 27 — AB (ref 13–22)
Chloride: 101 (ref 99–108)
Creatinine: 0.6 (ref 0.5–1.1)
Glucose: 99
Potassium: 4.1 mEq/L (ref 3.5–5.1)
Sodium: 136 — AB (ref 137–147)

## 2022-04-03 LAB — LIPID PANEL
Cholesterol: 240 — AB (ref 0–200)
HDL: 37 (ref 35–70)
LDL Cholesterol: 176
LDl/HDL Ratio: 5
Triglycerides: 134 (ref 40–160)

## 2022-04-03 LAB — HEPATIC FUNCTION PANEL
ALT: 12 U/L (ref 7–35)
AST: 12 — AB (ref 13–35)
Alkaline Phosphatase: 53 (ref 25–125)
Bilirubin, Total: 0.3

## 2022-04-03 LAB — COMPREHENSIVE METABOLIC PANEL
Albumin: 4.4 (ref 3.5–5.0)
Calcium: 9.4 (ref 8.7–10.7)

## 2022-04-06 NOTE — Telephone Encounter (Signed)
Please advise 

## 2022-04-07 ENCOUNTER — Encounter (INDEPENDENT_AMBULATORY_CARE_PROVIDER_SITE_OTHER): Payer: Self-pay

## 2022-04-07 NOTE — Telephone Encounter (Signed)
FYI

## 2022-04-08 ENCOUNTER — Other Ambulatory Visit (HOSPITAL_COMMUNITY): Payer: Self-pay

## 2022-04-09 NOTE — Progress Notes (Signed)
Chief Complaint:   OBESITY Isabella Guerrero is here to discuss her progress with her obesity treatment plan along with follow-up of her obesity related diagnoses. Isabella Guerrero is on the Category 4 Plan and states she is following her eating plan approximately 40% of the time. Isabella Guerrero states she is swimming and playing outside 30 minutes 3-7 times per week.  Today's visit was #: 27 Starting weight: 344 lbs Starting date: 06/03/2021 Today's weight: 308 lbs Today's date: 03/30/2022 Total lbs lost to date: 36 lbs Total lbs lost since last in-office visit: 0  Interim History: Isabella Guerrero has had unfortunate experience with Nexplanon removal and insertion. ( On removal Nexplanon broke then insertion led to significant bruising and erythema). She is now on Keflex. Struggled with meal plan adherence due to life events. Having a hard time making time care for herself. When other people are cooking she is not always getting everything she needs.  Subjective:   1. Vitamin D deficiency Isabella Guerrero is currently taking prescription Vit D 50,000 IU once a week. Her last Vit D level of 29.4 (from January).  2. Type 2 diabetes mellitus with hyperglycemia, without long-term current use of insulin (HCC) Isabella Guerrero's last A1c was 5.9 (previously 6.5). On Ozempic.  3. Hyperlipidemia associated with type 2 diabetes mellitus (HCC) Isabella Guerrero is not on medication. Her last LDL at 163, HDL at 35, Trigly 131.  Assessment/Plan:   1. Vitamin D deficiency We will obtain labs today. We will refill Vit D 50,000 IU once a week for 1 month with 0 refills.  -Refill Vitamin D, Ergocalciferol, (DRISDOL) 1.25 MG (50000 UNIT) CAPS capsule; TAKE 1 CAPSULE BY MOUTH EVERY 7 DAYS  Dispense: 4 capsule; Refill: 0  - VITAMIN D 25 Hydroxy (Vit-D Deficiency, Fractures)  2. Type 2 diabetes mellitus with hyperglycemia, without long-term current use of insulin (HCC) We will refill/increase Ozempic to 2 mg SubQ weekly for 1 month with 0 refills. We will  refill Protonix 20 mg daily for 1 month with 0 refills.  -Refill/Increase Semaglutide, 2 MG/DOSE, 8 MG/3ML SOPN; Inject 2 mg as directed once a week.  Dispense: 3 mL; Refill: 0  - Comprehensive metabolic panel - Hemoglobin A1c - Insulin, random   -Refill pantoprazole (PROTONIX) 20 MG tablet; TAKE 1 TABLET BY MOUTH DAILY.  Dispense: 90 tablet; Refill: 0  3. Hyperlipidemia associated with type 2 diabetes mellitus (HCC) We will obtain labs today.  - Lipid Panel With LDL/HDL Ratio  4. Obesity with current BMI of 49.7 Isabella Guerrero is currently in the action stage of change. As such, her goal is to continue with weight loss efforts. She has agreed to the Category 4 Plan.   Exercise goals: All adults should avoid inactivity. Some physical activity is better than none, and adults who participate in any amount of physical activity gain some health benefits.  Behavioral modification strategies: increasing lean protein intake, meal planning and cooking strategies, keeping healthy foods in the home, and planning for success.  Isabella Guerrero has agreed to follow-up with our clinic in 3 weeks. She was informed of the importance of frequent follow-up visits to maximize her success with intensive lifestyle modifications for her multiple health conditions.   Isabella Guerrero was informed we would discuss her lab results at her next visit unless there is a critical issue that needs to be addressed sooner. Isabella Guerrero agreed to keep her next visit at the agreed upon time to discuss these results.  Objective:   Blood pressure 132/76, pulse 89, temperature 98.6 F (37  C), height 5\' 6"  (1.676 m), weight (!) 308 lb (139.7 kg), SpO2 97 %. Body mass index is 49.71 kg/m.  General: Cooperative, alert, well developed, in no acute distress. HEENT: Conjunctivae and lids unremarkable. Cardiovascular: Regular rhythm.  Lungs: Normal work of breathing. Neurologic: No focal deficits.   Lab Results  Component Value Date   CREATININE 0.55  (L) 09/09/2021   BUN 10 09/09/2021   NA 143 09/09/2021   K 4.6 09/09/2021   CL 101 09/09/2021   CO2 26 09/09/2021   Lab Results  Component Value Date   ALT 10 09/09/2021   AST 13 09/09/2021   ALKPHOS 71 09/09/2021   BILITOT <0.2 09/09/2021   Lab Results  Component Value Date   HGBA1C 5.9 (H) 09/09/2021   HGBA1C 6.1 (H) 01/20/2021   HGBA1C 6.5 (H) 06/03/2020   Lab Results  Component Value Date   INSULIN 11.5 09/09/2021   INSULIN 15.5 01/20/2021   INSULIN 18.4 06/03/2020   Lab Results  Component Value Date   TSH 3.000 06/03/2020   Lab Results  Component Value Date   CHOL 222 (H) 09/09/2021   HDL 35 (L) 09/09/2021   LDLCALC 163 (H) 09/09/2021   TRIG 131 09/09/2021   Lab Results  Component Value Date   VD25OH 29.4 (L) 09/09/2021   VD25OH 26.7 (L) 01/20/2021   VD25OH 20.8 (L) 06/03/2020   Lab Results  Component Value Date   WBC 11.7 (H) 06/03/2020   HGB 13.5 06/03/2020   HCT 42.7 06/03/2020   MCV 85 06/03/2020   PLT 334 06/03/2020   No results found for: "IRON", "TIBC", "FERRITIN"  Attestation Statements:   Reviewed by clinician on day of visit: allergies, medications, problem list, medical history, surgical history, family history, social history, and previous encounter notes.  I, 06/05/2020, RMA am acting as transcriptionist for Fortino Sic, MD.  I have reviewed the above documentation for accuracy and completeness, and I agree with the above. - Reuben Likes, MD

## 2022-04-16 ENCOUNTER — Other Ambulatory Visit (INDEPENDENT_AMBULATORY_CARE_PROVIDER_SITE_OTHER): Payer: Self-pay

## 2022-04-22 ENCOUNTER — Ambulatory Visit (INDEPENDENT_AMBULATORY_CARE_PROVIDER_SITE_OTHER): Payer: BC Managed Care – PPO | Admitting: Family Medicine

## 2022-04-22 ENCOUNTER — Encounter (INDEPENDENT_AMBULATORY_CARE_PROVIDER_SITE_OTHER): Payer: Self-pay | Admitting: Family Medicine

## 2022-04-22 ENCOUNTER — Other Ambulatory Visit (HOSPITAL_COMMUNITY): Payer: Self-pay

## 2022-04-22 VITALS — BP 131/82 | HR 92 | Temp 99.1°F | Ht 66.0 in | Wt 301.0 lb

## 2022-04-22 DIAGNOSIS — Z7985 Long-term (current) use of injectable non-insulin antidiabetic drugs: Secondary | ICD-10-CM

## 2022-04-22 DIAGNOSIS — I152 Hypertension secondary to endocrine disorders: Secondary | ICD-10-CM | POA: Diagnosis not present

## 2022-04-22 DIAGNOSIS — F32A Depression, unspecified: Secondary | ICD-10-CM

## 2022-04-22 DIAGNOSIS — E559 Vitamin D deficiency, unspecified: Secondary | ICD-10-CM | POA: Diagnosis not present

## 2022-04-22 DIAGNOSIS — F419 Anxiety disorder, unspecified: Secondary | ICD-10-CM

## 2022-04-22 DIAGNOSIS — E669 Obesity, unspecified: Secondary | ICD-10-CM

## 2022-04-22 DIAGNOSIS — E1159 Type 2 diabetes mellitus with other circulatory complications: Secondary | ICD-10-CM

## 2022-04-22 DIAGNOSIS — Z6841 Body Mass Index (BMI) 40.0 and over, adult: Secondary | ICD-10-CM

## 2022-04-22 MED ORDER — METOPROLOL SUCCINATE ER 25 MG PO TB24
ORAL_TABLET | Freq: Every day | ORAL | 0 refills | Status: DC
  Filled 2022-04-22: qty 90, fill #0
  Filled 2022-06-29: qty 90, 90d supply, fill #0

## 2022-04-22 MED ORDER — VITAMIN D (ERGOCALCIFEROL) 1.25 MG (50000 UNIT) PO CAPS
ORAL_CAPSULE | ORAL | 0 refills | Status: DC
  Filled 2022-04-22: qty 4, 28d supply, fill #0

## 2022-04-22 MED ORDER — SERTRALINE HCL 100 MG PO TABS
100.0000 mg | ORAL_TABLET | Freq: Every day | ORAL | 0 refills | Status: DC
  Filled 2022-04-22: qty 90, 90d supply, fill #0

## 2022-04-22 MED ORDER — BUSPIRONE HCL 10 MG PO TABS
10.0000 mg | ORAL_TABLET | Freq: Three times a day (TID) | ORAL | 0 refills | Status: DC | PRN
  Filled 2022-04-22: qty 90, 30d supply, fill #0

## 2022-04-23 ENCOUNTER — Other Ambulatory Visit (HOSPITAL_COMMUNITY): Payer: Self-pay

## 2022-04-25 ENCOUNTER — Other Ambulatory Visit (HOSPITAL_COMMUNITY): Payer: Self-pay

## 2022-04-26 NOTE — Progress Notes (Unsigned)
Chief Complaint:   OBESITY Isabella Guerrero is here to discuss her progress with her obesity treatment plan along with follow-up of her obesity related diagnoses. Isabella Guerrero is on the Category 4 Plan and states she is following her eating plan approximately 70% of the time. Isabella Guerrero states she is walking 15 minutes 7 times per week.  Today's visit was #: 28 Starting weight: 344 lbs Starting date: 06/03/2021 Today's weight: 301 lbs Today's date: 04/22/2022 Total lbs lost to date: 43 Total lbs lost since last in-office visit: 7  Interim History: Isabella Guerrero has gotten more consistent on the Category 4 plan. She is trying to make more conscious decisions on food types. If food is being catered, she is focusing on protein and veggies and limiting sugar. Isabella Guerrero is trying to start implementing more exercise daily.  Subjective:   1. Hypertension associated with type 2 diabetes mellitus (Isabella Guerrero) Isabella Guerrero's blood pressure is well controlled. She denies chest pain, chest pressure, or headache.  2. Vitamin D deficiency Isabella Guerrero's last vitamin D level was 35 and she is currently taking prescription vitamin D 50,000 IU each week. She notes fatigue and denies nausea, vomiting, or muscle weakness.  3. Anxiety and depression Isabella Guerrero is on sertraline and Buspar and her symptoms are well controlled. She denies suicidal or homicidal ideations.  Assessment/Plan:   1. Hypertension associated with type 2 diabetes mellitus ([place]) Tanaka agrees to continue taking metoprolol and will follow up as directed.   - metoprolol succinate (TOPROL-XL) 25 MG 24 hr tablet; TAKE 1 TABLET BY MOUTH ONCE A DAY  Dispense: 90 tablet; Refill: 0  2. Vitamin D deficiency Isabella Guerrero agrees to continue to take prescription vitamin D 50,000IU weekly and will follow up as agreed.  - Vitamin D, Ergocalciferol, (DRISDOL) 1.25 MG (50000 UNIT) CAPS capsule; TAKE 1 CAPSULE BY MOUTH EVERY 7 DAYS  Dispense: 4 capsule; Refill: 0  3. Anxiety and  depression Isabella Guerrero agrees to continue taking Buspar and sertraline and will follow up as directed.  - busPIRone (BUSPAR) 10 MG tablet; Take 1 tablet by mouth 3 times daily as needed.  Dispense: 90 tablet; Refill: 0 - sertraline (ZOLOFT) 100 MG tablet; Take 1 tablet by mouth daily.  Dispense: 90 tablet; Refill: 0  4. Obesity with current BMI of [id number] Isabella Guerrero is currently in the action stage of change. As such, her goal is to continue with weight loss efforts. She has agreed to the Category 4 Plan.   Exercise goals: All adults should avoid inactivity. Some physical activity is better than none, and adults who participate in any amount of physical activity gain some health benefits.  Behavioral modification strategies: increasing lean protein intake, meal planning and cooking strategies, keeping healthy foods in the home, and planning for success.  Isabella Guerrero has agreed to follow-up with our clinic in 4 weeks. She was informed of the importance of frequent follow-up visits to maximize her success with intensive lifestyle modifications for her multiple health conditions.   Objective:   Blood pressure 131/82, pulse 92, temperature 99.1 F (37.3 C), height 5\' 6"  (1.676 m), weight (!) 301 lb (136.5 kg), last menstrual period 03/30/2022, SpO2 97 %. Body mass index is 48.58 kg/m.  General: Cooperative, alert, well developed, in no acute distress. HEENT: Conjunctivae and lids unremarkable. Cardiovascular: Regular rhythm.  Lungs: Normal work of breathing. Neurologic: No focal deficits.   Lab Results  Component Value Date   CREATININE 0.6 04/03/2022   BUN 16 04/03/2022   NA 136 (A) 04/03/2022  K 4.1 04/03/2022   CL 101 04/03/2022   CO2 27 (A) 04/03/2022   Lab Results  Component Value Date   ALT 12 04/03/2022   AST 12 (A) 04/03/2022   ALKPHOS 53 04/03/2022   BILITOT <0.2 09/09/2021   Lab Results  Component Value Date   HGBA1C 5.9 (H) 09/09/2021   HGBA1C 6.1 (H) 01/20/2021   HGBA1C 6.5  (H) 06/03/2020   Lab Results  Component Value Date   INSULIN 11.5 09/09/2021   INSULIN 15.5 01/20/2021   INSULIN 18.4 06/03/2020   Lab Results  Component Value Date   TSH 3.000 06/03/2020   Lab Results  Component Value Date   CHOL 240 (A) 04/03/2022   HDL 37 04/03/2022   LDLCALC 176 04/03/2022   TRIG 134 04/03/2022   Lab Results  Component Value Date   VD25OH 29.4 (L) 09/09/2021   VD25OH 26.7 (L) 01/20/2021   VD25OH 20.8 (L) 06/03/2020   Lab Results  Component Value Date   WBC 11.7 (H) 06/03/2020   HGB 13.5 06/03/2020   HCT 42.7 06/03/2020   MCV 85 06/03/2020   PLT 334 06/03/2020   No results found for: "IRON", "TIBC", "FERRITIN"  Attestation Statements:   Reviewed by clinician on day of visit: allergies, medications, problem list, medical history, surgical history, family history, social history, and previous encounter notes.  IMarcille Blanco, CMA, am acting as transcriptionist for Coralie Common, MD  I have reviewed the above documentation for accuracy and completeness, and I agree with the above. - Coralie Common, MD

## 2022-05-13 DIAGNOSIS — M25562 Pain in left knee: Secondary | ICD-10-CM | POA: Diagnosis not present

## 2022-05-20 ENCOUNTER — Other Ambulatory Visit (HOSPITAL_COMMUNITY): Payer: Self-pay

## 2022-05-20 ENCOUNTER — Ambulatory Visit (INDEPENDENT_AMBULATORY_CARE_PROVIDER_SITE_OTHER): Payer: BC Managed Care – PPO | Admitting: Family Medicine

## 2022-05-20 ENCOUNTER — Encounter (INDEPENDENT_AMBULATORY_CARE_PROVIDER_SITE_OTHER): Payer: Self-pay | Admitting: Family Medicine

## 2022-05-20 VITALS — BP 137/82 | HR 84 | Temp 99.0°F | Ht 66.0 in | Wt 307.0 lb

## 2022-05-20 DIAGNOSIS — Z6841 Body Mass Index (BMI) 40.0 and over, adult: Secondary | ICD-10-CM

## 2022-05-20 DIAGNOSIS — E669 Obesity, unspecified: Secondary | ICD-10-CM | POA: Diagnosis not present

## 2022-05-20 DIAGNOSIS — E1165 Type 2 diabetes mellitus with hyperglycemia: Secondary | ICD-10-CM | POA: Diagnosis not present

## 2022-05-20 DIAGNOSIS — Z7985 Long-term (current) use of injectable non-insulin antidiabetic drugs: Secondary | ICD-10-CM

## 2022-05-20 DIAGNOSIS — E559 Vitamin D deficiency, unspecified: Secondary | ICD-10-CM

## 2022-05-20 MED ORDER — SEMAGLUTIDE (2 MG/DOSE) 8 MG/3ML ~~LOC~~ SOPN
2.0000 mg | PEN_INJECTOR | SUBCUTANEOUS | 0 refills | Status: DC
  Filled 2022-05-20: qty 3, 28d supply, fill #0

## 2022-05-20 MED ORDER — VITAMIN D (ERGOCALCIFEROL) 1.25 MG (50000 UNIT) PO CAPS
ORAL_CAPSULE | ORAL | 0 refills | Status: DC
  Filled 2022-05-20: qty 4, 28d supply, fill #0

## 2022-05-22 ENCOUNTER — Other Ambulatory Visit (HOSPITAL_COMMUNITY): Payer: Self-pay

## 2022-05-25 ENCOUNTER — Encounter (INDEPENDENT_AMBULATORY_CARE_PROVIDER_SITE_OTHER): Payer: Self-pay

## 2022-05-25 NOTE — Progress Notes (Signed)
Chief Complaint:   OBESITY Isabella Guerrero is here to discuss her progress with her obesity treatment plan along with follow-up of her obesity related diagnoses. Isabella Guerrero is on the Category 4 Plan and states she is following her eating plan approximately 60% of the time. Isabella Guerrero states she is exercising 0 minutes 0 times per week.  Today's visit was #: 29 Starting weight: 344 lbs Starting date: 06/03/2021 Today's weight: 307 lbs Today's date: 05/20/2022 Total lbs lost to date: 37 lbs Total lbs lost since last in-office visit: 0  Interim History: Isabella Guerrero has not felt well--legs are hurting from knee down B/L. Pain started fairly suddenly and has really been constant since. Had to stop Ozempic due to GI upset. Craving popcorn more recently.  Subjective:   1. Type 2 diabetes mellitus with hyperglycemia, without long-term current use of insulin (HCC) Isabella Guerrero is on Ozempic up until 2 weeks ago. Last A1c 5.7. Hold Ozempic for a week due to GI upset.  2. Vitamin D deficiency Denies any nausea, vomiting or muscle weakness. Isabella Guerrero notes fatigue.  Assessment/Plan:   1. Type 2 diabetes mellitus with hyperglycemia, without long-term current use of insulin (HCC) We will refill Semaglutide 2 mg SubQ weekly for 1 month with 0 refills.  -Refill Semaglutide, 2 MG/DOSE, 8 MG/3ML SOPN; Inject 2 mg into the skin as directed once a week.  Dispense: 3 mL; Refill: 0  2. Vitamin D deficiency We will refill Vit D 50,000 IU once a week for 1 month with 0 refills.  -Refill Vitamin D, Ergocalciferol, (DRISDOL) 1.25 MG (50000 UNIT) CAPS capsule; TAKE 1 CAPSULE BY MOUTH EVERY 7 DAYS  Dispense: 4 capsule; Refill: 0  3. Obesity with current BMI of 12.2 Isabella Guerrero is currently in the action stage of change. As such, her goal is to continue with weight loss efforts. She has agreed to the Category 4 Plan.   Exercise goals: All adults should avoid inactivity. Some physical activity is better than none, and adults who  participate in any amount of physical activity gain some health benefits.  Behavioral modification strategies: increasing lean protein intake, meal planning and cooking strategies, keeping healthy foods in the home, and planning for success.  Isabella Guerrero has agreed to follow-up with our clinic in 4 weeks. She was informed of the importance of frequent follow-up visits to maximize her success with intensive lifestyle modifications for her multiple health conditions.   Objective:   Blood pressure 137/82, pulse 84, temperature 99 F (37.2 C), height 5\' 6"  (1.676 m), weight (!) 307 lb (139.3 kg), last menstrual period 03/30/2022, SpO2 99 %. Body mass index is 49.55 kg/m.  General: Cooperative, alert, well developed, in no acute distress. HEENT: Conjunctivae and lids unremarkable. Cardiovascular: Regular rhythm.  Lungs: Normal work of breathing. Neurologic: No focal deficits.   Lab Results  Component Value Date   CREATININE 0.6 04/03/2022   BUN 16 04/03/2022   NA 136 (A) 04/03/2022   K 4.1 04/03/2022   CL 101 04/03/2022   CO2 27 (A) 04/03/2022   Lab Results  Component Value Date   ALT 12 04/03/2022   AST 12 (A) 04/03/2022   ALKPHOS 53 04/03/2022   BILITOT <0.2 09/09/2021   Lab Results  Component Value Date   HGBA1C 5.9 (H) 09/09/2021   HGBA1C 6.1 (H) 01/20/2021   HGBA1C 6.5 (H) 06/03/2020   Lab Results  Component Value Date   INSULIN 11.5 09/09/2021   INSULIN 15.5 01/20/2021   INSULIN 18.4 06/03/2020  Lab Results  Component Value Date   TSH 3.000 06/03/2020   Lab Results  Component Value Date   CHOL 240 (A) 04/03/2022   HDL 37 04/03/2022   LDLCALC 176 04/03/2022   TRIG 134 04/03/2022   Lab Results  Component Value Date   VD25OH 29.4 (L) 09/09/2021   VD25OH 26.7 (L) 01/20/2021   VD25OH 20.8 (L) 06/03/2020   Lab Results  Component Value Date   WBC 11.7 (H) 06/03/2020   HGB 13.5 06/03/2020   HCT 42.7 06/03/2020   MCV 85 06/03/2020   PLT 334 06/03/2020   No  results found for: "IRON", "TIBC", "FERRITIN"  Attestation Statements:   Reviewed by clinician on day of visit: allergies, medications, problem list, medical history, surgical history, family history, social history, and previous encounter notes.  I, Elnora Morrison, RMA am acting as transcriptionist for Coralie Common, MD.  I have reviewed the above documentation for accuracy and completeness, and I agree with the above. - Coralie Common, MD

## 2022-06-01 DIAGNOSIS — R109 Unspecified abdominal pain: Secondary | ICD-10-CM | POA: Diagnosis not present

## 2022-06-01 DIAGNOSIS — R1011 Right upper quadrant pain: Secondary | ICD-10-CM | POA: Diagnosis not present

## 2022-06-01 DIAGNOSIS — R319 Hematuria, unspecified: Secondary | ICD-10-CM | POA: Diagnosis not present

## 2022-06-01 DIAGNOSIS — N200 Calculus of kidney: Secondary | ICD-10-CM | POA: Diagnosis not present

## 2022-06-01 DIAGNOSIS — K8020 Calculus of gallbladder without cholecystitis without obstruction: Secondary | ICD-10-CM | POA: Diagnosis not present

## 2022-06-02 ENCOUNTER — Other Ambulatory Visit (HOSPITAL_COMMUNITY): Payer: Self-pay

## 2022-06-02 DIAGNOSIS — K8010 Calculus of gallbladder with chronic cholecystitis without obstruction: Secondary | ICD-10-CM | POA: Diagnosis not present

## 2022-06-02 DIAGNOSIS — R1011 Right upper quadrant pain: Secondary | ICD-10-CM | POA: Diagnosis not present

## 2022-06-02 MED ORDER — ONDANSETRON 4 MG PO TBDP
4.0000 mg | ORAL_TABLET | Freq: Four times a day (QID) | ORAL | 0 refills | Status: DC | PRN
  Filled 2022-06-02: qty 15, 4d supply, fill #0

## 2022-06-03 ENCOUNTER — Ambulatory Visit: Payer: Self-pay | Admitting: Orthopaedic Surgery

## 2022-06-03 ENCOUNTER — Other Ambulatory Visit (HOSPITAL_COMMUNITY): Payer: Self-pay

## 2022-06-04 DIAGNOSIS — E119 Type 2 diabetes mellitus without complications: Secondary | ICD-10-CM | POA: Diagnosis not present

## 2022-06-04 DIAGNOSIS — D649 Anemia, unspecified: Secondary | ICD-10-CM | POA: Diagnosis not present

## 2022-06-04 DIAGNOSIS — K8010 Calculus of gallbladder with chronic cholecystitis without obstruction: Secondary | ICD-10-CM | POA: Diagnosis not present

## 2022-06-04 DIAGNOSIS — K219 Gastro-esophageal reflux disease without esophagitis: Secondary | ICD-10-CM | POA: Diagnosis not present

## 2022-06-04 DIAGNOSIS — K838 Other specified diseases of biliary tract: Secondary | ICD-10-CM | POA: Diagnosis not present

## 2022-06-04 DIAGNOSIS — Z6841 Body Mass Index (BMI) 40.0 and over, adult: Secondary | ICD-10-CM | POA: Diagnosis not present

## 2022-06-04 DIAGNOSIS — Z7985 Long-term (current) use of injectable non-insulin antidiabetic drugs: Secondary | ICD-10-CM | POA: Diagnosis not present

## 2022-06-04 DIAGNOSIS — F1210 Cannabis abuse, uncomplicated: Secondary | ICD-10-CM | POA: Diagnosis not present

## 2022-06-04 HISTORY — PX: CHOLECYSTECTOMY: SHX55

## 2022-06-05 DIAGNOSIS — E119 Type 2 diabetes mellitus without complications: Secondary | ICD-10-CM | POA: Diagnosis not present

## 2022-06-05 DIAGNOSIS — R079 Chest pain, unspecified: Secondary | ICD-10-CM | POA: Diagnosis not present

## 2022-06-05 DIAGNOSIS — M546 Pain in thoracic spine: Secondary | ICD-10-CM | POA: Diagnosis not present

## 2022-06-05 DIAGNOSIS — I10 Essential (primary) hypertension: Secondary | ICD-10-CM | POA: Diagnosis not present

## 2022-06-05 DIAGNOSIS — R109 Unspecified abdominal pain: Secondary | ICD-10-CM | POA: Diagnosis not present

## 2022-06-08 ENCOUNTER — Other Ambulatory Visit (HOSPITAL_COMMUNITY): Payer: Self-pay

## 2022-06-08 ENCOUNTER — Ambulatory Visit (INDEPENDENT_AMBULATORY_CARE_PROVIDER_SITE_OTHER): Payer: BC Managed Care – PPO | Admitting: Family Medicine

## 2022-06-08 ENCOUNTER — Encounter: Payer: Self-pay | Admitting: Family Medicine

## 2022-06-08 VITALS — BP 138/88 | HR 100 | Ht 66.0 in | Wt 310.0 lb

## 2022-06-08 DIAGNOSIS — M546 Pain in thoracic spine: Secondary | ICD-10-CM

## 2022-06-08 DIAGNOSIS — G2589 Other specified extrapyramidal and movement disorders: Secondary | ICD-10-CM

## 2022-06-08 MED ORDER — KETOROLAC TROMETHAMINE 30 MG/ML IJ SOLN
30.0000 mg | Freq: Once | INTRAMUSCULAR | Status: AC
  Administered 2022-06-08: 30 mg via INTRAMUSCULAR

## 2022-06-08 MED ORDER — METHYLPREDNISOLONE ACETATE 40 MG/ML IJ SUSP
40.0000 mg | Freq: Once | INTRAMUSCULAR | Status: AC
  Administered 2022-06-08: 40 mg via INTRAMUSCULAR

## 2022-06-08 MED ORDER — MELOXICAM 15 MG PO TABS
15.0000 mg | ORAL_TABLET | Freq: Every day | ORAL | 0 refills | Status: DC
  Filled 2022-06-08: qty 30, 30d supply, fill #0

## 2022-06-08 NOTE — Progress Notes (Signed)
[name] [address] [place] [address] Phone: [phone] Subjective:   Isabella Guerrero, am serving as a scribe for Dr. Antoine Primas.  I'm seeing this patient by the request  of:  Marylen Ponto, MD  CC: Right back pain  XKG:[id number]  Isabella Guerrero is a 31 y.o. female coming in with complaint of upper back pain. Patient started having back spasms and thought it was her Scapular dyskinesis.  Patient did all her normal things but the pain did not go away. Patient ended up having gall stones and had gall bladder surgery Thursday, then the pain was so bad she went back to the hospital last night and had imaging done. Patient has Oxycodeone 10mg  that sort of helps and the cyclobenzaprine that sort of helps but it just knocks edge of the pain some not a lot. Patient is in so much pain and needs to go back to work but cannot. 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 Patient locates pain on the right upper back by scapular and spine that wraps around under her breast on the right side. She feels like she has been punched in the back and had her breath knocked out of her. Patient went to [place] [facility] and had CT scans that showed no internal bleeding also no some signs of pulmonary embolism.  We do not have this information out.  Patient states that there is no sign of any infectious etiology either with blood work.      Past Medical History:  Diagnosis Date   Asthma    Back pain    Constipation    Fatigue    Foot pain    GDM (gestational diabetes mellitus) 12/2018   GERD (gastroesophageal reflux disease)    Gestational diabetes    Hypertension    Joint pain    Knee pain    Lower back pain    Obesity    Palpitations    Prediabetes    Shortness of breath    Shortness of breath on exertion    Sleep apnea    Past Surgical History:  Procedure Laterality Date   BIOPSY  09/16/2020    Procedure: BIOPSY;  Surgeon: 11/14/2020, MD;  Location: [facility];  Service: Endoscopy;;  EGD and COLON   CESAREAN SECTION N/A 03/01/2019   Procedure: CESAREAN SECTION;  Surgeon: 03/03/2019, MD;  Location: [facility];  Service: Obstetrics;  Laterality: N/A;  need extra Huel Cote in OR and request RNFA   COLONOSCOPY WITH PROPOFOL N/A 09/16/2020   Procedure: COLONOSCOPY WITH PROPOFOL;  Surgeon: 11/14/2020, MD;  Location: [facility];  Service: Endoscopy;  Laterality: N/A;   DRUG INDUCED ENDOSCOPY     ESOPHAGOGASTRODUODENOSCOPY  12/28/2011   Mild gastritis. Otherwise normal EGD   ESOPHAGOGASTRODUODENOSCOPY (EGD) WITH PROPOFOL N/A 09/16/2020   Procedure: ESOPHAGOGASTRODUODENOSCOPY (EGD) WITH PROPOFOL;  Surgeon: 11/14/2020, MD;  Location: [facility];  Service: Endoscopy;  Laterality: N/A;   FINGER SURGERY     POLYPECTOMY  09/16/2020   Procedure: POLYPECTOMY;  Surgeon: 11/14/2020, MD;  Location: [facility];  Service: Endoscopy;;   TONSILLECTOMY     WISDOM TOOTH EXTRACTION     Social History   Socioeconomic History   Marital status: Single    Spouse name: Not on file   Number of children: Not on file   Years of education: Not on file   Highest  education level: Not on file  Occupational History   Occupation: homemaker  Tobacco Use   Smoking status: Every Day    Packs/day: 0.25    Types: Cigarettes    Last attempt to quit: 07/08/2018    Years since quitting: 3.9   Smokeless tobacco: Never   Tobacco comments:    Currently taking medication to assist in cessation  Vaping Use   Vaping Use: Never used  Substance and Sexual Activity   Alcohol use: Not Currently    Alcohol/week: 1.0 standard drink of alcohol    Types: 1 Cans of beer per week    Comment: once in a while   Drug use: Never   Sexual activity: Yes    Partners: Male    Birth control/protection: Implant  Other Topics Concern   Not on file  Social History Narrative   Not on file   Social Determinants of  Health   Financial Resource Strain: Low Risk  (02/20/2019)   Overall Financial Resource Strain (CARDIA)    Difficulty of Paying Living Expenses: Not hard at all  Food Insecurity: No Food Insecurity (02/20/2019)   Hunger Vital Sign    Worried About Running Out of Food in the Last Year: Never true    [place] in the Last Year: Never true  Transportation Needs: Unknown (02/20/2019)   PRAPARE - Hydrologist (Medical): No    Lack of Transportation (Non-Medical): Not on file  Physical Activity: Not on file  Stress: Stress Concern Present (02/20/2019)   [place]    Feeling of Stress : To some extent  Social Connections: Not on file   Allergies  Allergen Reactions   Scallops [Shellfish Allergy] Anaphylaxis and Hives   Adhesive [Tape] Rash   Latex Rash   Lidocaine Other (See Comments)    Pt reports feeling abnormal, feeling faint   Family History  Problem Relation Age of Onset   Diabetes Mother    Hypertension Mother    Heart disease Mother    Hyperlipidemia Mother    Thyroid disease Mother    Obesity Mother    Hypertension Father    Diabetes Father    Hyperlipidemia Father    Heart disease Father    Cancer Father    Sleep apnea Father    Obesity Father    Colon cancer Father        stage 4    Esophageal cancer Maternal Uncle    Colon cancer Maternal Grandmother     Current Outpatient Medications (Endocrine & Metabolic):    etonogestrel (NEXPLANON) 68 MG IMPL implant, 1 each by Subdermal route once. In Left Arm   Semaglutide, 2 MG/DOSE, 8 MG/3ML SOPN, Inject 2 mg into the skin as directed once a week.  Current Outpatient Medications (Cardiovascular):    hydrochlorothiazide (MICROZIDE) 12.5 MG capsule, TAKE 1 CAPSULE BY MOUTH ONCE A DAY   metoprolol succinate (TOPROL-XL) 25 MG 24 hr tablet, TAKE 1 TABLET BY MOUTH ONCE A DAY  Current Outpatient Medications (Respiratory):     fexofenadine (ALLEGRA) 180 MG tablet, Take 180 mg by mouth daily.  Current Outpatient Medications (Analgesics):    meloxicam (MOBIC) 15 MG tablet, Take 1 tablet (15 mg total) by mouth daily.   Current Outpatient Medications (Other):    busPIRone (BUSPAR) 10 MG tablet, Take 1 tablet by mouth 3 times daily as needed.   ondansetron (ZOFRAN-ODT) 4 MG disintegrating tablet, Take 1  tablet (4 mg total) by mouth every 6 (six) hours as needed for nausea.   pantoprazole (PROTONIX) 20 MG tablet, TAKE 1 TABLET BY MOUTH DAILY.   sertraline (ZOLOFT) 100 MG tablet, Take 1 tablet by mouth daily.   Vitamin D, Ergocalciferol, (DRISDOL) 1.25 MG (50000 UNIT) CAPS capsule, TAKE 1 CAPSULE BY MOUTH EVERY 7 DAYS   Reviewed prior external information including notes and imaging from  primary care provider As well as notes that were available from care everywhere and other healthcare systems.  Past medical history, social, surgical and family history all reviewed in electronic medical record.  No pertanent information unless stated regarding to the chief complaint.   Review of Systems:  No headache, visual changes, nausea, vomiting, diarrhea, constipation, dizziness, skin rash, fevers, chills, night sweats, weight loss, swollen lymph nodes, joint swelling, chest pain, mood changes. POSITIVE muscle aches, body aches, abdominal pain  Objective  Blood pressure 138/88, pulse 100, height 5\' 6"  (1.676 m), weight (!) 310 lb (140.6 kg), SpO2 97 %.   General: No apparent distress alert and oriented x3 but patient does appear to be uncomfortable. HEENT: Pupils equal, extraocular movements intact  Respiratory: Patient's speak in full sentences and does not appear short of breath at baseline. Cardiovascular: No lower extremity edema, non tender, no erythema  On exam does not have any significant findings other than postsurgical changes noted at this time.  No rebound tenderness, very mild voluntary guarding noted. Patient  cannot take a deep breath but does appear to be uncomfortable. On exam just diffuse tenderness in the parascapular region on the right side.  Some mild trigger points noted.   Impression and Recommendations:    The above documentation has been reviewed and is accurate and complete , DO

## 2022-06-08 NOTE — Patient Instructions (Signed)
Meloxicam 15 mg start tomorrow no other NSAID'S while taking this medication  Any worsening pain or chest pain please seek medical attention  Injection in backside today Gabapentin take 300mg  for the next three night to help with the pain.  See me again in 3 weeks for manipulation okay to double book

## 2022-06-08 NOTE — Assessment & Plan Note (Addendum)
worsening scapular pain patient did recently just have her gallbladder removed.  I think some of this pain seems to be aggravated from her not being able to be quite as active.  Likely postoperative pain as well.  We discussed with patient at great length about this.  We discussed that the patient was seen in the emergency room where they did do CT abdomen and pelvis as well as CT chest and there was no sign of any type of pulmonary embolism.  We discussed with patient though that if any worsening symptoms I would have her go back to the emergency room.  Patient given a Toradol and Depo-Medrol injection today.  We discussed also worsening pain to seek medical attention but I am hoping that this is more of post inflammatory changes.  Patient given some meloxicam and can start this starting tomorrow follow-up with me again in 3 weeks hopefully we can restart osteopathic manipulation and discuss further. We did discuss with her doing labs I would like a lipase and a D-dimer to rule out the most concerning diagnosis of pulmonary embolism and pancreatitis with patient also being on Ozempic.

## 2022-06-18 ENCOUNTER — Other Ambulatory Visit (HOSPITAL_COMMUNITY): Payer: Self-pay

## 2022-06-25 ENCOUNTER — Encounter (INDEPENDENT_AMBULATORY_CARE_PROVIDER_SITE_OTHER): Payer: Self-pay | Admitting: Family Medicine

## 2022-06-25 ENCOUNTER — Other Ambulatory Visit (HOSPITAL_COMMUNITY): Payer: Self-pay

## 2022-06-25 ENCOUNTER — Ambulatory Visit (INDEPENDENT_AMBULATORY_CARE_PROVIDER_SITE_OTHER): Payer: BC Managed Care – PPO | Admitting: Family Medicine

## 2022-06-25 VITALS — BP 137/83 | HR 98 | Temp 98.9°F | Ht 66.0 in | Wt 306.0 lb

## 2022-06-25 DIAGNOSIS — Z7985 Long-term (current) use of injectable non-insulin antidiabetic drugs: Secondary | ICD-10-CM

## 2022-06-25 DIAGNOSIS — Z6841 Body Mass Index (BMI) 40.0 and over, adult: Secondary | ICD-10-CM

## 2022-06-25 DIAGNOSIS — E559 Vitamin D deficiency, unspecified: Secondary | ICD-10-CM

## 2022-06-25 DIAGNOSIS — E669 Obesity, unspecified: Secondary | ICD-10-CM | POA: Diagnosis not present

## 2022-06-25 DIAGNOSIS — E1165 Type 2 diabetes mellitus with hyperglycemia: Secondary | ICD-10-CM | POA: Diagnosis not present

## 2022-06-25 MED ORDER — SEMAGLUTIDE (2 MG/DOSE) 8 MG/3ML ~~LOC~~ SOPN
2.0000 mg | PEN_INJECTOR | SUBCUTANEOUS | 0 refills | Status: DC
  Filled 2022-06-25: qty 3, 28d supply, fill #0

## 2022-06-25 MED ORDER — VITAMIN D (ERGOCALCIFEROL) 1.25 MG (50000 UNIT) PO CAPS
50000.0000 [IU] | ORAL_CAPSULE | ORAL | 0 refills | Status: DC
  Filled 2022-06-25: qty 4, 28d supply, fill #0

## 2022-06-25 MED ORDER — SEMAGLUTIDE (1 MG/DOSE) 4 MG/3ML ~~LOC~~ SOPN
1.0000 mg | PEN_INJECTOR | SUBCUTANEOUS | 0 refills | Status: DC
  Filled 2022-06-25: qty 3, 28d supply, fill #0

## 2022-06-25 NOTE — Progress Notes (Signed)
[name] [address] [place] [address] Phone: [phone] Subjective:   [name], am serving as a scribe for Dr. Antoine Primas.  I'm seeing this patient by the request  of:  Marylen Ponto, MD  CC: Multiple complaints  YKD:[id number]  Isabella Guerrero is a 31 y.o. female coming in with complaint of back and neck pain. OMT 06/08/2022. Patient states that she is having pain in lateral aspect of R hip. Pain is worse with sleeping. Lumbar flexion also increases her pain. Back pain is getting better as it is not as severe.  Patient states that she constantly has some type of discomfort.  Using 100,000IU of Vit D weekly. Also had B12 injection recently.  States that it has improved some of her energy but has not made much difference in her pain.   Medications patient has been prescribed: Meloxicam  Taking:         Reviewed prior external information including notes and imaging from previsou exam, outside providers and external EMR if available.   As well as notes that were available from care everywhere and other healthcare systems.  Past medical history, social, surgical and family history all reviewed in electronic medical record.  No pertanent information unless stated regarding to the chief complaint.   Past Medical History:  Diagnosis Date   Asthma    Back pain    Constipation    Fatigue    Foot pain    GDM (gestational diabetes mellitus) 12/2018   GERD (gastroesophageal reflux disease)    Gestational diabetes    Hypertension    Joint pain    Knee pain    Lower back pain    Obesity    Palpitations    Prediabetes    Shortness of breath    Shortness of breath on exertion    Sleep apnea     Allergies  Allergen Reactions   Scallops [Shellfish Allergy] Anaphylaxis and Hives   Adhesive [Tape] Rash   Latex Rash   Lidocaine Other (See Comments)    Pt reports feeling abnormal, feeling faint     Review of Systems:   No headache, visual changes, nausea, vomiting, diarrhea, constipation, dizziness, abdominal pain, skin rash, fevers, chills, night sweats, weight loss, swollen lymph nodes,  chest pain, shortness of breath, mood changes. POSITIVE muscle aches, body aches, joint swelling  Objective  Blood pressure (!) 152/108, pulse (!) 101, height 5\' 6"  (1.676 m), weight (!) 311 lb (141.1 kg), SpO2 98 %.   General: No apparent distress alert and oriented x3 mood and affect normal, dressed appropriately.  HEENT: Pupils equal, extraocular movements intact  Respiratory: Patient's speak in full sentences and does not appear short of breath  Cardiovascular: No lower extremity edema, non tender, no erythema  MSK:  Back does have some loss of lordosis.  Some tenderness to palpation in the paraspinal musculature.  Patient does have significant tightness noted in the parascapular region right greater than left.  Some limited sidebending bilaterally.  Osteopathic findings  C2 flexed rotated and side bent right C7 flexed rotated and side bent left T3 extended rotated and side bent right inhaled rib T8 extended rotated and side bent left T10 extended rotated and side bent right L1 flexed rotated and side bent right L5 flexed rotated and side bent left Sacrum right on right       Assessment and Plan:  Scapular dyskinesis Significant scapular dyskinesis and started on osteopathic manipulation  today.  Hopefully patient will make some improvement.  Has some tightness as well.  Discussed which activities to do and which ones to avoid.  Patient is to increase activity slowly.  See how she responds.  Can continue the meloxicam and the gabapentin.  Follow-up again in 6 to 8 weeks.  Due to the leukocytosis patient has had as well as patient's hypertension and cholesterolemia I do want to get some other laboratory work-up to make sure nothing else is contributing.  Patient is a smoker as well.  Follow-up again as stated in 6  to 8 weeks    Nonallopathic problems  Decision today to treat with OMT was based on Physical Exam  After verbal consent patient was treated with HVLA, ME, FPR techniques in cervical, rib, thoracic, lumbar, and sacral  areas  Patient tolerated the procedure well with improvement in symptoms  Patient given exercises, stretches and lifestyle modifications  See medications in patient instructions if given  Patient will follow up in 4-8 weeks     The above documentation has been reviewed and is accurate and complete Judi Saa, DO         Note: This dictation was prepared with Dragon dictation along with smaller phrase technology. Any transcriptional errors that result from this process are unintentional.

## 2022-06-29 ENCOUNTER — Ambulatory Visit (INDEPENDENT_AMBULATORY_CARE_PROVIDER_SITE_OTHER): Payer: BC Managed Care – PPO | Admitting: Family Medicine

## 2022-06-29 ENCOUNTER — Other Ambulatory Visit (INDEPENDENT_AMBULATORY_CARE_PROVIDER_SITE_OTHER): Payer: Self-pay | Admitting: Family Medicine

## 2022-06-29 ENCOUNTER — Encounter (INDEPENDENT_AMBULATORY_CARE_PROVIDER_SITE_OTHER): Payer: Self-pay | Admitting: Family Medicine

## 2022-06-29 ENCOUNTER — Encounter: Payer: Self-pay | Admitting: Family Medicine

## 2022-06-29 VITALS — BP 152/108 | HR 101 | Ht 66.0 in | Wt 311.0 lb

## 2022-06-29 DIAGNOSIS — E1159 Type 2 diabetes mellitus with other circulatory complications: Secondary | ICD-10-CM

## 2022-06-29 DIAGNOSIS — E559 Vitamin D deficiency, unspecified: Secondary | ICD-10-CM

## 2022-06-29 DIAGNOSIS — G2589 Other specified extrapyramidal and movement disorders: Secondary | ICD-10-CM | POA: Diagnosis not present

## 2022-06-29 DIAGNOSIS — I152 Hypertension secondary to endocrine disorders: Secondary | ICD-10-CM

## 2022-06-29 DIAGNOSIS — M9904 Segmental and somatic dysfunction of sacral region: Secondary | ICD-10-CM | POA: Diagnosis not present

## 2022-06-29 DIAGNOSIS — M9908 Segmental and somatic dysfunction of rib cage: Secondary | ICD-10-CM | POA: Diagnosis not present

## 2022-06-29 DIAGNOSIS — M9901 Segmental and somatic dysfunction of cervical region: Secondary | ICD-10-CM | POA: Diagnosis not present

## 2022-06-29 DIAGNOSIS — E1165 Type 2 diabetes mellitus with hyperglycemia: Secondary | ICD-10-CM

## 2022-06-29 DIAGNOSIS — M2550 Pain in unspecified joint: Secondary | ICD-10-CM

## 2022-06-29 DIAGNOSIS — M9903 Segmental and somatic dysfunction of lumbar region: Secondary | ICD-10-CM | POA: Diagnosis not present

## 2022-06-29 DIAGNOSIS — E612 Magnesium deficiency: Secondary | ICD-10-CM | POA: Diagnosis not present

## 2022-06-29 DIAGNOSIS — M9902 Segmental and somatic dysfunction of thoracic region: Secondary | ICD-10-CM

## 2022-06-29 MED ORDER — CHOLECALCIFEROL 1.25 MG (50000 UT) PO CAPS
50000.0000 [IU] | ORAL_CAPSULE | ORAL | 0 refills | Status: DC
  Filled 2022-06-29: qty 4, 28d supply, fill #0

## 2022-06-29 NOTE — Telephone Encounter (Signed)
Please advise 

## 2022-06-29 NOTE — Patient Instructions (Addendum)
Lets get some labs SPEP, DHEA, ACTH, cortisol, ESR, CRP, TSH, Free T3, T4, (print them out please) Exercises 3 times a week.  See me again in 5-6 weeks  Happy holidays!

## 2022-06-29 NOTE — Assessment & Plan Note (Signed)
Significant scapular dyskinesis and started on osteopathic manipulation today.  Hopefully patient will make some improvement.  Has some tightness as well.  Discussed which activities to do and which ones to avoid.  Patient is to increase activity slowly.  See how she responds.  Can continue the meloxicam and the gabapentin.  Follow-up again in 6 to 8 weeks.  Due to the leukocytosis patient has had as well as patient's hypertension and cholesterolemia I do want to get some other laboratory work-up to make sure nothing else is contributing.  Patient is a smoker as well.  Follow-up again as stated in 6 to 8 weeks

## 2022-06-30 ENCOUNTER — Other Ambulatory Visit (HOSPITAL_COMMUNITY): Payer: Self-pay

## 2022-06-30 DIAGNOSIS — M2550 Pain in unspecified joint: Secondary | ICD-10-CM | POA: Diagnosis not present

## 2022-07-07 ENCOUNTER — Other Ambulatory Visit (HOSPITAL_COMMUNITY): Payer: Self-pay

## 2022-07-07 DIAGNOSIS — Z6841 Body Mass Index (BMI) 40.0 and over, adult: Secondary | ICD-10-CM | POA: Diagnosis not present

## 2022-07-07 DIAGNOSIS — M5431 Sciatica, right side: Secondary | ICD-10-CM | POA: Diagnosis not present

## 2022-07-07 DIAGNOSIS — F418 Other specified anxiety disorders: Secondary | ICD-10-CM | POA: Diagnosis not present

## 2022-07-07 MED ORDER — BUPROPION HCL ER (XL) 150 MG PO TB24
150.0000 mg | ORAL_TABLET | Freq: Every day | ORAL | 2 refills | Status: DC
  Filled 2022-07-07: qty 30, 30d supply, fill #0
  Filled 2022-07-20: qty 30, 30d supply, fill #1

## 2022-07-08 NOTE — Progress Notes (Signed)
Chief Complaint:   OBESITY Isabella Guerrero is here to discuss her progress with her obesity treatment plan along with follow-up of her obesity related diagnoses. Isabella Guerrero is on the Category 4 Plan and states she is following her eating plan approximately 0% of the time. Isabella Guerrero states she is exercising 0 minutes 0 times per week.  Today's visit was #: 30 Starting weight: 344 lbs Starting date: 06/03/2021 Today's weight: 306 lbs Today's date: 06/25/2022 Total lbs lost to date: 38 lbs Total lbs lost since last in-office visit: 1  Interim History: Isabella Guerrero has been dealing with significant back pain and is s/p cholecystectomy. She has been not physically able to do much. Wants to get back on Cat 4 plan. For Thanksgiving she will be hosting but not cooking.  Subjective:   1. Type 2 diabetes mellitus with hyperglycemia, without long-term current use of insulin (HCC) Isabella Guerrero is on Ozempic 2 mg weekly. Her last A1c was 5.9.  2. Vitamin D deficiency Isabella Guerrero's last Vit D level of 29.4. Denies any nausea, vomiting or muscle weakness. She notes fatigue.  Assessment/Plan:   1. Type 2 diabetes mellitus with hyperglycemia, without long-term current use of insulin (HCC) We will decrease/refill Ozempic 1 mg SubQ weekly for 1 month with 0 refills.  -Decrease/Refill Semaglutide, 1 MG/DOSE, 4 MG/3ML SOPN; Inject 1 mg as directed once a week.  Dispense: 3 mL; Refill: 0  2. Vitamin D deficiency We will refill Vit D 50K IU once weekly for 1 month with 0 refills.  3. Obesity with current BMI of 49.5 Isabella Guerrero is currently in the action stage of change. As such, her goal is to continue with weight loss efforts. She has agreed to the Category 4 Plan.   Exercise goals: All adults should avoid inactivity. Some physical activity is better than none, and adults who participate in any amount of physical activity gain some health benefits.  Behavioral modification strategies: increasing lean protein intake, meal  planning and cooking strategies, keeping healthy foods in the home, and planning for success.  Isabella Guerrero has agreed to follow-up with our clinic in 4 weeks. She was informed of the importance of frequent follow-up visits to maximize her success with intensive lifestyle modifications for her multiple health conditions.   Objective:   Blood pressure 137/83, pulse 98, temperature 98.9 F (37.2 C), height 5\' 6"  (1.676 m), weight (!) 306 lb (138.8 kg), SpO2 96 %. Body mass index is 49.39 kg/m.  General: Cooperative, alert, well developed, in no acute distress. HEENT: Conjunctivae and lids unremarkable. Cardiovascular: Regular rhythm.  Lungs: Normal work of breathing. Neurologic: No focal deficits.   Lab Results  Component Value Date   CREATININE 0.6 04/03/2022   BUN 16 04/03/2022   NA 136 (A) 04/03/2022   K 4.1 04/03/2022   CL 101 04/03/2022   CO2 27 (A) 04/03/2022   Lab Results  Component Value Date   ALT 12 04/03/2022   AST 12 (A) 04/03/2022   ALKPHOS 53 04/03/2022   BILITOT <0.2 09/09/2021   Lab Results  Component Value Date   HGBA1C 5.9 (H) 09/09/2021   HGBA1C 6.1 (H) 01/20/2021   HGBA1C 6.5 (H) 06/03/2020   Lab Results  Component Value Date   INSULIN 11.5 09/09/2021   INSULIN 15.5 01/20/2021   INSULIN 18.4 06/03/2020   Lab Results  Component Value Date   TSH 3.000 06/03/2020   Lab Results  Component Value Date   CHOL 240 (A) 04/03/2022   HDL 37 04/03/2022  [place] 176 04/03/2022   TRIG 134 04/03/2022   Lab Results  Component Value Date   VD25OH 29.4 (L) 09/09/2021   VD25OH 26.7 (L) 01/20/2021   VD25OH 20.8 (L) 06/03/2020   Lab Results  Component Value Date   WBC 11.7 (H) 06/03/2020   HGB 13.5 06/03/2020   HCT 42.7 06/03/2020   MCV 85 06/03/2020   PLT 334 06/03/2020   No results found for: "IRON", "TIBC", "FERRITIN"  Attestation Statements:   Reviewed by clinician on day of visit: allergies, medications, problem list, medical history, surgical  history, family history, social history, and previous encounter notes.  I, Elnora Morrison, RMA am acting as transcriptionist for Coralie Common, MD.  I have reviewed the above documentation for accuracy and completeness, and I agree with the above. - Coralie Common, MD

## 2022-07-20 ENCOUNTER — Encounter: Payer: Self-pay | Admitting: Cardiology

## 2022-07-20 ENCOUNTER — Other Ambulatory Visit (HOSPITAL_COMMUNITY): Payer: Self-pay

## 2022-07-20 ENCOUNTER — Other Ambulatory Visit: Payer: Self-pay

## 2022-07-22 ENCOUNTER — Ambulatory Visit: Payer: BC Managed Care – PPO | Attending: Cardiology | Admitting: Cardiology

## 2022-07-22 ENCOUNTER — Other Ambulatory Visit (HOSPITAL_COMMUNITY): Payer: Self-pay

## 2022-07-22 ENCOUNTER — Encounter: Payer: Self-pay | Admitting: Cardiology

## 2022-07-22 VITALS — BP 138/92 | HR 78 | Ht 66.0 in | Wt 314.0 lb

## 2022-07-22 DIAGNOSIS — R0609 Other forms of dyspnea: Secondary | ICD-10-CM

## 2022-07-22 DIAGNOSIS — F17200 Nicotine dependence, unspecified, uncomplicated: Secondary | ICD-10-CM

## 2022-07-22 DIAGNOSIS — IMO0001 Reserved for inherently not codable concepts without codable children: Secondary | ICD-10-CM

## 2022-07-22 DIAGNOSIS — G4733 Obstructive sleep apnea (adult) (pediatric): Secondary | ICD-10-CM | POA: Insufficient documentation

## 2022-07-22 DIAGNOSIS — E785 Hyperlipidemia, unspecified: Secondary | ICD-10-CM

## 2022-07-22 DIAGNOSIS — I517 Cardiomegaly: Secondary | ICD-10-CM | POA: Insufficient documentation

## 2022-07-22 DIAGNOSIS — R079 Chest pain, unspecified: Secondary | ICD-10-CM

## 2022-07-22 DIAGNOSIS — E1169 Type 2 diabetes mellitus with other specified complication: Secondary | ICD-10-CM

## 2022-07-22 HISTORY — DX: Nicotine dependence, unspecified, uncomplicated: F17.200

## 2022-07-22 HISTORY — DX: Reserved for inherently not codable concepts without codable children: IMO0001

## 2022-07-22 HISTORY — DX: Other forms of dyspnea: R06.09

## 2022-07-22 HISTORY — DX: Hyperlipidemia, unspecified: E78.5

## 2022-07-22 HISTORY — DX: Cardiomegaly: I51.7

## 2022-07-22 HISTORY — DX: Obstructive sleep apnea (adult) (pediatric): G47.33

## 2022-07-22 MED ORDER — ATORVASTATIN CALCIUM 10 MG PO TABS
10.0000 mg | ORAL_TABLET | Freq: Every day | ORAL | 3 refills | Status: DC
  Filled 2022-07-22: qty 90, 90d supply, fill #0

## 2022-07-22 MED ORDER — ASPIRIN 81 MG PO TBEC: 81.0000 mg | DELAYED_RELEASE_TABLET | Freq: Every day | ORAL | 3 refills | Status: AC

## 2022-07-22 NOTE — Progress Notes (Unsigned)
Cardiology Consultation:    Date:  07/22/2022   ID:  Isabella Guerrero, DOB [birth year], MRN [id number]  PCP:  Marylen Ponto, MD  Cardiologist:  Gypsy Balsam, MD   Referring MD: Buckner Malta, MD   Chief Complaint  Patient presents with   Chest Pain   heart fluttering    Ongoing for years    History of Present Illness:    Isabella Guerrero is a 31 y.o. female who is being seen today for the evaluation of cardiomegaly at the request of Buckner Malta, MD. past medical history significant for smoking which is still ongoing, morbid obesity, recently recognized diabetes, dyslipidemia.  She was referred to [place] because of cardiomegaly discovered on CT of her chest.  She described to have some fatigue tiredness shortness of breath.  Very rare palpitations when she feels her heart speeding up for short period time, also described to have some snoring at night.  She does have family history of coronary artery disease but not premature, she does have multiple risk factors for coronary artery disease, namely hypertension dyslipidemia smoking and diabetes as well as family history.  She tried to be a little more active right now she did have some probably back but now is better.  She is on diet trying to lose weight.  Past Medical History:  Diagnosis Date   Asthma    Back pain    Constipation    Fatigue    Foot pain    GDM (gestational diabetes mellitus) 12/2018   GERD (gastroesophageal reflux disease)    Gestational diabetes    Hypertension    Joint pain    Knee pain    Lower back pain    Obesity    Palpitations    Prediabetes    Shortness of breath    Shortness of breath on exertion    Sleep apnea     Past Surgical History:  Procedure Laterality Date   BIOPSY  09/16/2020   Procedure: BIOPSY;  Surgeon: Lynann Bologna, MD;  Location: [facility];  Service: Endoscopy;;  EGD and COLON   CESAREAN SECTION N/A 03/01/2019   Procedure: CESAREAN SECTION;  Surgeon: Huel Cote, MD;  Location: [facility];  Service: Obstetrics;  Laterality: N/A;  need extra in OR and request RNFA   CHOLECYSTECTOMY  06/04/2022   COLONOSCOPY WITH PROPOFOL N/A 09/16/2020   Procedure: COLONOSCOPY WITH PROPOFOL;  Surgeon: Lynann Bologna, MD;  Location: [facility];  Service: Endoscopy;  Laterality: N/A;   DRUG INDUCED ENDOSCOPY     ESOPHAGOGASTRODUODENOSCOPY  12/28/2011   Mild gastritis. Otherwise normal EGD   ESOPHAGOGASTRODUODENOSCOPY (EGD) WITH PROPOFOL N/A 09/16/2020   Procedure: ESOPHAGOGASTRODUODENOSCOPY (EGD) WITH PROPOFOL;  Surgeon: Lynann Bologna, MD;  Location: [facility];  Service: Endoscopy;  Laterality: N/A;   FINGER SURGERY     POLYPECTOMY  09/16/2020   Procedure: POLYPECTOMY;  Surgeon: Lynann Bologna, MD;  Location: [facility];  Service: Endoscopy;;   TONSILLECTOMY     WISDOM TOOTH EXTRACTION      Current Medications: Current Meds  Medication Sig   buPROPion (WELLBUTRIN XL) 150 MG 24 hr tablet Take 1 tablet (150 mg total) by mouth daily.   busPIRone (BUSPAR) 10 MG tablet Take 10 mg by mouth daily.   Cholecalciferol 1.25 MG (50000 UT) capsule Take 1 capsule (50,000 Units total) by mouth once a week.   etonogestrel (NEXPLANON) 68 MG IMPL implant 1 each by Subdermal route once. In Left Arm   gabapentin (NEURONTIN)  300 MG capsule Take 300 mg by mouth as needed (pain).   hydrochlorothiazide (MICROZIDE) 12.5 MG capsule Take 12.5 mg by mouth daily.   meloxicam (MOBIC) 15 MG tablet Take 1 tablet (15 mg total) by mouth daily.   metoprolol succinate (TOPROL-XL) 25 MG 24 hr tablet Take 25 mg by mouth daily.   omeprazole (PRILOSEC) 40 MG capsule Take 40 mg by mouth daily.   pantoprazole (PROTONIX) 20 MG tablet TAKE 1 TABLET BY MOUTH DAILY. (Patient taking differently: Take 20 mg by mouth daily.)   Semaglutide, 1 MG/DOSE, 4 MG/3ML SOPN Inject 1 mg as directed once a week.   sertraline (ZOLOFT) 100 MG tablet Take 1 tablet by mouth daily.   tamsulosin (FLOMAX) 0.4 MG  CAPS capsule Take 0.4 mg by mouth daily.     Allergies:   Scallops [shellfish allergy], Nsaids, Adhesive [tape], Latex, and Lidocaine   Social History   Socioeconomic History   Marital status: Single    Spouse name: Not on file   Number of children: Not on file   Years of education: Not on file   Highest education level: Not on file  Occupational History   Occupation: homemaker  Tobacco Use   Smoking status: Every Day    Packs/day: 0.25    Types: Cigarettes    Last attempt to quit: 07/08/2018    Years since quitting: 4.0   Smokeless tobacco: Never   Tobacco comments:    Currently taking medication to assist in cessation  Vaping Use   Vaping Use: Never used  Substance and Sexual Activity   Alcohol use: Not Currently    Alcohol/week: 1.0 standard drink of alcohol    Types: 1 Cans of beer per week    Comment: once in a while   Drug use: Never   Sexual activity: Yes    Partners: Male    Birth control/protection: Implant  Other Topics Concern   Not on file  Social History Narrative   Not on file   Social Determinants of Health   Financial Resource Strain: Low Risk  (02/20/2019)   Overall Financial Resource Strain (CARDIA)    Difficulty of Paying Living Expenses: Not hard at all  Food Insecurity: No Food Insecurity (02/20/2019)   Hunger Vital Sign    Worried About Running Out of Food in the Last Year: Never true    [place] in the Last Year: Never true  Transportation Needs: Unknown (02/20/2019)   PRAPARE - Hydrologist (Medical): No    Lack of Transportation (Non-Medical): Not on file  Physical Activity: Not on file  Stress: Stress Concern Present (02/20/2019)   [place]    Feeling of Stress : To some extent  Social Connections: Not on file     Family History: The patient's family history includes Cancer in her father; Colon cancer in her father and maternal  grandmother; Diabetes in her father and mother; Esophageal cancer in her maternal uncle; Heart disease in her father and mother; Hyperlipidemia in her father and mother; Hypertension in her father and mother; Obesity in her father and mother; Sleep apnea in her father; Thyroid disease in her mother. ROS:   Please see the history of present illness.    All 14 point review of systems negative except as described per history of present illness.  EKGs/Labs/Other Studies Reviewed:    The following studies were reviewed today: CT reviewed no calcification of  the coronary arteries  EKG:  EKG is  ordered today.  The ekg ordered today demonstrates normal sinus rhythm low voltage EKG, cannot rule out anteroseptal wall MI.  Recent Labs: 04/03/2022: ALT 12; BUN 16; Creatinine 0.6; Potassium 4.1; Sodium 136  Recent Lipid Panel    Component Value Date/Time   CHOL 240 (A) 04/03/2022 0000   CHOL 222 (H) 09/09/2021 0825   TRIG 134 04/03/2022 0000   HDL 37 04/03/2022 0000   HDL 35 (L) 09/09/2021 0825   LDLCALC 176 04/03/2022 0000   LDLCALC 163 (H) 09/09/2021 0825    Physical Exam:    VS:  BP (!) 138/92 (BP Location: Left Arm, Patient Position: Sitting)   Pulse 78   Ht 5\' 6"  (1.676 m)   Wt (!) 314 lb (142.4 kg)   SpO2 97%   BMI 50.68 kg/m     Wt Readings from Last 3 Encounters:  07/22/22 (!) 314 lb (142.4 kg)  06/01/22 (!) 308 lb (139.7 kg)  06/29/22 (!) 311 lb (141.1 kg)     GEN:  Well nourished, well developed in no acute distress HEENT: Normal NECK: No JVD; No carotid bruits LYMPHATICS: No lymphadenopathy CARDIAC: RRR, no murmurs, no rubs, no gallops RESPIRATORY:  Clear to auscultation without rales, wheezing or rhonchi  ABDOMEN: Soft, non-tender, non-distended MUSCULOSKELETAL:  No edema; No deformity  SKIN: Warm and dry NEUROLOGIC:  Alert and oriented x 3 PSYCHIATRIC:  Normal affect   ASSESSMENT:    1. Chest pain of uncertain etiology   2. Cardiomegaly   3. Type 2 diabetes  mellitus with other specified complication, without long-term current use of insulin ([place])   4. Dyspnea on exertion   5. Dyslipidemia   6. Obstructive sleep apnea   7. Smoking    PLAN:    In order of problems listed above:  Cardiomegaly discovered on CT.  She will have echocardiogram to recheck size of the heart as well as function of the heart.  She does not have any signs and symptoms of congestive heart failure but judgment is somewhat difficult because of her morbid obesity. Diabetes type 2.  That being managed by the internal medicine team.  Recently started therapy for it.  The key will be risk factors modifications. Dyslipidemia I did review her K PN which show me her LDL 176 HDL 37.  She is diabetic regardless what her cholesterol is she need to be on moderate intensity statin, will initiate Lipitor 10 daily, fasting lipid profile AST LT will be checked within next 6 weeks. Excessive daytime somnolence plus snoring I strongly suspect sleep apnea.  Will schedule her to have a sleep study. Smoking still ongoing problem but however she determined to quit actually Christmas days the day that she is can have last cigarettes.  I encouraged her to do that.   Medication Adjustments/Labs and Tests Ordered: Current medicines are reviewed at length with the patient today.  Concerns regarding medicines are outlined above.  Orders Placed This Encounter  Procedures   EKG 12-Lead   No orders of the defined types were placed in this encounter.   Signed, Park Liter, MD, [facility]. 07/22/2022 2:30 PM    [place]

## 2022-07-22 NOTE — Patient Instructions (Addendum)
Medication Instructions:    START: Aspirin 81mg  enteric coated - 1 daily  START: Lipitor 10mg  1 tablet daily   Lab Work: Your physician recommends that you return for lab work in: 6 weeks after starting Lipitor You need to have labs done when you are fasting.  You can come Monday through Friday 8:30 am to 12:00 pm and 1:15 to 4:30. You do not need to make an appointment as the order has already been placed. The labs you are going to have done are AST, ALT Lipids.    Testing/Procedures: Your physician has requested that you have an echocardiogram. Echocardiography is a painless test that uses sound waves to create images of your heart. It provides your doctor with information about the size and shape of your heart and how well your heart's chambers and valves are working. This procedure takes approximately one hour. There are no restrictions for this procedure. Please do NOT wear cologne, perfume, aftershave, or lotions (deodorant is allowed). Please arrive 15 minutes prior to your appointment time.   Itamar sleep study- will call when authorized to use.   Follow-Up: At [facility], you and your health needs are our priority.  As part of our continuing mission to provide you with exceptional heart care, we have created designated Provider Care Teams.  These Care Teams include your primary Cardiologist (physician) and Advanced Practice Providers (APPs -  Physician Assistants and Nurse Practitioners) who all work together to provide you with the care you need, when you need it.  We recommend signing up for the patient portal called "MyChart".  Sign up information is provided on this After Visit Summary.  MyChart is used to connect with patients for Virtual Visits (Telemedicine).  Patients are able to view lab/test results, encounter notes, upcoming appointments, etc.  Non-urgent messages can be sent to your provider as well.   To learn more about what you can do with MyChart, go to  Wednesday.    Your next appointment:   3 month(s)  The format for your next appointment:   In Person  Provider:   [facility], MD    Other Instructions NA

## 2022-07-23 ENCOUNTER — Other Ambulatory Visit (HOSPITAL_COMMUNITY): Payer: Self-pay

## 2022-07-23 ENCOUNTER — Ambulatory Visit (INDEPENDENT_AMBULATORY_CARE_PROVIDER_SITE_OTHER): Payer: BC Managed Care – PPO | Admitting: Family Medicine

## 2022-07-23 ENCOUNTER — Other Ambulatory Visit: Payer: Self-pay

## 2022-07-23 ENCOUNTER — Encounter (INDEPENDENT_AMBULATORY_CARE_PROVIDER_SITE_OTHER): Payer: Self-pay | Admitting: Family Medicine

## 2022-07-23 VITALS — BP 135/88 | HR 98 | Temp 98.9°F | Ht 66.0 in | Wt 313.0 lb

## 2022-07-23 DIAGNOSIS — E669 Obesity, unspecified: Secondary | ICD-10-CM | POA: Diagnosis not present

## 2022-07-23 DIAGNOSIS — E559 Vitamin D deficiency, unspecified: Secondary | ICD-10-CM

## 2022-07-23 DIAGNOSIS — Z716 Tobacco abuse counseling: Secondary | ICD-10-CM | POA: Diagnosis not present

## 2022-07-23 DIAGNOSIS — Z7985 Long-term (current) use of injectable non-insulin antidiabetic drugs: Secondary | ICD-10-CM

## 2022-07-23 DIAGNOSIS — E1165 Type 2 diabetes mellitus with hyperglycemia: Secondary | ICD-10-CM

## 2022-07-23 DIAGNOSIS — Z6841 Body Mass Index (BMI) 40.0 and over, adult: Secondary | ICD-10-CM

## 2022-07-23 DIAGNOSIS — F17210 Nicotine dependence, cigarettes, uncomplicated: Secondary | ICD-10-CM | POA: Diagnosis not present

## 2022-07-23 MED ORDER — SEMAGLUTIDE (1 MG/DOSE) 4 MG/3ML ~~LOC~~ SOPN
1.0000 mg | PEN_INJECTOR | SUBCUTANEOUS | 0 refills | Status: DC
  Filled 2022-07-23: qty 3, 28d supply, fill #0

## 2022-07-23 MED ORDER — CHOLECALCIFEROL 1.25 MG (50000 UT) PO CAPS
50000.0000 [IU] | ORAL_CAPSULE | ORAL | 0 refills | Status: DC
  Filled 2022-07-23: qty 4, 28d supply, fill #0

## 2022-07-24 ENCOUNTER — Other Ambulatory Visit: Payer: Self-pay

## 2022-08-07 ENCOUNTER — Ambulatory Visit: Payer: BC Managed Care – PPO | Attending: Cardiology

## 2022-08-07 DIAGNOSIS — I517 Cardiomegaly: Secondary | ICD-10-CM | POA: Diagnosis not present

## 2022-08-07 LAB — ECHOCARDIOGRAM COMPLETE
Area-P 1/2: 6.83 cm2
S' Lateral: 3.3 cm

## 2022-08-12 ENCOUNTER — Telehealth: Payer: Self-pay

## 2022-08-12 NOTE — Progress Notes (Signed)
Chief Complaint:   OBESITY Isabella Guerrero is here to discuss her progress with her obesity treatment plan along with follow-up of her obesity related diagnoses. Isabella Guerrero is on the Category 4 Plan and states she is following her eating plan approximately 50% of the time. Isabella Guerrero states she is exercising 30 minutes 3 times per week.  Today's visit was #: 88 Starting weight: 344 lbs Starting date: 06/03/2021 Today's weight: 313 lbs Today's date: 07/23/2022 Total lbs lost to date: 31 lbs Total lbs lost since last in-office visit: 0  Interim History: Isabella Guerrero has been trying to heal from surgery and eat leftovers. Had quite a bit of medical happenings since last appointment. Cardiomegaly may have been seen on CT scan. Awaiting sleep study insurance approval. Wondering about gastric sleeve. Made quit a day for smoking.  Subjective:   1. Encounter for tobacco use cessation counseling Isabella Guerrero made quite date for Dec 25th. Plans to do cold [place]. Tried to do the patch;gum, Chantix. Feels desire of hand to mouth is important.  2. Type 2 diabetes mellitus with hyperglycemia, without long-term current use of insulin (HCC) Isabella Guerrero is on Ozempic 1 mg weekly. A1c was 5.9 in January 2023.  3. Vitamin D deficiency Isabella Guerrero is currently taking prescription Vit D 50,000 IU once a week. Denies any nausea, vomiting or muscle weakness. Last Vit D level lower than previously.  Assessment/Plan:   1. Encounter for tobacco use cessation counseling Discussed numerous tobacco cessations products and strategies to assist in successful cessation. Follow up on cessation at next appointment.  2. Type 2 diabetes mellitus with hyperglycemia, without long-term current use of insulin (HCC) We will refill Ozempic 1 mg SubQ weekly for 1 month with 0 refills.  -Refill Semaglutide, 1 MG/DOSE, 4 MG/3ML SOPN; Inject 1 mg as directed once a week.  Dispense: 3 mL; Refill: 0  3. Vitamin D deficiency We will refill Vit D 50K IU once  a week for 1 month with 0 refills.  -Refill Cholecalciferol 1.25 MG (50000 UT) capsule; Take 1 capsule (50,000 Units total) by mouth once a week.  Dispense: 4 capsule; Refill: 0  4. Obesity with current BMI of 25.3 Isabella Guerrero is currently in the action stage of change. As such, her goal is to continue with weight loss efforts. She has agreed to the Category 4 Plan.   Exercise goals: All adults should avoid inactivity. Some physical activity is better than none, and adults who participate in any amount of physical activity gain some health benefits.  Behavioral modification strategies: increasing lean protein intake, meal planning and cooking strategies, keeping healthy foods in the home, and planning for success.  Isabella Guerrero has agreed to follow-up with our clinic in 4 weeks. She was informed of the importance of frequent follow-up visits to maximize her success with intensive lifestyle modifications for her multiple health conditions.   Objective:   Blood pressure 135/88, pulse 98, temperature 98.9 F (37.2 C), height 5\' 6"  (1.676 m), weight (!) 313 lb (142 kg), SpO2 97 %. Body mass index is 50.52 kg/m.  General: Cooperative, alert, well developed, in no acute distress. HEENT: Conjunctivae and lids unremarkable. Cardiovascular: Regular rhythm.  Lungs: Normal work of breathing. Neurologic: No focal deficits.   Lab Results  Component Value Date   CREATININE 0.6 04/03/2022   BUN 16 04/03/2022   NA 136 (A) 04/03/2022   K 4.1 04/03/2022   CL 101 04/03/2022   CO2 27 (A) 04/03/2022   Lab Results  Component Value Date  ALT 12 04/03/2022   AST 12 (A) 04/03/2022   ALKPHOS 53 04/03/2022   BILITOT <0.2 09/09/2021   Lab Results  Component Value Date   HGBA1C 5.9 (H) 09/09/2021   HGBA1C 6.1 (H) 01/20/2021   HGBA1C 6.5 (H) 06/03/2020   Lab Results  Component Value Date   INSULIN 11.5 09/09/2021   INSULIN 15.5 01/20/2021   INSULIN 18.4 06/03/2020   Lab Results  Component Value Date    TSH 3.000 06/03/2020   Lab Results  Component Value Date   CHOL 240 (A) 04/03/2022   HDL 37 04/03/2022   LDLCALC 176 04/03/2022   TRIG 134 04/03/2022   Lab Results  Component Value Date   VD25OH 29.4 (L) 09/09/2021   VD25OH 26.7 (L) 01/20/2021   VD25OH 20.8 (L) 06/03/2020   Lab Results  Component Value Date   WBC 11.7 (H) 06/03/2020   HGB 13.5 06/03/2020   HCT 42.7 06/03/2020   MCV 85 06/03/2020   PLT 334 06/03/2020   No results found for: "IRON", "TIBC", "FERRITIN"  Attestation Statements:   Reviewed by clinician on day of visit: allergies, medications, problem list, medical history, surgical history, family history, social history, and previous encounter notes.  Time spent on visit including pre-visit chart review and post-visit care and charting 30 minutes 12 of which was dedicated to counseling.   I, Elnora Morrison, RMA am acting as transcriptionist for Coralie Common, MD.  I have reviewed the above documentation for accuracy and completeness, and I agree with the above. - Coralie Common, MD

## 2022-08-12 NOTE — Telephone Encounter (Signed)
Results reviewed with pt as per Dr. Krasowski's note.  Pt verbalized understanding and had no additional questions. Routed to PCP  

## 2022-08-13 ENCOUNTER — Ambulatory Visit: Payer: BC Managed Care – PPO | Admitting: Gastroenterology

## 2022-08-13 NOTE — Progress Notes (Deleted)
  [place] Phone: [phone] Subjective:    I'm seeing this patient by the request  of:  Ronita Hipps, MD  CC:   [id number]  Isabella Guerrero is a 32 y.o. female coming in with complaint of back and neck pain. OMT 06/29/2022. Patient states   Medications patient has been prescribed: Meloxicam  Taking:         Reviewed prior external information including notes and imaging from previsou exam, outside providers and external EMR if available.   As well as notes that were available from care everywhere and other healthcare systems.  Past medical history, social, surgical and family history all reviewed in electronic medical record.  No pertanent information unless stated regarding to the chief complaint.   Past Medical History:  Diagnosis Date   Asthma    Back pain    Constipation    Fatigue    Foot pain    GDM (gestational diabetes mellitus) 12/2018   GERD (gastroesophageal reflux disease)    Gestational diabetes    Hypertension    Joint pain    Knee pain    Lower back pain    Obesity    Palpitations    Prediabetes    Shortness of breath    Shortness of breath on exertion    Sleep apnea     Allergies  Allergen Reactions   Scallops [Shellfish Allergy] Anaphylaxis and Hives   Nsaids Other (See Comments)    GI Bleed   Adhesive [Tape] Rash   Latex Rash   Lidocaine Other (See Comments)    Pt reports feeling abnormal, feeling faint     Review of Systems:  No headache, visual changes, nausea, vomiting, diarrhea, constipation, dizziness, abdominal pain, skin rash, fevers, chills, night sweats, weight loss, swollen lymph nodes, body aches, joint swelling, chest pain, shortness of breath, mood changes. POSITIVE muscle aches  Objective  There were no vitals taken for this visit.   General: No apparent distress alert and oriented x3 mood and affect normal, dressed appropriately.  HEENT:  Pupils equal, extraocular movements intact  Respiratory: Patient's speak in full sentences and does not appear short of breath  Cardiovascular: No lower extremity edema, non tender, no erythema  Gait MSK:  Back   Osteopathic findings  C2 flexed rotated and side bent right C6 flexed rotated and side bent left T3 extended rotated and side bent right inhaled rib T9 extended rotated and side bent left L2 flexed rotated and side bent right Sacrum right on right       Assessment and Plan:  No problem-specific Assessment & Plan notes found for this encounter.    Nonallopathic problems  Decision today to treat with OMT was based on Physical Exam  After verbal consent patient was treated with HVLA, ME, FPR techniques in cervical, rib, thoracic, lumbar, and sacral  areas  Patient tolerated the procedure well with improvement in symptoms  Patient given exercises, stretches and lifestyle modifications  See medications in patient instructions if given  Patient will follow up in 4-8 weeks             Note: This dictation was prepared with Dragon dictation along with smaller phrase technology. Any transcriptional errors that result from this process are unintentional.

## 2022-08-19 ENCOUNTER — Telehealth: Payer: Self-pay | Admitting: Cardiology

## 2022-08-19 ENCOUNTER — Ambulatory Visit: Payer: BC Managed Care – PPO | Admitting: Family Medicine

## 2022-08-19 NOTE — Telephone Encounter (Signed)
Left message for patient to call back  

## 2022-08-19 NOTE — Telephone Encounter (Signed)
Spoke with pt and gave her PIN number for the Itamar sleep study. Also she will come by to get instructions for the Itamar as she misplaced her copy

## 2022-08-19 NOTE — Telephone Encounter (Signed)
  Pt is calling to get PIN for Itamar sleep study

## 2022-08-20 ENCOUNTER — Ambulatory Visit (INDEPENDENT_AMBULATORY_CARE_PROVIDER_SITE_OTHER): Payer: BC Managed Care – PPO | Admitting: Family Medicine

## 2022-08-20 ENCOUNTER — Encounter (INDEPENDENT_AMBULATORY_CARE_PROVIDER_SITE_OTHER): Payer: Self-pay | Admitting: Family Medicine

## 2022-08-20 VITALS — BP 140/83 | HR 89 | Temp 98.7°F | Ht 66.0 in | Wt 310.0 lb

## 2022-08-20 DIAGNOSIS — E559 Vitamin D deficiency, unspecified: Secondary | ICD-10-CM | POA: Diagnosis not present

## 2022-08-20 DIAGNOSIS — E1159 Type 2 diabetes mellitus with other circulatory complications: Secondary | ICD-10-CM

## 2022-08-20 DIAGNOSIS — I152 Hypertension secondary to endocrine disorders: Secondary | ICD-10-CM

## 2022-08-20 DIAGNOSIS — F419 Anxiety disorder, unspecified: Secondary | ICD-10-CM

## 2022-08-20 DIAGNOSIS — F32A Depression, unspecified: Secondary | ICD-10-CM

## 2022-08-20 DIAGNOSIS — E1165 Type 2 diabetes mellitus with hyperglycemia: Secondary | ICD-10-CM

## 2022-08-20 DIAGNOSIS — E669 Obesity, unspecified: Secondary | ICD-10-CM

## 2022-08-20 DIAGNOSIS — Z7985 Long-term (current) use of injectable non-insulin antidiabetic drugs: Secondary | ICD-10-CM

## 2022-08-20 DIAGNOSIS — Z6841 Body Mass Index (BMI) 40.0 and over, adult: Secondary | ICD-10-CM

## 2022-08-20 MED ORDER — HYDROCHLOROTHIAZIDE 12.5 MG PO CAPS
12.5000 mg | ORAL_CAPSULE | Freq: Every day | ORAL | 1 refills | Status: DC
  Filled 2022-08-20: qty 90, 90d supply, fill #0

## 2022-08-20 MED ORDER — PANTOPRAZOLE SODIUM 20 MG PO TBEC
20.0000 mg | DELAYED_RELEASE_TABLET | Freq: Every day | ORAL | 0 refills | Status: DC
  Filled 2022-08-20: qty 90, 90d supply, fill #0

## 2022-08-20 MED ORDER — METOPROLOL SUCCINATE ER 25 MG PO TB24
25.0000 mg | ORAL_TABLET | Freq: Every day | ORAL | 1 refills | Status: DC
  Filled 2022-08-20: qty 90, 90d supply, fill #0

## 2022-08-20 MED ORDER — CHOLECALCIFEROL 1.25 MG (50000 UT) PO CAPS
50000.0000 [IU] | ORAL_CAPSULE | ORAL | 0 refills | Status: DC
  Filled 2022-08-20: qty 4, 28d supply, fill #0

## 2022-08-20 MED ORDER — SERTRALINE HCL 100 MG PO TABS
100.0000 mg | ORAL_TABLET | Freq: Every day | ORAL | 0 refills | Status: DC
  Filled 2022-08-20: qty 90, 90d supply, fill #0

## 2022-08-20 MED ORDER — SEMAGLUTIDE (1 MG/DOSE) 4 MG/3ML ~~LOC~~ SOPN
1.0000 mg | PEN_INJECTOR | SUBCUTANEOUS | 0 refills | Status: DC
  Filled 2022-08-20: qty 3, 28d supply, fill #0

## 2022-08-21 ENCOUNTER — Encounter (INDEPENDENT_AMBULATORY_CARE_PROVIDER_SITE_OTHER): Payer: BC Managed Care – PPO | Admitting: Cardiology

## 2022-08-21 ENCOUNTER — Other Ambulatory Visit (HOSPITAL_COMMUNITY): Payer: Self-pay

## 2022-08-21 DIAGNOSIS — G4733 Obstructive sleep apnea (adult) (pediatric): Secondary | ICD-10-CM | POA: Diagnosis not present

## 2022-08-23 NOTE — Procedures (Signed)
SLEEP STUDY REPORT Patient Information Study Date: 25/36/6440 Patient Name: Isabella Guerrero Patient ID: [id number] Birth Date: 12-08-1990 Age: 32 Gender: Female BMI: 50.7 (W=315 lb, H=5' 6'') Referring Physician: Jenne Campus, MD  TEST DESCRIPTION:  Home sleep apnea testing was completed using the WatchPat, a Type 1 device, utilizing peripheral arterial tonometry (PAT), chest movement, actigraphy, pulse oximetry, pulse rate, body position and snore.  AHI was calculated with apnea and hypopnea using valid sleep time as the denominator. RDI includes apneas, hypopneas, and RERAs.  The data acquired and the scoring of sleep and all associated events were performed in accordance with the recommended standards and specifications as outlined in the AASM Manual for the Scoring of Sleep and Associated Events 2.2.0 (2015).  FINDINGS:  1.  Moderate Obstructive Sleep Apnea with AHI 25.9/hr.   2.  No Central Sleep Apnea with pAHIc 1.7/hr.  3.  Oxygen desaturations as low as 69%.  4.  Severe snoring was present. O2 sats were < 88% for 64.5 min.  5.  Total sleep time was 7 hrs and 4 min.  6.  25.5% of total sleep time was spent in REM sleep.   7.  Normal sleep onset latency at 17 min  8.  Normal REM sleep onset latency at 95 min.   9.  Total awakenings were 20.  10. Arrhythmia detection:  None.  DIAGNOSIS:   Moderate Obstructive Sleep Apnea (G47.33) Nocturnal Hypoxemia  RECOMMENDATIONS:   1.  Clinical correlation of these findings is necessary.  The decision to treat obstructive sleep apnea (OSA) is usually based on the presence of apnea symptoms or the presence of associated medical conditions such as Hypertension, Congestive Heart Failure, Atrial Fibrillation or Obesity.  The most common symptoms of OSA are snoring, gasping for breath while sleeping, daytime sleepiness and fatigue.   2.  Initiating apnea therapy is recommended given the presence of symptoms and/or associated conditions.  Recommend proceeding with one of the following:     a.  Auto-CPAP therapy with a pressure range of 5-20cm H2O.     b.  An oral appliance (OA) that can be obtained from certain dentists with expertise in sleep medicine.  These are primarily of use in non-obese patients with mild and moderate disease.     c.  An ENT consultation which may be useful to look for specific causes of obstruction and possible treatment options.     d.  If patient is intolerant to PAP therapy, consider referral to ENT for evaluation for hypoglossal nerve stimulator.   3.  Close follow-up is necessary to ensure success with CPAP or oral appliance therapy for maximum benefit.  4.  A follow-up oximetry study on CPAP is recommended to assess the adequacy of therapy and determine the need for supplemental oxygen or the potential need for Bi-level therapy.  An arterial blood gas to determine the adequacy of baseline ventilation and oxygenation should also be considered.  5.  Healthy sleep recommendations include:  adequate nightly sleep (normal 7-9 hrs/night), avoidance of caffeine after noon and alcohol near bedtime, and maintaining a sleep environment that is cool, dark and quiet.  6.  Weight loss for overweight patients is recommended.  Even modest amounts of weight loss can significantly improve the severity of sleep apnea.  7.  Snoring recommendations include:  weight loss where appropriate, side sleeping, and avoidance of alcohol before bed.  8.  Operation of motor vehicle should not be performed when sleepy.  Signature: Fransico Him, MD;  [facility]; [place], Tax adviser of Sleep Medicine Electronically Signed: 08/24/2022

## 2022-08-24 ENCOUNTER — Ambulatory Visit: Payer: BC Managed Care – PPO | Attending: Cardiology

## 2022-08-24 DIAGNOSIS — G4733 Obstructive sleep apnea (adult) (pediatric): Secondary | ICD-10-CM

## 2022-08-28 DIAGNOSIS — G4733 Obstructive sleep apnea (adult) (pediatric): Secondary | ICD-10-CM | POA: Diagnosis not present

## 2022-08-31 NOTE — Progress Notes (Signed)
Chief Complaint:   OBESITY Isabella Guerrero is here to discuss her progress with her obesity treatment plan along with follow-up of her obesity related diagnoses. Isabella Guerrero is on the Category 4 Plan and states she is following her eating plan approximately 40% of the time. Isabella Guerrero states she is exercising 0 minutes 0 times per week.  Today's visit was #: 50 Starting weight: 344 lbs Starting date: 06/03/2021 Today's weight: 310 lbs Today's date: 08/20/2022 Total lbs lost to date: 34 lbs Total lbs lost since last in-office visit: 3  Interim History: Isabella Guerrero voices, everyone sick over Christmas and week after.  Watched videos for sleeve gastrectomy and thinks this may be an avenue she wishes to pursue.  She has not bought any cigarettes but may have gotten a few from other people.  Subjective:   1. Type 2 diabetes mellitus with hyperglycemia, without long-term current use of insulin (HCC) Isabella Guerrero is on Semaglutide 1 mg.  2. Hypertension associated with type 2 diabetes mellitus ([place]) Isabella Guerrero is on Metoprolo, HCTZ.  Blood pressure slightly elevated.  3. Vitamin D deficiency Last Vit D level low.  Denies any nausea, vomiting or muscle weakness. She notes fatigue.  4. Anxiety and depression Isabella Guerrero is on Buspar, Sertraline.  Symptoms better controlled.  Assessment/Plan:   1. Type 2 diabetes mellitus with hyperglycemia, without long-term current use of insulin (HCC) We will refill Semaglutide 1 mg SubQ once weekly for 1 month AND refill Protonix 20 mg daily for 3 months with 0 refills.  -Refill Semaglutide, 1 MG/DOSE, 4 MG/3ML SOPN; Inject 1 mg as directed once a week.  Dispense: 3 mL; Refill: 0  - Refill pantoprazole (PROTONIX) 20 MG tablet; Take 1 tablet (20 mg total) by mouth daily.  Dispense: 90 tablet; Refill: 0  2. Hypertension associated with type 2 diabetes mellitus (HCC) We will refill HCTZ 12.5 mg daily AND refill Metoprolol 25 mg for 3 months with 0 refills.  -Refill   hydrochlorothiazide (MICROZIDE) 12.5 MG capsule; Take 1 capsule (12.5 mg total) by mouth daily.  Dispense: 90 capsule; Refill: 1  -Refill  metoprolol succinate (TOPROL-XL) 25 MG 24 hr tablet; Take 1 tablet (25 mg total) by mouth daily.  Dispense: 90 tablet; Refill: 1  3. Vitamin D deficiency We will refill Vit D 50K IU once a week for 1 month with 0 refills.  - Refill Cholecalciferol 1.25 MG (50000 UT) capsule; Take 1 capsule (50,000 Units total) by mouth once a week.  Dispense: 4 capsule; Refill: 0  4. Anxiety and depression We will refill Zoloft 100 mg daily for 3 months with 0 refills.  - Refill sertraline (ZOLOFT) 100 MG tablet; Take 1 tablet (100 mg total) by mouth daily.  Dispense: 90 tablet; Refill: 0  5. Obesity with current BMI of 50.5 Isabella Guerrero is currently in the action stage of change. As such, her goal is to continue with weight loss efforts. She has agreed to the Category 4 Plan.   Exercise goals: All adults should avoid inactivity. Some physical activity is better than none, and adults who participate in any amount of physical activity gain some health benefits.  Behavioral modification strategies: increasing lean protein intake, meal planning and cooking strategies, keeping healthy foods in the home, and planning for success.  Isabella Guerrero has agreed to follow-up with our clinic in 4 weeks. She was informed of the importance of frequent follow-up visits to maximize her success with intensive lifestyle modifications for her multiple health conditions.   Objective:  Blood pressure (!) 140/83, pulse 89, temperature 98.7 F (37.1 C), height 5\' 6"  (1.676 m), weight (!) 310 lb (140.6 kg), SpO2 96 %. Body mass index is 50.04 kg/m.  General: Cooperative, alert, well developed, in no acute distress. HEENT: Conjunctivae and lids unremarkable. Cardiovascular: Regular rhythm.  Lungs: Normal work of breathing. Neurologic: No focal deficits.   Lab Results  Component Value Date    CREATININE 0.6 04/03/2022   BUN 16 04/03/2022   NA 136 (A) 04/03/2022   K 4.1 04/03/2022   CL 101 04/03/2022   CO2 27 (A) 04/03/2022   Lab Results  Component Value Date   ALT 12 04/03/2022   AST 12 (A) 04/03/2022   ALKPHOS 53 04/03/2022   BILITOT <0.2 09/09/2021   Lab Results  Component Value Date   HGBA1C 5.9 (H) 09/09/2021   HGBA1C 6.1 (H) 01/20/2021   HGBA1C 6.5 (H) 06/03/2020   Lab Results  Component Value Date   INSULIN 11.5 09/09/2021   INSULIN 15.5 01/20/2021   INSULIN 18.4 06/03/2020   Lab Results  Component Value Date   TSH 3.000 06/03/2020   Lab Results  Component Value Date   CHOL 240 (A) 04/03/2022   HDL 37 04/03/2022   LDLCALC 176 04/03/2022   TRIG 134 04/03/2022   Lab Results  Component Value Date   VD25OH 29.4 (L) 09/09/2021   VD25OH 26.7 (L) 01/20/2021   VD25OH 20.8 (L) 06/03/2020   Lab Results  Component Value Date   WBC 11.7 (H) 06/03/2020   HGB 13.5 06/03/2020   HCT 42.7 06/03/2020   MCV 85 06/03/2020   PLT 334 06/03/2020   No results found for: "IRON", "TIBC", "FERRITIN"  Attestation Statements:   Reviewed by clinician on day of visit: allergies, medications, problem list, medical history, surgical history, family history, social history, and previous encounter notes.  I, Elnora Morrison, RMA am acting as transcriptionist for Coralie Common, MD. I have reviewed the above documentation for accuracy and completeness, and I agree with the above. - Coralie Common, MD

## 2022-09-02 ENCOUNTER — Telehealth: Payer: Self-pay | Admitting: *Deleted

## 2022-09-02 NOTE — Telephone Encounter (Signed)
-----  Message from Sueanne Margarita, MD sent at 08/23/2022  8:22 PM EST ----- Please let patient know that they have sleep apnea.  Recommend therapeutic CPAP titration for treatment of patient's sleep disordered breathing.  If unable to perform an in lab titration then initiate ResMed auto CPAP from 4 to 15cm H2O with heated humidity and mask of choice and overnight pulse ox on CPAP.

## 2022-09-02 NOTE — Telephone Encounter (Signed)
Left message to return a call to discuss sleep study results and recommendations. 

## 2022-09-03 ENCOUNTER — Other Ambulatory Visit (HOSPITAL_COMMUNITY): Payer: Self-pay

## 2022-09-03 MED ORDER — PANTOPRAZOLE SODIUM 40 MG PO TBEC
40.0000 mg | DELAYED_RELEASE_TABLET | Freq: Every day | ORAL | 3 refills | Status: DC
  Filled 2022-09-03: qty 30, 30d supply, fill #0

## 2022-09-04 ENCOUNTER — Other Ambulatory Visit: Payer: Self-pay

## 2022-09-14 DIAGNOSIS — J309 Allergic rhinitis, unspecified: Secondary | ICD-10-CM | POA: Diagnosis not present

## 2022-09-14 DIAGNOSIS — J329 Chronic sinusitis, unspecified: Secondary | ICD-10-CM | POA: Diagnosis not present

## 2022-09-14 DIAGNOSIS — R000 Tachycardia, unspecified: Secondary | ICD-10-CM | POA: Diagnosis not present

## 2022-09-15 ENCOUNTER — Other Ambulatory Visit: Payer: Self-pay

## 2022-09-15 ENCOUNTER — Other Ambulatory Visit (HOSPITAL_COMMUNITY): Payer: Self-pay

## 2022-09-15 MED ORDER — METOPROLOL SUCCINATE ER 50 MG PO TB24
50.0000 mg | ORAL_TABLET | Freq: Every day | ORAL | 1 refills | Status: DC
  Filled 2022-09-15: qty 30, 30d supply, fill #0

## 2022-09-15 MED ORDER — LEVOCETIRIZINE DIHYDROCHLORIDE 5 MG PO TABS
5.0000 mg | ORAL_TABLET | Freq: Every day | ORAL | 3 refills | Status: DC
  Filled 2022-09-15: qty 30, 30d supply, fill #0

## 2022-09-17 ENCOUNTER — Ambulatory Visit (INDEPENDENT_AMBULATORY_CARE_PROVIDER_SITE_OTHER): Payer: BC Managed Care – PPO | Admitting: Family Medicine

## 2022-09-21 DIAGNOSIS — Z6841 Body Mass Index (BMI) 40.0 and over, adult: Secondary | ICD-10-CM | POA: Diagnosis not present

## 2022-09-21 DIAGNOSIS — S0990XA Unspecified injury of head, initial encounter: Secondary | ICD-10-CM | POA: Diagnosis not present

## 2022-09-23 ENCOUNTER — Other Ambulatory Visit (HOSPITAL_COMMUNITY): Payer: Self-pay

## 2022-09-23 ENCOUNTER — Encounter (INDEPENDENT_AMBULATORY_CARE_PROVIDER_SITE_OTHER): Payer: Self-pay | Admitting: Family Medicine

## 2022-09-23 ENCOUNTER — Ambulatory Visit (INDEPENDENT_AMBULATORY_CARE_PROVIDER_SITE_OTHER): Payer: BC Managed Care – PPO | Admitting: Family Medicine

## 2022-09-23 ENCOUNTER — Other Ambulatory Visit: Payer: Self-pay

## 2022-09-23 VITALS — BP 148/84 | HR 97 | Temp 98.9°F | Ht 66.0 in | Wt 315.0 lb

## 2022-09-23 DIAGNOSIS — Z6841 Body Mass Index (BMI) 40.0 and over, adult: Secondary | ICD-10-CM | POA: Diagnosis not present

## 2022-09-23 DIAGNOSIS — E559 Vitamin D deficiency, unspecified: Secondary | ICD-10-CM

## 2022-09-23 DIAGNOSIS — E669 Obesity, unspecified: Secondary | ICD-10-CM | POA: Diagnosis not present

## 2022-09-23 DIAGNOSIS — Z7985 Long-term (current) use of injectable non-insulin antidiabetic drugs: Secondary | ICD-10-CM

## 2022-09-23 DIAGNOSIS — E1165 Type 2 diabetes mellitus with hyperglycemia: Secondary | ICD-10-CM | POA: Diagnosis not present

## 2022-09-23 MED ORDER — SEMAGLUTIDE (1 MG/DOSE) 4 MG/3ML ~~LOC~~ SOPN
1.0000 mg | PEN_INJECTOR | SUBCUTANEOUS | 0 refills | Status: DC
  Filled 2022-09-23: qty 3, 28d supply, fill #0

## 2022-09-23 MED ORDER — CHOLECALCIFEROL 1.25 MG (50000 UT) PO CAPS
50000.0000 [IU] | ORAL_CAPSULE | ORAL | 0 refills | Status: DC
  Filled 2022-09-23: qty 4, 28d supply, fill #0
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
## 2022-09-24 ENCOUNTER — Other Ambulatory Visit: Payer: Self-pay

## 2022-09-25 NOTE — Telephone Encounter (Signed)
Left message for the patient to return a call to discuss sleep study results.

## 2022-09-28 DIAGNOSIS — G4733 Obstructive sleep apnea (adult) (pediatric): Secondary | ICD-10-CM | POA: Diagnosis not present
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
## 2022-10-08 ENCOUNTER — Telehealth: Payer: Self-pay | Admitting: *Deleted

## 2022-10-08 NOTE — Telephone Encounter (Signed)
Message left to return a call to discuss sleep study results and recommendations.

## 2022-10-08 NOTE — Telephone Encounter (Signed)
-----   Message from Sueanne Margarita, MD sent at 08/23/2022  8:22 PM EST ----- Please let patient know that they have sleep apnea.  Recommend therapeutic CPAP titration for treatment of patient's sleep disordered breathing.  If unable to perform an in lab titration then initiate ResMed auto CPAP from 4 to 15cm H2O with heated humidity and mask of choice and overnight pulse ox on CPAP.

## 2022-10-08 NOTE — Telephone Encounter (Signed)
-----   Message from Traci R Turner, MD sent at 08/23/2022  8:22 PM EST ----- Please let patient know that they have sleep apnea.  Recommend therapeutic CPAP titration for treatment of patient's sleep disordered breathing.  If unable to perform an in lab titration then initiate ResMed auto CPAP from 4 to 15cm H2O with heated humidity and mask of choice and overnight pulse ox on CPAP.   

## 2022-10-08 NOTE — Telephone Encounter (Signed)
Patient returned a call to me and first apologized for not returning my calls. She states that she had read the report in Mychart. She was ordered a CPAP machine by her PCP, Dr Serita Grammes. I asked if her PCP plans to manage her CPAP. She states that she has no idea. I explained to her that her therapy needs to be managed with following her AHI's and pressure adjustments when needed. She states that she was not aware of this and will speak with Dr Jeryl Columbia when she sees her.

## 2022-10-21 DIAGNOSIS — Z6791 Unspecified blood type, Rh negative: Secondary | ICD-10-CM | POA: Insufficient documentation

## 2022-10-21 DIAGNOSIS — O24419 Gestational diabetes mellitus in pregnancy, unspecified control: Secondary | ICD-10-CM

## 2022-10-21 HISTORY — DX: Unspecified blood type, rh negative: Z67.91

## 2022-10-21 HISTORY — DX: Gestational diabetes mellitus in pregnancy, unspecified control: O24.419

## 2022-10-21 HISTORY — DX: Morbid (severe) obesity due to excess calories: E66.01

## 2022-10-23 ENCOUNTER — Ambulatory Visit: Payer: BC Managed Care – PPO | Attending: Cardiology | Admitting: Cardiology

## 2022-10-23 ENCOUNTER — Encounter: Payer: Self-pay | Admitting: Cardiology

## 2022-10-23 VITALS — BP 144/80 | HR 111 | Ht 66.0 in | Wt 330.4 lb

## 2022-10-23 DIAGNOSIS — E785 Hyperlipidemia, unspecified: Secondary | ICD-10-CM

## 2022-10-23 DIAGNOSIS — F17200 Nicotine dependence, unspecified, uncomplicated: Secondary | ICD-10-CM

## 2022-10-23 DIAGNOSIS — I517 Cardiomegaly: Secondary | ICD-10-CM

## 2022-10-23 DIAGNOSIS — I10 Essential (primary) hypertension: Secondary | ICD-10-CM | POA: Diagnosis not present

## 2022-10-23 DIAGNOSIS — R0609 Other forms of dyspnea: Secondary | ICD-10-CM | POA: Diagnosis not present

## 2022-10-23 DIAGNOSIS — G4733 Obstructive sleep apnea (adult) (pediatric): Secondary | ICD-10-CM

## 2022-10-23 DIAGNOSIS — IMO0001 Reserved for inherently not codable concepts without codable children: Secondary | ICD-10-CM

## 2022-10-23 NOTE — Addendum Note (Signed)
Addended by: Truddie Hidden on: 10/23/2022 02:38 PM   Modules accepted: Orders

## 2022-10-23 NOTE — Patient Instructions (Signed)
Medication Instructions:  Your physician recommends that you continue on your current medications as directed. Please refer to the Current Medication list given to you today.  *If you need a refill on your cardiac medications before your next appointment, please call your pharmacy*   Lab Work: None ordered If you have labs (blood work) drawn today and your tests are completely normal, you will receive your results only by: MyChart Message (if you have MyChart) OR A paper copy in the mail If you have any lab test that is abnormal or we need to change your treatment, we will call you to review the results.   Testing/Procedures: None ordered   Follow-Up: At [facility], you and your health needs are our priority.  As part of our continuing mission to provide you with exceptional heart care, we have created designated Provider Care Teams.  These Care Teams include your primary Cardiologist (physician) and Advanced Practice Providers (APPs -  Physician Assistants and Nurse Practitioners) who all work together to provide you with the care you need, when you need it.  We recommend signing up for the patient portal called "MyChart".  Sign up information is provided on this After Visit Summary.  MyChart is used to connect with patients for Virtual Visits (Telemedicine).  Patients are able to view lab/test results, encounter notes, upcoming appointments, etc.  Non-urgent messages can be sent to your provider as well.   To learn more about what you can do with MyChart, go to [email or web address].    Your next appointment:   6 month(s)  The format for your next appointment:   In Person  Provider:   Robert Krasowski, MD    Other Instructions none  Important Information About Sugar       

## 2022-10-23 NOTE — Progress Notes (Signed)
Cardiology Office Note:    Date:  [id number]   ID:  Isabella Guerrero, DOB [id number], MRN [id number]  PCP:  Ronita Hipps, MD  Cardiologist:  Jenne Campus, MD    Referring MD: Ronita Hipps, MD   Chief Complaint  Patient presents with   HR elevated  Doing better but my heart rate still elevated  History of Present Illness:    Isabella Guerrero is a 32 y.o. female past medical history significant for smoking which is still ongoing, recently recognized diabetes, dyslipidemia, morbid obesity, recently recognized moderate obstructive sleep apnea.  She was referred to [place] because she had CT of her chest done which showed cardiomegaly.  Echocardiogram however showed normal left ventricle ejection fraction.  She is coming today for follow-up overall doing well.  She is thinking that her CPAP mask helping her still complain of having some tachycardia she was given metoprolol did have a chance to take it yet.  She does not drink much caffeinated drink maybe 1 coffee a day otherwise drinks only water.  Past Medical History:  Diagnosis Date   Asthma    Back pain    BRBPR (bright red blood per rectum)    Breech presentation 03/01/2019   Cardiomegaly 07/22/2022   Carpal tunnel syndrome of right wrist 08/05/2020   Constipation    De Quervain's tenosynovitis, right 08/05/2020   Diabetes mellitus ([place]) 07/01/2020   Dyslipidemia 07/22/2022   Dyspnea on exertion 07/22/2022   Fatigue    Foot pain    Gastroesophageal reflux disease    GDM (gestational diabetes mellitus) 12/2018   GERD (gastroesophageal reflux disease)    Gestational diabetes    Gestational diabetes mellitus 10/21/2022   diet   Hypertension    Irregular periods 07/02/2020   Joint pain    Knee pain    Late period 07/13/2018   Leukocytosis 03/30/2018   Lower back pain    Morbid obesity ([place]) 10/21/2022   BMI 55 Growth [place] at 32 weeks here   Obesity    Obstructive sleep apnea 07/22/2022   Palpitations    Prediabetes     Pregnancy 08/24/2018   RhD negative 10/21/2022   Rhogam at 28 weeks   S/P primary low transverse C-section 03/01/2019   Scapular dyskinesis 09/24/2020   Shortness of breath    Shortness of breath on exertion    Sleep apnea    Smoking 07/22/2022   Vitamin D deficiency 07/01/2020    Past Surgical History:  Procedure Laterality Date   BIOPSY  09/16/2020   Procedure: BIOPSY;  Surgeon: Jackquline Denmark, MD;  Location: [facility];  Service: Endoscopy;;  EGD and COLON   CESAREAN SECTION N/A 03/01/2019   Procedure: CESAREAN SECTION;  Surgeon: Paula Compton, MD;  Location: [facility];  Service: Obstetrics;  Laterality: N/A;  need extra 11mins in OR and request RNFA   CHOLECYSTECTOMY  06/04/2022   COLONOSCOPY WITH PROPOFOL N/A 09/16/2020   Procedure: COLONOSCOPY WITH PROPOFOL;  Surgeon: Jackquline Denmark, MD;  Location: [facility];  Service: Endoscopy;  Laterality: N/A;   DRUG INDUCED ENDOSCOPY     ESOPHAGOGASTRODUODENOSCOPY  12/28/2011   Mild gastritis. Otherwise normal EGD   ESOPHAGOGASTRODUODENOSCOPY (EGD) WITH PROPOFOL N/A 09/16/2020   Procedure: ESOPHAGOGASTRODUODENOSCOPY (EGD) WITH PROPOFOL;  Surgeon: Jackquline Denmark, MD;  Location: [facility];  Service: Endoscopy;  Laterality: N/A;   FINGER SURGERY     POLYPECTOMY  09/16/2020   Procedure: POLYPECTOMY;  Surgeon: Jackquline Denmark, MD;  Location: [facility];  Service:  Endoscopy;;   TONSILLECTOMY     WISDOM TOOTH EXTRACTION      Current Medications: Current Meds  Medication Sig   aspirin EC 81 MG tablet Take 1 tablet (81 mg total) by mouth daily. Swallow whole.   atorvastatin (LIPITOR) 10 MG tablet Take 1 tablet (10 mg total) by mouth daily.   busPIRone (BUSPAR) 10 MG tablet Take 10 mg by mouth daily.   Cholecalciferol 1.25 MG (50000 UT) capsule Take 1 capsule (50,000 Units total) by mouth once a week.   etonogestrel (NEXPLANON) 68 MG IMPL implant 1 each by Subdermal route once. In Left Arm   hydrochlorothiazide (MICROZIDE) 12.5 MG  capsule Take 1 capsule (12.5 mg total) by mouth daily.   levocetirizine (XYZAL) 5 MG tablet Take 1 tablet (5 mg total) by mouth daily.   metoprolol succinate (TOPROL-XL) 50 MG 24 hr tablet Take 1 tablet (50 mg total) by mouth daily.   omeprazole (PRILOSEC) 40 MG capsule Take 40 mg by mouth daily.   pantoprazole (PROTONIX) 40 MG tablet Take 1 tablet (40 mg total) by mouth daily.   Semaglutide, 1 MG/DOSE, 4 MG/3ML SOPN Inject 1 mg as directed once a week.   sertraline (ZOLOFT) 100 MG tablet Take 1 tablet (100 mg total) by mouth daily.     Allergies:   Scallops [shellfish allergy], Nsaids, Latex, Lidocaine, and Tape   Social History   Socioeconomic History   Marital status: Single    Spouse name: Not on file   Number of children: Not on file   Years of education: Not on file   Highest education level: Not on file  Occupational History   Occupation: homemaker  Tobacco Use   Smoking status: Every Day    Packs/day: .25    Types: Cigarettes    Last attempt to quit: 07/08/2018    Years since quitting: 4.2   Smokeless tobacco: Never   Tobacco comments:    Currently taking medication to assist in cessation  Vaping Use   Vaping Use: Never used  Substance and Sexual Activity   Alcohol use: Not Currently    Alcohol/week: 1.0 standard drink of alcohol    Types: 1 Cans of beer per week    Comment: once in a while   Drug use: Never   Sexual activity: Yes    Partners: Male    Birth control/protection: Implant  Other Topics Concern   Not on file  Social History Narrative   Not on file   Social Determinants of Health   Financial Resource Strain: Low Risk  (02/20/2019)   Overall Financial Resource Strain (CARDIA)    Difficulty of Paying Living Expenses: Not hard at all  Food Insecurity: No Food Insecurity (02/20/2019)   Hunger Vital Sign    Worried About Running Out of Food in the Last Year: Never true    [place] in the Last Year: Never true  Transportation Needs: Unknown  (02/20/2019)   PRAPARE - Hydrologist (Medical): No    Lack of Transportation (Non-Medical): Not on file  Physical Activity: Not on file  Stress: Stress Concern Present (02/20/2019)   [place]    Feeling of Stress : To some extent  Social Connections: Not on file     Family History: The patient's family history includes Cancer in her father; Colon cancer in her father and maternal grandmother; Diabetes in her father and mother; Esophageal cancer in her  maternal uncle; Heart disease in her father and mother; Hyperlipidemia in her father and mother; Hypertension in her father and mother; Obesity in her father and mother; Sleep apnea in her father; Thyroid disease in her mother. ROS:   Please see the history of present illness.    All 14 point review of systems negative except as described per history of present illness  EKGs/Labs/Other Studies Reviewed:      Recent Labs: 04/03/2022: ALT 12; BUN 16; Creatinine 0.6; Potassium 4.1; Sodium 136  Recent Lipid Panel    Component Value Date/Time   CHOL 240 (A) 04/03/2022 0000   CHOL 222 (H) 09/09/2021 0825   TRIG 134 04/03/2022 0000   HDL 37 04/03/2022 0000   HDL 35 (L) 09/09/2021 0825   LDLCALC 176 04/03/2022 0000   LDLCALC 163 (H) 09/09/2021 0825    Physical Exam:    VS:  BP (!) 144/80 (BP Location: Left Arm, Patient Position: Sitting)   Pulse (!) 111   Ht 5\' 6"  (1.676 m)   Wt (!) 330 lb 6.4 oz (149.9 kg)   SpO2 97%   BMI 53.33 kg/m     Wt Readings from Last 3 Encounters:  10/23/22 (!) 330 lb 6.4 oz (149.9 kg)  09/23/22 (!) 315 lb (142.9 kg)  08/20/22 (!) 310 lb (140.6 kg)     GEN:  Well nourished, well developed in no acute distress HEENT: Normal NECK: No JVD; No carotid bruits LYMPHATICS: No lymphadenopathy CARDIAC: RRR, no murmurs, no rubs, no gallops RESPIRATORY:  Clear to auscultation without rales, wheezing or rhonchi   ABDOMEN: Soft, non-tender, non-distended MUSCULOSKELETAL:  No edema; No deformity  SKIN: Warm and dry LOWER EXTREMITIES: no swelling NEUROLOGIC:  Alert and oriented x 3 PSYCHIATRIC:  Normal affect   ASSESSMENT:    1. Cardiomegaly   2. Primary hypertension   3. Obstructive sleep apnea   4. Dyspnea on exertion   5. Morbid obesity ([place])   6. Smoking    PLAN:    In order of problems listed above:  Cardiomegaly echocardiogram showed normal size and function.  Will drop the diagnosis. Essential hypertension blood pressure elevated today still uncontrolled however she was given metoprolol succinate 50 mg daily did not have a chance to take it yet.  I asked her to start taking the medication. Obstructive sleep apnea she is using CPAP mask which seems to be helping and very happy for her. Morbid obesity we talk about need to exercise on the regular basis, need to lose some weight she understands she will try to do it. Smoking long discussion about this I gave her all different things that she can use to try to quit.  We spent at least 5 minutes talking about it. Tachycardia and I asked her to start exercising on the regular basis echocardiogram showed normal ejection fraction her TSH was normal previously.  Will continue monitoring.   Medication Adjustments/Labs and Tests Ordered: Current medicines are reviewed at length with the patient today.  Concerns regarding medicines are outlined above.  No orders of the defined types were placed in this encounter.  Medication changes: No orders of the defined types were placed in this encounter.   Signed, Park Liter, MD, [facility] 10/23/2022 2:01 PM    [facility]

## 2022-10-27 DIAGNOSIS — G4733 Obstructive sleep apnea (adult) (pediatric): Secondary | ICD-10-CM | POA: Diagnosis not present

## 2022-10-29 ENCOUNTER — Ambulatory Visit (INDEPENDENT_AMBULATORY_CARE_PROVIDER_SITE_OTHER): Payer: BC Managed Care – PPO | Admitting: Family Medicine

## 2022-11-03 ENCOUNTER — Other Ambulatory Visit: Payer: Self-pay

## 2022-11-03 ENCOUNTER — Other Ambulatory Visit (HOSPITAL_COMMUNITY): Payer: Self-pay

## 2022-11-03 DIAGNOSIS — G4700 Insomnia, unspecified: Secondary | ICD-10-CM | POA: Diagnosis not present

## 2022-11-03 DIAGNOSIS — Z6841 Body Mass Index (BMI) 40.0 and over, adult: Secondary | ICD-10-CM | POA: Diagnosis not present

## 2022-11-03 DIAGNOSIS — G4733 Obstructive sleep apnea (adult) (pediatric): Secondary | ICD-10-CM | POA: Diagnosis not present

## 2022-11-03 MED ORDER — TRAZODONE HCL 100 MG PO TABS
100.0000 mg | ORAL_TABLET | Freq: Every evening | ORAL | 1 refills | Status: DC
  Filled 2022-11-03: qty 30, 30d supply, fill #0

## 2022-11-23 ENCOUNTER — Encounter: Payer: Self-pay | Admitting: *Deleted

## 2022-11-23 DIAGNOSIS — T7840XA Allergy, unspecified, initial encounter: Secondary | ICD-10-CM | POA: Diagnosis not present

## 2022-11-23 DIAGNOSIS — L508 Other urticaria: Secondary | ICD-10-CM | POA: Diagnosis not present

## 2022-11-23 DIAGNOSIS — R062 Wheezing: Secondary | ICD-10-CM | POA: Diagnosis not present

## 2022-11-27 DIAGNOSIS — G4733 Obstructive sleep apnea (adult) (pediatric): Secondary | ICD-10-CM | POA: Diagnosis not present

## 2022-12-08 ENCOUNTER — Other Ambulatory Visit: Payer: Self-pay

## 2022-12-08 ENCOUNTER — Other Ambulatory Visit (HOSPITAL_COMMUNITY): Payer: Self-pay

## 2022-12-08 MED ORDER — METOPROLOL SUCCINATE ER 50 MG PO TB24
50.0000 mg | ORAL_TABLET | Freq: Every day | ORAL | 1 refills | Status: DC
  Filled 2022-12-08: qty 30, 30d supply, fill #0

## 2022-12-08 MED ORDER — ATORVASTATIN CALCIUM 10 MG PO TABS
10.0000 mg | ORAL_TABLET | Freq: Every day | ORAL | 0 refills | Status: DC
  Filled 2022-12-08: qty 30, 30d supply, fill #0

## 2022-12-17 DIAGNOSIS — Z6841 Body Mass Index (BMI) 40.0 and over, adult: Secondary | ICD-10-CM | POA: Diagnosis not present

## 2022-12-17 DIAGNOSIS — L0291 Cutaneous abscess, unspecified: Secondary | ICD-10-CM | POA: Diagnosis not present

## 2022-12-27 DIAGNOSIS — G4733 Obstructive sleep apnea (adult) (pediatric): Secondary | ICD-10-CM | POA: Diagnosis not present

## 2022-12-29 ENCOUNTER — Ambulatory Visit (INDEPENDENT_AMBULATORY_CARE_PROVIDER_SITE_OTHER): Payer: BC Managed Care – PPO | Admitting: Allergy

## 2022-12-29 ENCOUNTER — Encounter: Payer: Self-pay | Admitting: Allergy

## 2022-12-29 VITALS — BP 110/84 | HR 102 | Resp 18 | Ht 66.0 in | Wt 329.0 lb

## 2022-12-29 DIAGNOSIS — L508 Other urticaria: Secondary | ICD-10-CM

## 2022-12-29 DIAGNOSIS — J310 Chronic rhinitis: Secondary | ICD-10-CM | POA: Diagnosis not present

## 2022-12-29 DIAGNOSIS — T783XXD Angioneurotic edema, subsequent encounter: Secondary | ICD-10-CM

## 2022-12-29 MED ORDER — LEVOCETIRIZINE DIHYDROCHLORIDE 5 MG PO TABS: ORAL_TABLET | ORAL | 5 refills | Status: AC

## 2022-12-29 NOTE — Progress Notes (Signed)
New Patient Note  RE: Isabella Guerrero MRN: [id number] DOB: 1991-01-06 Date of Office Visit: 12/29/2022   Primary care provider: Buckner Malta, MD  Chief Complaint: hives  History of present illness: Isabella Guerrero is a 32 y.o. female presenting today for evaluation of hives.   She has been having hives that come and go. 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  She got home and noted the hives and she also had lip swelling that progressed into facial swelling.  She also started to feel like her throat was closing and she was having chest pain so she went to ED at that time.  She is not sure what triggered this episode.    She states she was fine without hives/swelling for years until this year in the end of February.  She is having hives now more when she gets hot and occurs at least weekly.  The hives are itchy, large welts and are red.  She has had puffiness of the eyes.   No fevers, no joint aches/pain.  Hive can last less than 24 hours and are not leaving behind any bruising.  She has not identified any triggers.  No preceding illnesses, no new foods, no change in soaps/detergents, no bites or stings.  She has been taking xyzal 1 tab daily and she was prescribed hydroxyzine as needed.   For allergy symptom control she has taken claritin and allegra before.  She used to take ranitidine with antihistamine in 2016 for her hives.  This year she has taken pepcid as needed.  She uses flonase as needed for congestion.  She does have a epipen.  She has seen her PCP for this and was treated with prednisone taper pack which helped somewhat.     Review of systems in the past 4 weeks: Review of Systems  Constitutional: Negative.   HENT: Negative.    Eyes: Negative.   Respiratory: Negative.    Cardiovascular: Negative.   Gastrointestinal: Negative.   Musculoskeletal: Negative.    Skin:  Positive for rash.  Allergic/Immunologic: Negative.   Neurological: Negative.     All other systems negative unless noted above in HPI  Past medical history: Past Medical History:  Diagnosis Date   Asthma    Back pain    BRBPR (bright red blood per rectum)    Breech presentation 03/01/2019   Cardiomegaly 07/22/2022   Carpal tunnel syndrome of right wrist 08/05/2020   Constipation    De Quervain's tenosynovitis, right 08/05/2020   Diabetes mellitus ([facility]) 07/01/2020   Dyslipidemia 07/22/2022   Dyspnea on exertion 07/22/2022   Fatigue    Foot pain    Gastroesophageal reflux disease    GDM (gestational diabetes mellitus) 12/2018   GERD (gastroesophageal reflux disease)    Gestational diabetes    Gestational diabetes mellitus 10/21/2022   diet   Hypertension    Irregular periods 07/02/2020   Joint pain    Knee pain    Late period 07/13/2018   Leukocytosis 03/30/2018   Lower back pain    Morbid obesity ([facility]) 10/21/2022   BMI 55 Growth [place] at 32 weeks here   Obesity    Obstructive sleep apnea 07/22/2022   Palpitations    Prediabetes    Pregnancy 08/24/2018   RhD negative 10/21/2022   Rhogam at 28 weeks   S/P primary  low transverse C-section 03/01/2019   Scapular dyskinesis 09/24/2020   Shortness of breath    Shortness of breath on exertion    Sleep apnea    Smoking 07/22/2022   Vitamin D deficiency 07/01/2020    Past surgical history: Past Surgical History:  Procedure Laterality Date   BIOPSY  09/16/2020   Procedure: BIOPSY;  Surgeon: Lynann Bologna, MD;  Location: [facility];  Service: Endoscopy;;  EGD and COLON   CESAREAN SECTION N/A 03/01/2019   Procedure: CESAREAN SECTION;  Surgeon: Huel Cote, MD;  Location: [facility];  Service: Obstetrics;  Laterality: N/A;  need extra in OR and request RNFA   CHOLECYSTECTOMY  06/04/2022   COLONOSCOPY WITH PROPOFOL N/A 09/16/2020   Procedure: COLONOSCOPY WITH PROPOFOL;  Surgeon: Lynann Bologna, MD;   Location: [facility];  Service: Endoscopy;  Laterality: N/A;   DRUG INDUCED ENDOSCOPY     ESOPHAGOGASTRODUODENOSCOPY  12/28/2011   Mild gastritis. Otherwise normal EGD   ESOPHAGOGASTRODUODENOSCOPY (EGD) WITH PROPOFOL N/A 09/16/2020   Procedure: ESOPHAGOGASTRODUODENOSCOPY (EGD) WITH PROPOFOL;  Surgeon: Lynann Bologna, MD;  Location: [facility];  Service: Endoscopy;  Laterality: N/A;   FINGER SURGERY     POLYPECTOMY  09/16/2020   Procedure: POLYPECTOMY;  Surgeon: Lynann Bologna, MD;  Location: [facility];  Service: Endoscopy;;   TONSILLECTOMY     WISDOM TOOTH EXTRACTION      Family history:  Family History  Problem Relation Age of Onset   Diabetes Mother    Hypertension Mother    Heart disease Mother    Hyperlipidemia Mother    Thyroid disease Mother    Obesity Mother    Hypertension Father    Diabetes Father    Hyperlipidemia Father    Heart disease Father    Cancer Father    Sleep apnea Father    Obesity Father    Colon cancer Father        stage 4    Esophageal cancer Maternal Uncle    Colon cancer Maternal Grandmother     Social history: She lives in a home without carpeting with electric heating and central cooling.  Dog in the home.  Cats outside the home.  There is concern for water damage or mildew in the home.  No concern for roaches in the home.  She is a Water quality scientist. Tobacco Use   Smoking status: Every Day    Packs/day: .25    Types: Cigarettes    Last attempt to quit: 07/08/2018    Years since quitting: 4.4   Smokeless tobacco: Never   Tobacco comments:  Vaping Use   Vaping Use: Never used   Medication List: Current Outpatient Medications  Medication Sig Dispense Refill   aspirin EC 81 MG tablet Take 1 tablet (81 mg total) by mouth daily. Swallow whole. 90 tablet 3   atorvastatin (LIPITOR) 10 MG tablet Take 1 tablet (10 mg total) by mouth daily. 30 tablet 0   busPIRone (BUSPAR) 10 MG tablet Take 10 mg by mouth daily.     Cholecalciferol 1.25 MG  (50000 UT) capsule Take 1 capsule (50,000 Units total) by mouth once a week. 4 capsule 0   EPINEPHrine 0.3 mg/0.3 mL IJ SOAJ injection Inject into the muscle.     etonogestrel (NEXPLANON) 68 MG IMPL implant 1 each by Subdermal route once. In Left Arm     hydrochlorothiazide (MICROZIDE) 12.5 MG capsule Take 1 capsule (12.5 mg total) by mouth daily. 90 capsule 1   hydrOXYzine (ATARAX) 25 MG tablet  Take 25 mg by mouth daily.     levocetirizine (XYZAL) 5 MG tablet Take 1 tablet (5 mg total) by mouth daily. 30 tablet 3   metoprolol succinate (TOPROL-XL) 50 MG 24 hr tablet Take 1 tablet (50 mg total) by mouth daily. 30 tablet 1   pantoprazole (PROTONIX) 40 MG tablet Take 1 tablet (40 mg total) by mouth daily. 30 tablet 3   Semaglutide, 1 MG/DOSE, 4 MG/3ML SOPN Inject 1 mg as directed once a week. 3 mL 0   sertraline (ZOLOFT) 100 MG tablet Take 1 tablet (100 mg total) by mouth daily. 90 tablet 0   No current facility-administered medications for this visit.    Known medication allergies: Allergies  Allergen Reactions   Nsaids Other (See Comments)    GI Bleed   Latex Rash   Lidocaine Other (See Comments)    Pt reports feeling abnormal, feeling faint   Tape Rash    Or other adhesives      Physical examination: Blood pressure 110/84, pulse (!) 102, resp. rate 18, height 5\' 6"  (1.676 m), weight (!) 329 lb (149.2 kg), SpO2 95 %.  General: Alert, interactive, in no acute distress. HEENT: PERRLA, TMs pearly gray, turbinates non-edematous without discharge, post-pharynx non erythematous. Neck: Supple without lymphadenopathy. Lungs: Clear to auscultation without wheezing, rhonchi or rales. {no increased work of breathing. CV: Normal S1, S2 without murmurs. Abdomen: Nondistended, nontender. Skin: Warm and dry, without lesions or rashes. Extremities:  No clubbing, cyanosis or edema. Neuro:   Grossly intact.  Diagnositics/Labs: None today due to ongoing urticaria  Assessment and plan:    Chronic urticaria with angioedema Rhinitis   - at this time etiology of hives and swelling is unknown at this time.  Hives can be caused by a variety of different triggers including illness/infection, foods, medications, stings, exercise, pressure, vibrations, extremes of temperature to name a few however majority of the time there is no identifiable trigger.  Your symptoms have been ongoing for >6 weeks making this chronic thus will obtain labwork to evaluate: CBC w diff, CMP, tryptase, hive panel, environmental panel, alpha-gal panel, inflammatory markers  - for hive control recommend high dose antihistamine regimen: Xyzal 5mg  1 tab twice a day WITH Pepcid 20mg  1 tab twice a day  - if twice a day dosing is not effective in controlling hive then let us know and would recommend proceeding with Xolair monthly injections for chronic spontaneous hive management  - reserve Hydroxyzine 25mg  as needed use at bedtime for breakthrough symptoms  Follow-up in 2-3 months or sooner if needed  I appreciate the opportunity to take part in Deliana's care. Please do not hesitate to contact me with questions.  Sincerely,   Margo Aye, MD Allergy/Immunology [facility]

## 2022-12-29 NOTE — Patient Instructions (Addendum)
Chronic hives and swelling  - at this time etiology of hives and swelling is unknown at this time.  Hives can be caused by a variety of different triggers including illness/infection, foods, medications, stings, exercise, pressure, vibrations, extremes of temperature to name a few however majority of the time there is no identifiable trigger.  Your symptoms have been ongoing for >6 weeks making this chronic thus will obtain labwork to evaluate: CBC w diff, CMP, tryptase, hive panel, environmental panel, alpha-gal panel, inflammatory markers  - for hive control recommend high dose antihistamine regimen: Xyzal 5mg  1 tab twice a day WITH Pepcid 20mg  1 tab twice a day  - if twice a day dosing is not effective in controlling hive then let us know and would recommend proceeding with Xolair monthly injections for chronic spontaneous hive management  - reserve Hydroxyzine 25mg  as needed use at bedtime for breakthrough symptoms  Follow-up in 2-3 months or sooner if needed

## 2023-01-12 ENCOUNTER — Telehealth: Payer: Self-pay | Admitting: Allergy

## 2023-01-12 NOTE — Telephone Encounter (Signed)
Left a detailed message per DPR.  °

## 2023-01-12 NOTE — Telephone Encounter (Signed)
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   Provide with avoidance measures - alpha gal panel is negative thus does not have red meat allergy -CBC is largely within normal limits.  He does show a slight elevation in the white blood cell count which are the cells that help fight off infection.  This could be slightly elevated if she recently had any systemic steroids like prednisone or steroid injection or if she recently had an illness. -CMP only remarkable for elevated glucose level however this may not be a fasting study (for my purposes did not need to be fasting) -Inflammatory marker is elevated -Thyroid studies are normal -Tryptase level is normal thus does not have "hyperactive" allergy cells  Hive workup largely reassuring and most likely indicates spontaneous nature of her hives.  She does have a physical component with heat worsening hives.  Labs to be scanned into EMR.

## 2023-01-14 DIAGNOSIS — Z6841 Body Mass Index (BMI) 40.0 and over, adult: Secondary | ICD-10-CM | POA: Diagnosis not present

## 2023-01-14 DIAGNOSIS — R509 Fever, unspecified: Secondary | ICD-10-CM | POA: Diagnosis not present

## 2023-01-14 DIAGNOSIS — J40 Bronchitis, not specified as acute or chronic: Secondary | ICD-10-CM | POA: Diagnosis not present

## 2023-01-14 DIAGNOSIS — J329 Chronic sinusitis, unspecified: Secondary | ICD-10-CM | POA: Diagnosis not present

## 2023-01-18 ENCOUNTER — Other Ambulatory Visit: Payer: Self-pay

## 2023-01-27 DIAGNOSIS — G4733 Obstructive sleep apnea (adult) (pediatric): Secondary | ICD-10-CM | POA: Diagnosis not present

## 2023-02-01 ENCOUNTER — Other Ambulatory Visit (HOSPITAL_COMMUNITY): Payer: Self-pay

## 2023-02-01 ENCOUNTER — Other Ambulatory Visit: Payer: Self-pay

## 2023-02-01 MED ORDER — PANTOPRAZOLE SODIUM 40 MG PO TBEC
40.0000 mg | DELAYED_RELEASE_TABLET | Freq: Every day | ORAL | 3 refills | Status: DC
  Filled 2023-02-01: qty 30, 30d supply, fill #0

## 2023-02-01 MED ORDER — HYDROXYZINE HCL 25 MG PO TABS
25.0000 mg | ORAL_TABLET | Freq: Every evening | ORAL | 1 refills | Status: AC | PRN
  Filled 2023-02-01: qty 30, 30d supply, fill #0

## 2023-02-01 MED ORDER — METOPROLOL SUCCINATE ER 50 MG PO TB24
50.0000 mg | ORAL_TABLET | Freq: Every day | ORAL | 1 refills | Status: DC
  Filled 2023-02-01: qty 30, 30d supply, fill #0

## 2023-02-04 DIAGNOSIS — Z6841 Body Mass Index (BMI) 40.0 and over, adult: Secondary | ICD-10-CM | POA: Diagnosis not present

## 2023-02-04 DIAGNOSIS — E559 Vitamin D deficiency, unspecified: Secondary | ICD-10-CM | POA: Diagnosis not present

## 2023-02-04 DIAGNOSIS — Z13220 Encounter for screening for lipoid disorders: Secondary | ICD-10-CM | POA: Diagnosis not present

## 2023-02-04 DIAGNOSIS — Z01419 Encounter for gynecological examination (general) (routine) without abnormal findings: Secondary | ICD-10-CM | POA: Diagnosis not present

## 2023-02-04 DIAGNOSIS — Z131 Encounter for screening for diabetes mellitus: Secondary | ICD-10-CM | POA: Diagnosis not present

## 2023-02-05 DIAGNOSIS — Z01419 Encounter for gynecological examination (general) (routine) without abnormal findings: Secondary | ICD-10-CM | POA: Diagnosis not present

## 2023-02-09 LAB — HM PAP SMEAR: HPV, high-risk: NEGATIVE

## 2023-02-22 ENCOUNTER — Other Ambulatory Visit (HOSPITAL_COMMUNITY): Payer: Self-pay

## 2023-02-22 MED ORDER — HYDROCHLOROTHIAZIDE 12.5 MG PO CAPS
12.5000 mg | ORAL_CAPSULE | Freq: Every day | ORAL | 1 refills | Status: AC
  Filled 2023-02-22: qty 90, 90d supply, fill #0

## 2023-03-02 DIAGNOSIS — T783XXD Angioneurotic edema, subsequent encounter: Secondary | ICD-10-CM | POA: Diagnosis not present

## 2023-03-16 DIAGNOSIS — S8991XA Unspecified injury of right lower leg, initial encounter: Secondary | ICD-10-CM | POA: Diagnosis not present

## 2023-03-16 DIAGNOSIS — M542 Cervicalgia: Secondary | ICD-10-CM | POA: Diagnosis not present

## 2023-03-16 DIAGNOSIS — Z6841 Body Mass Index (BMI) 40.0 and over, adult: Secondary | ICD-10-CM | POA: Diagnosis not present

## 2023-03-19 ENCOUNTER — Other Ambulatory Visit (HOSPITAL_COMMUNITY): Payer: Self-pay

## 2023-03-19 MED ORDER — CYCLOBENZAPRINE HCL 5 MG PO TABS
5.0000 mg | ORAL_TABLET | Freq: Every evening | ORAL | 1 refills | Status: AC | PRN
  Filled 2023-03-19: qty 30, 30d supply, fill #0

## 2023-03-23 ENCOUNTER — Other Ambulatory Visit: Payer: Self-pay

## 2023-03-23 ENCOUNTER — Other Ambulatory Visit (HOSPITAL_COMMUNITY): Payer: Self-pay

## 2023-03-23 MED ORDER — BUSPIRONE HCL 10 MG PO TABS
10.0000 mg | ORAL_TABLET | Freq: Three times a day (TID) | ORAL | 1 refills | Status: DC | PRN
  Filled 2023-03-23: qty 90, 30d supply, fill #0
  Filled 2023-03-23: qty 90, 23d supply, fill #0

## 2023-03-23 MED ORDER — METOPROLOL SUCCINATE ER 50 MG PO TB24
50.0000 mg | ORAL_TABLET | Freq: Every day | ORAL | 1 refills | Status: DC
  Filled 2023-03-23: qty 90, 90d supply, fill #0

## 2023-03-23 MED ORDER — PANTOPRAZOLE SODIUM 40 MG PO TBEC
40.0000 mg | DELAYED_RELEASE_TABLET | Freq: Every day | ORAL | 2 refills | Status: DC
  Filled 2023-03-23: qty 90, 90d supply, fill #0

## 2023-03-23 MED ORDER — ATORVASTATIN CALCIUM 10 MG PO TABS
10.0000 mg | ORAL_TABLET | Freq: Every day | ORAL | 1 refills | Status: DC
  Filled 2023-03-23: qty 90, 90d supply, fill #0

## 2023-03-26 DIAGNOSIS — Z6841 Body Mass Index (BMI) 40.0 and over, adult: Secondary | ICD-10-CM | POA: Diagnosis not present

## 2023-03-26 DIAGNOSIS — M25561 Pain in right knee: Secondary | ICD-10-CM | POA: Diagnosis not present

## 2023-04-29 DIAGNOSIS — H9203 Otalgia, bilateral: Secondary | ICD-10-CM | POA: Diagnosis not present

## 2023-04-29 DIAGNOSIS — R0981 Nasal congestion: Secondary | ICD-10-CM | POA: Diagnosis not present

## 2023-04-29 DIAGNOSIS — Z6841 Body Mass Index (BMI) 40.0 and over, adult: Secondary | ICD-10-CM | POA: Diagnosis not present

## 2023-05-06 DIAGNOSIS — D729 Disorder of white blood cells, unspecified: Secondary | ICD-10-CM | POA: Diagnosis not present

## 2023-05-06 DIAGNOSIS — H65193 Other acute nonsuppurative otitis media, bilateral: Secondary | ICD-10-CM | POA: Diagnosis not present

## 2023-05-25 DIAGNOSIS — Z6841 Body Mass Index (BMI) 40.0 and over, adult: Secondary | ICD-10-CM | POA: Diagnosis not present

## 2023-05-25 DIAGNOSIS — E1165 Type 2 diabetes mellitus with hyperglycemia: Secondary | ICD-10-CM | POA: Diagnosis not present

## 2023-05-25 DIAGNOSIS — J0140 Acute pansinusitis, unspecified: Secondary | ICD-10-CM | POA: Diagnosis not present

## 2023-05-25 LAB — LAB REPORT - SCANNED: A1c: 7

## 2023-05-26 DIAGNOSIS — L989 Disorder of the skin and subcutaneous tissue, unspecified: Secondary | ICD-10-CM | POA: Diagnosis not present

## 2023-05-26 DIAGNOSIS — R519 Headache, unspecified: Secondary | ICD-10-CM | POA: Diagnosis not present

## 2023-06-15 ENCOUNTER — Encounter (INDEPENDENT_AMBULATORY_CARE_PROVIDER_SITE_OTHER): Payer: Self-pay

## 2023-06-17 ENCOUNTER — Telehealth (INDEPENDENT_AMBULATORY_CARE_PROVIDER_SITE_OTHER): Payer: Self-pay | Admitting: Otolaryngology

## 2023-06-17 NOTE — Telephone Encounter (Signed)
Called patient to r/s appt on 07/01/23.  Dr. Allena Katz has a surgery and appt will need to be moved.  Left vmail msg for patient to call our office.

## 2023-06-23 ENCOUNTER — Encounter (INDEPENDENT_AMBULATORY_CARE_PROVIDER_SITE_OTHER): Payer: Self-pay

## 2023-07-01 ENCOUNTER — Institutional Professional Consult (permissible substitution) (INDEPENDENT_AMBULATORY_CARE_PROVIDER_SITE_OTHER): Payer: BC Managed Care – PPO

## 2023-07-12 ENCOUNTER — Other Ambulatory Visit (HOSPITAL_COMMUNITY): Payer: Self-pay

## 2023-07-12 MED ORDER — BUSPIRONE HCL 10 MG PO TABS
10.0000 mg | ORAL_TABLET | Freq: Three times a day (TID) | ORAL | 1 refills | Status: AC | PRN
  Filled 2023-07-12: qty 180, 60d supply, fill #0

## 2023-07-13 ENCOUNTER — Other Ambulatory Visit (HOSPITAL_COMMUNITY): Payer: Self-pay

## 2023-07-14 ENCOUNTER — Other Ambulatory Visit (HOSPITAL_COMMUNITY): Payer: Self-pay

## 2023-07-14 MED ORDER — RYBELSUS 3 MG PO TABS
3.0000 mg | ORAL_TABLET | Freq: Every day | ORAL | 0 refills | Status: AC
  Filled 2023-07-14 – 2023-08-06 (×2): qty 30, 30d supply, fill #0

## 2023-07-15 ENCOUNTER — Other Ambulatory Visit (HOSPITAL_COMMUNITY): Payer: Self-pay

## 2023-07-16 ENCOUNTER — Other Ambulatory Visit: Payer: Self-pay

## 2023-07-16 ENCOUNTER — Other Ambulatory Visit (HOSPITAL_COMMUNITY): Payer: Self-pay

## 2023-07-16 MED ORDER — ATORVASTATIN CALCIUM 10 MG PO TABS
10.0000 mg | ORAL_TABLET | Freq: Every day | ORAL | 1 refills | Status: AC
  Filled 2023-07-16: qty 90, 90d supply, fill #0

## 2023-07-16 MED ORDER — PANTOPRAZOLE SODIUM 40 MG PO TBEC
40.0000 mg | DELAYED_RELEASE_TABLET | Freq: Every day | ORAL | 2 refills | Status: AC
  Filled 2023-07-16: qty 90, 90d supply, fill #0
  Filled 2023-11-29: qty 90, 90d supply, fill #1
  Filled 2024-06-12: qty 90, 90d supply, fill #2

## 2023-07-16 MED ORDER — METOPROLOL SUCCINATE ER 50 MG PO TB24
50.0000 mg | ORAL_TABLET | Freq: Every day | ORAL | 1 refills | Status: DC
  Filled 2023-07-16: qty 90, 90d supply, fill #0
  Filled 2023-11-18: qty 90, 90d supply, fill #1

## 2023-07-27 ENCOUNTER — Other Ambulatory Visit (HOSPITAL_COMMUNITY): Payer: Self-pay

## 2023-07-27 DIAGNOSIS — B9689 Other specified bacterial agents as the cause of diseases classified elsewhere: Secondary | ICD-10-CM | POA: Diagnosis not present

## 2023-07-27 DIAGNOSIS — L508 Other urticaria: Secondary | ICD-10-CM | POA: Diagnosis not present

## 2023-07-27 DIAGNOSIS — J019 Acute sinusitis, unspecified: Secondary | ICD-10-CM | POA: Diagnosis not present

## 2023-07-29 ENCOUNTER — Ambulatory Visit (INDEPENDENT_AMBULATORY_CARE_PROVIDER_SITE_OTHER): Payer: BC Managed Care – PPO | Admitting: Allergy and Immunology

## 2023-07-29 ENCOUNTER — Encounter: Payer: Self-pay | Admitting: Allergy and Immunology

## 2023-07-29 VITALS — BP 148/82 | HR 92 | Resp 14

## 2023-07-29 DIAGNOSIS — R430 Anosmia: Secondary | ICD-10-CM | POA: Diagnosis not present

## 2023-07-29 DIAGNOSIS — J3089 Other allergic rhinitis: Secondary | ICD-10-CM | POA: Diagnosis not present

## 2023-07-29 DIAGNOSIS — J988 Other specified respiratory disorders: Secondary | ICD-10-CM | POA: Diagnosis not present

## 2023-07-29 DIAGNOSIS — J4551 Severe persistent asthma with (acute) exacerbation: Secondary | ICD-10-CM

## 2023-07-29 DIAGNOSIS — D72820 Lymphocytosis (symptomatic): Secondary | ICD-10-CM | POA: Diagnosis not present

## 2023-07-29 DIAGNOSIS — L508 Other urticaria: Secondary | ICD-10-CM

## 2023-07-29 MED ORDER — AIRSUPRA 90-80 MCG/ACT IN AERO: 2.0000 | INHALATION_SPRAY | RESPIRATORY_TRACT | 1 refills | Status: AC | PRN

## 2023-07-29 MED ORDER — BREZTRI AEROSPHERE 160-9-4.8 MCG/ACT IN AERO: INHALATION_SPRAY | RESPIRATORY_TRACT | 5 refills | Status: AC

## 2023-07-29 MED ORDER — RYALTRIS 665-25 MCG/ACT NA SUSP: NASAL | 5 refills | Status: DC

## 2023-07-29 MED ORDER — IPRATROPIUM-ALBUTEROL 0.5-2.5 (3) MG/3ML IN SOLN
3.0000 mL | Freq: Once | RESPIRATORY_TRACT | Status: AC
  Administered 2023-07-29: 3 mL via RESPIRATORY_TRACT

## 2023-07-29 MED ORDER — METHYLPREDNISOLONE ACETATE 80 MG/ML IJ SUSP
80.0000 mg | Freq: Once | INTRAMUSCULAR | Status: AC
  Administered 2023-07-29: 80 mg via INTRAMUSCULAR

## 2023-07-29 NOTE — Progress Notes (Signed)
[place] - [place] - [place] - [place] - [place]   Follow-up Note  Referring Provider: Buckner Malta, MD Primary Provider: Buckner Malta, MD Date of Office Visit: 07/29/2023  Subjective:   Isabella Guerrero (DOB: 06/27/1991) is a 32 y.o. female who [facility] in re-evaluation of the following:  HPI: Isabella Guerrero presents to this clinic in evaluation of hives.  I have never seen her in this clinic and her last visit with Dr. Delorse Lek for her initial evaluation was 29 Dec 2022.  She believes that she has her urticaria under marginal control while using an H1 and H2 receptor blocker.  And she has developed a problem over the course of the past month that is somewhat different from her original issue for which she was seen in this clinic.  For the past month she has been having wheezing and coughing and shortness of breath and she has been having blotchy red areas across her body and she has been having both head fullness and ear fullness.  She did visit with her primary care doctor recently who started her on Singulair which really has not done very much regarding these issues.  It should be noted that around the same time that she developed this problem, within 1 week her daughter developed adenovirus infection.  And she also contracted COVID in September 2024 and was treated with prednisone.  She did resolve that issue completely.  She informs me that her white blood cell count was very high during that infection and she apparently had a check of her white blood cell count and she remembers her white blood cell count being 12 after that infection.  Allergies as of 07/29/2023       Reactions   Nsaids Other (See Comments)   GI Bleed   Latex Rash   Lidocaine Other (See Comments)   Pt reports feeling abnormal, feeling faint   Tape Rash   Or other adhesives         Medication List    aspirin EC 81 MG tablet Take 1 tablet (81 mg  total) by mouth daily. Swallow whole.   atorvastatin 10 MG tablet Commonly known as: LIPITOR Take 1 tablet (10 mg total) by mouth daily.   BENADRYL ALLERGY PO Take by mouth at bedtime.   busPIRone 10 MG tablet Commonly known as: BUSPAR Take 1 tablet (10 mg total) by mouth every 8 (eight) hours as needed for anxiety   cyclobenzaprine 5 MG tablet Commonly known as: FLEXERIL Take 1 tablet (5 mg total) by mouth at bedtime as needed for muscle spasm.   EPINEPHrine 0.3 mg/0.3 mL Soaj injection Commonly known as: EPI-PEN Inject into the muscle.   etonogestrel 68 MG Impl implant Commonly known as: NEXPLANON 1 each by Subdermal route once. In Left Arm   fluticasone 50 MCG/ACT nasal spray Commonly known as: FLONASE Place 1 spray into both nostrils daily.   hydrochlorothiazide 12.5 MG capsule Commonly known as: MICROZIDE Take 1 capsule (12.5 mg total) by mouth daily.   hydrOXYzine 25 MG tablet Commonly known as: ATARAX Take 1 tablet (25 mg total) by mouth at bedtime as needed for itching   levocetirizine 5 MG tablet Commonly known as: XYZAL Take one tablet twice daily   metoprolol succinate 50 MG 24 hr tablet Commonly known as: TOPROL-XL Take 1 tablet (50 mg total) by mouth daily.   montelukast 10 MG tablet Commonly known as: SINGULAIR Take 10 mg by mouth  daily.   pantoprazole 40 MG tablet Commonly known as: PROTONIX Take 1 tablet (40 mg total) by mouth daily.   Rybelsus 3 MG Tabs Generic drug: Semaglutide Take 1 tablet (3 mg total) by mouth daily.   Tart Cherry 1200 MG Caps Take by mouth daily.   Vitamin D3 1.25 MG (50000 UT) Caps Take by mouth once a week.    Past Medical History:  Diagnosis Date   Asthma    Back pain    BRBPR (bright red blood per rectum)    Breech presentation 03/01/2019   Cardiomegaly 07/22/2022   Carpal tunnel syndrome of right wrist 08/05/2020   Constipation    De Quervain's tenosynovitis, right 08/05/2020   Diabetes mellitus  (HCC) 07/01/2020   Dyslipidemia 07/22/2022   Dyspnea on exertion 07/22/2022   Fatigue    Foot pain    Gastroesophageal reflux disease    GDM (gestational diabetes mellitus) 12/2018   GERD (gastroesophageal reflux disease)    Gestational diabetes    Gestational diabetes mellitus 10/21/2022   diet   Hypertension    Irregular periods 07/02/2020   Joint pain    Knee pain    Late period 07/13/2018   Leukocytosis 03/30/2018   Lower back pain    Morbid obesity (HCC) 10/21/2022   BMI 55 Growth [place] at 32 weeks here   Obesity    Obstructive sleep apnea 07/22/2022   Palpitations    Prediabetes    Pregnancy 08/24/2018   RhD negative 10/21/2022   Rhogam at 28 weeks   S/P primary low transverse C-section 03/01/2019   Scapular dyskinesis 09/24/2020   Shortness of breath    Shortness of breath on exertion    Sleep apnea    Smoking 07/22/2022   Vitamin D deficiency 07/01/2020    Past Surgical History:  Procedure Laterality Date   BIOPSY  09/16/2020   Procedure: BIOPSY;  Surgeon: Lynann Bologna, MD;  Location: [facility];  Service: Endoscopy;;  EGD and COLON   CESAREAN SECTION N/A 03/01/2019   Procedure: CESAREAN SECTION;  Surgeon: Huel Cote, MD;  Location: [facility];  Service: Obstetrics;  Laterality: N/A;  need extra in OR and request RNFA   CHOLECYSTECTOMY  06/04/2022   COLONOSCOPY WITH PROPOFOL N/A 09/16/2020   Procedure: COLONOSCOPY WITH PROPOFOL;  Surgeon: Lynann Bologna, MD;  Location: [facility];  Service: Endoscopy;  Laterality: N/A;   DRUG INDUCED ENDOSCOPY     ESOPHAGOGASTRODUODENOSCOPY  12/28/2011   Mild gastritis. Otherwise normal EGD   ESOPHAGOGASTRODUODENOSCOPY (EGD) WITH PROPOFOL N/A 09/16/2020   Procedure: ESOPHAGOGASTRODUODENOSCOPY (EGD) WITH PROPOFOL;  Surgeon: Lynann Bologna, MD;  Location: [facility];  Service: Endoscopy;  Laterality: N/A;   FINGER SURGERY     POLYPECTOMY  09/16/2020   Procedure: POLYPECTOMY;  Surgeon: Lynann Bologna, MD;   Location: [facility];  Service: Endoscopy;;   TONSILLECTOMY     WISDOM TOOTH EXTRACTION      Review of systems negative except as noted in HPI / PMHx or noted below:  Review of Systems  Constitutional: Negative.   HENT: Negative.    Eyes: Negative.   Respiratory: Negative.    Cardiovascular: Negative.   Gastrointestinal: Negative.   Genitourinary: Negative.   Musculoskeletal: Negative.   Skin: Negative.   Neurological: Negative.   Endo/Heme/Allergies: Negative.   Psychiatric/Behavioral: Negative.       Objective:   Vitals:   07/29/23 1044  BP: (!) 148/82  Pulse: 92  Resp: 14  SpO2: 96%  Physical Exam Constitutional:      Appearance: She is not diaphoretic.  HENT:     Head: Normocephalic.     Right Ear: Tympanic membrane, ear canal and external ear normal.     Left Ear: Tympanic membrane, ear canal and external ear normal.     Nose: Nose normal. No mucosal edema or rhinorrhea.     Mouth/Throat:     Pharynx: Uvula midline. No oropharyngeal exudate.  Eyes:     Conjunctiva/sclera: Conjunctivae normal.  Neck:     Thyroid: No thyromegaly.     Trachea: Trachea normal. No tracheal tenderness or tracheal deviation.  Cardiovascular:     Rate and Rhythm: Normal rate and regular rhythm.     Heart sounds: Normal heart sounds, S1 normal and S2 normal. No murmur heard. Pulmonary:     Effort: No respiratory distress.     Breath sounds: No stridor. Wheezing (Bilateral expiratory wheezes all lung fields) present. No rales.  Lymphadenopathy:     Head:     Right side of head: No tonsillar adenopathy.     Left side of head: No tonsillar adenopathy.     Cervical: No cervical adenopathy.  Skin:    Findings: Rash (Blotchy red patches face extremities) present. No erythema.     Nails: There is no clubbing.  Neurological:     Mental Status: She is alert.     Diagnostics: Spirometry was performed after DuoNeb administration and demonstrated an FEV1 of 2.03 at 61 %  of predicted.  Results of blood tests obtained 01 Jan 2023 identifies lymphocyte 3300, AST 14U/L, ALT 13U/L, creatinine 0.6 Mg/DL, tryptase 6.9 UG/L, thyroid peroxidase antibody less than 1U/ML, negative alpha gal IgE.  Assessment and Plan:   1. Asthma, not well controlled, severe persistent, with acute exacerbation   2. Other allergic rhinitis   3. Anosmia   4. Respiratory tract infection   5. Chronic urticaria   6. Lymphocytosis     1.  DuoNeb administered in clinic today  2.  Depo-Medrol 80 IM delivered in clinic today  3.  Start azithromycin 500 mg - 1 tablet once a day for 3 days  4.  Start Breztri -2 inhalations twice a day with spacer (empty lungs)  5.  Start Ryaltris - 2 sprays each nostril 2 times per day  5.  Continue Xyzal, famotidine, Singulair  6.  If needed:   A. AirSupra - 2 inhalations every 6 hours  7.  Blood - CBC w/d (must include eosinophil count), ANCA w/R, HIV screen, Hep C ab.  8. Depomedrol may make blood sugar increase  9. Return to clinic in 4 weeks or earlier if problem  10. Biologic agent for overactive immune system???  Harpreet has very significant immune activation and the etiologic factors responsible for this immune of activation is not entirely clear.  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  I have started her on a collection of anti-inflammatory agents for her airway and also have given her azithromycin to cover possible mycoplasma infection.  She will remain on her combination of therapy directed against reflux.  I will see her back in this clinic in 4 weeks or earlier if there is a problem.  Laurette Schimke,  MD Allergy / Immunology [facility]

## 2023-07-29 NOTE — Patient Instructions (Addendum)
  1.  DuoNeb administered in clinic today  2.  Depo-Medrol 80 IM delivered in clinic today  3.  Start azithromycin 500 mg - 1 tablet once a day for 3 days  4.  Start Breztri -2 inhalations twice a day with spacer (empty lungs)  5.  Start Ryaltris - 2 sprays each nostril 2 times per day  5.  Continue Xyzal, famotidine, Singulair  6.  If needed:   A. AirSupra - 2 inhalations every 6 hours  7.  Blood - CBC w/d (must include eosinophil count), ANCA w/R, HIV screen, Hep C ab.  8. Depomedrol may make blood sugar increase  9. Return to clinic in 4 weeks or earlier if problem  10. Biologic agent for overactive immune system???

## 2023-08-06 ENCOUNTER — Other Ambulatory Visit: Payer: Self-pay

## 2023-08-06 ENCOUNTER — Other Ambulatory Visit (HOSPITAL_COMMUNITY): Payer: Self-pay

## 2023-08-10 DIAGNOSIS — L508 Other urticaria: Secondary | ICD-10-CM | POA: Diagnosis not present

## 2023-08-26 ENCOUNTER — Encounter: Payer: Self-pay | Admitting: Allergy and Immunology

## 2023-08-26 ENCOUNTER — Ambulatory Visit (INDEPENDENT_AMBULATORY_CARE_PROVIDER_SITE_OTHER): Payer: BC Managed Care – PPO | Admitting: Allergy and Immunology

## 2023-08-26 DIAGNOSIS — J4550 Severe persistent asthma, uncomplicated: Secondary | ICD-10-CM

## 2023-08-26 DIAGNOSIS — L508 Other urticaria: Secondary | ICD-10-CM

## 2023-08-26 DIAGNOSIS — G4733 Obstructive sleep apnea (adult) (pediatric): Secondary | ICD-10-CM | POA: Diagnosis not present

## 2023-08-26 DIAGNOSIS — L505 Cholinergic urticaria: Secondary | ICD-10-CM | POA: Diagnosis not present

## 2023-08-26 DIAGNOSIS — J3089 Other allergic rhinitis: Secondary | ICD-10-CM | POA: Diagnosis not present

## 2023-08-26 NOTE — Progress Notes (Signed)
[place] - [place] - [place] - [place] - [place]   Follow-up Note  Referring Provider: Buckner Malta, MD Primary Provider: Buckner Malta, MD Date of Office Visit: 08/26/2023  Subjective:   Isabella Guerrero (DOB: [birth year]) is a 33 y.o. female who [facility] 08[facility]/2023 in re-evaluation of the following:  HPI: Isabella Guerrero returns to this clinic in evaluation of urticaria, asthma, and presumed viral respiratory tract infection.  I last saw her in this clinic 29 July 2023.  During her last visit she appeared to have significant inflammation of her airway and we treated her with a systemic steroid and a broad-spectrum antibiotic and had her use anti-inflammatory agents for both her upper and lower airway.  She is better at this point in time yet still continues to use her anti-inflammatory rescue medicine twice a day.  She is not using any nasal steroids at this point in time and still has some nasal stuffiness.  She has not had any hives.  Her hives appear to be precipitated this point time only when she gets hot.  If she gets hot she definitely gets her an urticarial outbreak and she also gets some shortness of breath.  Allergies as of 08/26/2023       Reactions   Nsaids Other (See Comments)   GI Bleed   Latex Rash   Lidocaine Other (See Comments)   Pt reports feeling abnormal, feeling faint   Tape Rash   Or other adhesives         Medication List   Ryaltris 665-25 MCG/ACT Susp Generic drug: Olopatadine-Mometasone   Airsupra 90-80 MCG/ACT Aero Generic drug: Albuterol-Budesonide Inhale 2 puffs into the lungs as needed (every 6 hours as needed for cough, wheeze, shortness of breath.  Rinse, gargle, and spit after use).   aspirin EC 81 MG tablet Take 1 tablet (81 mg total) by mouth daily. Swallow whole.   atorvastatin 10 MG tablet Commonly known as: LIPITOR Take 1 tablet (10 mg total) by mouth daily.   BENADRYL ALLERGY  PO Take by mouth at bedtime.   Breztri Aerosphere 160-9-4.8 MCG/ACT Aero Generic drug: Budeson-Glycopyrrol-Formoterol Inhale two with spacer twice daily to prevent cough or wheeze.  Rinse, gargle, and spit after use.   busPIRone 10 MG tablet Commonly known as: BUSPAR Take 1 tablet (10 mg total) by mouth every 8 (eight) hours as needed for anxiety   cyclobenzaprine 5 MG tablet Commonly known as: FLEXERIL Take 1 tablet (5 mg total) by mouth at bedtime as needed for muscle spasm.   EPINEPHrine 0.3 mg/0.3 mL Soaj injection Commonly known as: EPI-PEN Inject into the muscle.   etonogestrel 68 MG Impl implant Commonly known as: NEXPLANON 1 each by Subdermal route once. In Left Arm   hydrochlorothiazide 12.5 MG capsule Commonly known as: MICROZIDE Take 1 capsule (12.5 mg total) by mouth daily.   hydrOXYzine 25 MG tablet Commonly known as: ATARAX Take 1 tablet (25 mg total) by mouth at bedtime as needed for itching   levocetirizine 5 MG tablet Commonly known as: XYZAL Take one tablet twice daily   metoprolol succinate 50 MG 24 hr tablet Commonly known as: TOPROL-XL Take 1 tablet (50 mg total) by mouth daily.   montelukast 10 MG tablet Commonly known as: SINGULAIR Take 10 mg by mouth daily.   pantoprazole 40 MG tablet Commonly known as: PROTONIX Take 1 tablet (40 mg total) by mouth daily.   Rybelsus 3 MG Tabs Generic drug: Semaglutide  Take 1 tablet (3 mg total) by mouth daily.   Tart Cherry 1200 MG Caps Take by mouth daily.   Vitamin D3 1.25 MG (50000 UT) Caps Take by mouth once a week.    Past Medical History:  Diagnosis Date   Asthma    Back pain    BRBPR (bright red blood per rectum)    Breech presentation 03/01/2019   Cardiomegaly 07/22/2022   Carpal tunnel syndrome of right wrist 08/05/2020   Constipation    De Quervain's tenosynovitis, right 08/05/2020   Diabetes mellitus (HCC) 07/01/2020   Dyslipidemia 07/22/2022   Dyspnea on exertion 07/22/2022    Fatigue    Foot pain    Gastroesophageal reflux disease    GDM (gestational diabetes mellitus) 12/2018   GERD (gastroesophageal reflux disease)    Gestational diabetes    Gestational diabetes mellitus 10/21/2022   diet   Hypertension    Irregular periods 07/02/2020   Joint pain    Knee pain    Late period 07/13/2018   Leukocytosis 03/30/2018   Lower back pain    Morbid obesity (HCC) 10/21/2022   BMI 55 Growth [place] at 32 weeks here   Obesity    Obstructive sleep apnea 07/22/2022   Palpitations    Prediabetes    Pregnancy 08/24/2018   RhD negative 10/21/2022   Rhogam at 28 weeks   S/P primary low transverse C-section 03/01/2019   Scapular dyskinesis 09/24/2020   Shortness of breath    Shortness of breath on exertion    Sleep apnea    Smoking 07/22/2022   Vitamin D deficiency 07/01/2020    Past Surgical History:  Procedure Laterality Date   BIOPSY  09/16/2020   Procedure: BIOPSY;  Surgeon: Lynann Bologna, MD;  Location: [facility];  Service: Endoscopy;;  EGD and COLON   CESAREAN SECTION N/A 03/01/2019   Procedure: CESAREAN SECTION;  Surgeon: Huel Cote, MD;  Location: [facility];  Service: Obstetrics;  Laterality: N/A;  need extra in OR and request RNFA   CHOLECYSTECTOMY  06/04/2022   COLONOSCOPY WITH PROPOFOL N/A 09/16/2020   Procedure: COLONOSCOPY WITH PROPOFOL;  Surgeon: Lynann Bologna, MD;  Location: [facility];  Service: Endoscopy;  Laterality: N/A;   DRUG INDUCED ENDOSCOPY     ESOPHAGOGASTRODUODENOSCOPY  12/28/2011   Mild gastritis. Otherwise normal EGD   ESOPHAGOGASTRODUODENOSCOPY (EGD) WITH PROPOFOL N/A 09/16/2020   Procedure: ESOPHAGOGASTRODUODENOSCOPY (EGD) WITH PROPOFOL;  Surgeon: Lynann Bologna, MD;  Location: [facility];  Service: Endoscopy;  Laterality: N/A;   FINGER SURGERY     POLYPECTOMY  09/16/2020   Procedure: POLYPECTOMY;  Surgeon: Lynann Bologna, MD;  Location: [facility];  Service: Endoscopy;;   TONSILLECTOMY     WISDOM TOOTH  EXTRACTION      Review of systems negative except as noted in HPI / PMHx or noted below:  Review of Systems  Constitutional: Negative.   HENT: Negative.    Eyes: Negative.   Respiratory: Negative.    Cardiovascular: Negative.   Gastrointestinal: Negative.   Genitourinary: Negative.   Musculoskeletal: Negative.   Skin: Negative.   Neurological: Negative.   Endo/Heme/Allergies: Negative.   Psychiatric/Behavioral: Negative.       Objective:   Physical Exam Constitutional:      Appearance: She is not diaphoretic.  HENT:     Head: Normocephalic.     Right Ear: Tympanic membrane, ear canal and external ear normal.     Left Ear: Tympanic membrane, ear canal and external ear normal.  Nose: Nose normal. No mucosal edema or rhinorrhea.     Mouth/Throat:     Pharynx: Uvula midline. No oropharyngeal exudate.  Eyes:     Conjunctiva/sclera: Conjunctivae normal.  Neck:     Thyroid: No thyromegaly.     Trachea: Trachea normal. No tracheal tenderness or tracheal deviation.  Cardiovascular:     Rate and Rhythm: Normal rate and regular rhythm.     Heart sounds: Normal heart sounds, S1 normal and S2 normal. No murmur heard. Pulmonary:     Effort: No respiratory distress.     Breath sounds: Normal breath sounds. No stridor. No wheezing or rales.  Lymphadenopathy:     Head:     Right side of head: No tonsillar adenopathy.     Left side of head: No tonsillar adenopathy.     Cervical: No cervical adenopathy.  Skin:    Findings: No erythema or rash.     Nails: There is no clubbing.  Neurological:     Mental Status: She is alert.     Diagnostics:    Spirometry was performed and demonstrated an FEV1 of 2.28 at 68 % of predicted.  Result of blood tests obtained 29 July 2023 identifies negative ANCA screen, hep C antibody negative, HIV antibody negative, WBC 13.7, absolute eosinophil 100, absolute lymphocyte 3700, hemoglobin 11.1, platelet 407  Assessment and Plan:   1. Not  well controlled severe persistent asthma   2. Other allergic rhinitis   3. Chronic urticaria   4. Cholinergic urticaria     1.  Continue Breztri -2 inhalations twice a day with spacer (empty lungs)  2.  Start Ryaltris - 2 sprays each nostril 1-2 times per day  3.  Continue Xyzal 5 mg + famotidine 20 mg - 1 tablet each 2 times per day  4.  If needed:   A. AirSupra - 2 inhalations every 6 hours  B. Epi-Pen, benadryl, MD/ER evaluation for allergic reaction  5. Submit for [employer] approval  6. Return to clinic in 8 weeks or earlier if problem  7. Review last chest x-ray  Jaiyana still has evidence of significant inflammation affecting her respiratory tract and I did encourage her to use some dose of nasal steroid while she continues to use inhaled triple agent and we will see her insurance company will pay for tezepelumab.  She appears to have urticaria with a heat component most likely cholinergic urticaria but there may also be some heat induced or exercise-induced anaphylaxis given the fact that she gets short of breath when she develops an urticarial outbreak from heat.  At this point she will continue with the treatment noted above and we will see her back in this clinic in 8 weeks or earlier if there is a problem.  If we do gain insurance approval for tezepelumab I would recommend she start this biologic agent.  Laurette Schimke, MD Allergy / Immunology [facility]

## 2023-08-26 NOTE — Patient Instructions (Addendum)
  1.  Continue Breztri -2 inhalations twice a day with spacer (empty lungs)  2.  Start Ryaltris - 2 sprays each nostril 1-2 times per day  3.  Continue Xyzal 5 mg + famotidine 20 mg - 1 tablet each 2 times per day  4.  If needed:   A. AirSupra - 2 inhalations every 6 hours  B. Epi-Pen, benadryl, MD/ER evaluation for allergic reaction  5. Submit for [employer] approval  6. Return to clinic in 8 weeks or earlier if problem  7. Review last chest x-ray

## 2023-08-30 ENCOUNTER — Encounter: Payer: Self-pay | Admitting: Allergy and Immunology

## 2023-09-02 ENCOUNTER — Encounter: Payer: Self-pay | Admitting: Allergy and Immunology

## 2023-09-02 ENCOUNTER — Telehealth: Payer: Self-pay | Admitting: *Deleted

## 2023-09-02 NOTE — Telephone Encounter (Signed)
-----   Message from ERIC J KOZLOW sent at 08/30/2023  6:58 AM EST ----- teze

## 2023-09-02 NOTE — Telephone Encounter (Signed)
L/m for patient to contact me to advise approval and submit to [employer] for Tezspire 

## 2023-10-01 NOTE — Telephone Encounter (Signed)
L/m for patient again to reach out to me °

## 2023-10-14 NOTE — Telephone Encounter (Signed)
 No response from patient

## 2023-11-18 ENCOUNTER — Other Ambulatory Visit (HOSPITAL_COMMUNITY): Payer: Self-pay

## 2023-11-29 ENCOUNTER — Other Ambulatory Visit (HOSPITAL_COMMUNITY): Payer: Self-pay

## 2023-12-03 ENCOUNTER — Other Ambulatory Visit (HOSPITAL_COMMUNITY): Payer: Self-pay

## 2023-12-03 DIAGNOSIS — R000 Tachycardia, unspecified: Secondary | ICD-10-CM | POA: Diagnosis not present

## 2023-12-03 MED ORDER — CARVEDILOL 6.25 MG PO TABS
6.2500 mg | ORAL_TABLET | Freq: Two times a day (BID) | ORAL | 1 refills | Status: AC
  Filled 2023-12-03: qty 180, 90d supply, fill #0

## 2023-12-04 ENCOUNTER — Other Ambulatory Visit (HOSPITAL_COMMUNITY): Payer: Self-pay

## 2023-12-20 DIAGNOSIS — R300 Dysuria: Secondary | ICD-10-CM | POA: Diagnosis not present

## 2023-12-22 DIAGNOSIS — R000 Tachycardia, unspecified: Secondary | ICD-10-CM | POA: Diagnosis not present

## 2023-12-24 DIAGNOSIS — Z6841 Body Mass Index (BMI) 40.0 and over, adult: Secondary | ICD-10-CM | POA: Diagnosis not present

## 2023-12-24 DIAGNOSIS — L989 Disorder of the skin and subcutaneous tissue, unspecified: Secondary | ICD-10-CM | POA: Diagnosis not present

## 2023-12-27 DIAGNOSIS — E559 Vitamin D deficiency, unspecified: Secondary | ICD-10-CM | POA: Diagnosis not present

## 2023-12-27 DIAGNOSIS — E538 Deficiency of other specified B group vitamins: Secondary | ICD-10-CM | POA: Diagnosis not present

## 2024-01-06 ENCOUNTER — Ambulatory Visit: Admitting: Cardiology

## 2024-01-26 ENCOUNTER — Ambulatory Visit: Attending: Cardiology | Admitting: Cardiology

## 2024-01-26 ENCOUNTER — Encounter: Payer: Self-pay | Admitting: Cardiology

## 2024-01-26 VITALS — BP 112/74 | HR 88 | Ht 66.0 in | Wt 348.6 lb

## 2024-01-26 DIAGNOSIS — E785 Hyperlipidemia, unspecified: Secondary | ICD-10-CM

## 2024-01-26 DIAGNOSIS — I4711 Inappropriate sinus tachycardia, so stated: Secondary | ICD-10-CM | POA: Diagnosis not present

## 2024-01-26 DIAGNOSIS — I10 Essential (primary) hypertension: Secondary | ICD-10-CM | POA: Diagnosis not present

## 2024-01-26 DIAGNOSIS — G4733 Obstructive sleep apnea (adult) (pediatric): Secondary | ICD-10-CM | POA: Diagnosis not present

## 2024-01-26 DIAGNOSIS — R0609 Other forms of dyspnea: Secondary | ICD-10-CM

## 2024-01-26 NOTE — Progress Notes (Unsigned)
 Cardiology Office Note:    Date:  01/26/2024   ID:  Isabella Guerrero, DOB Dec 10, 1990, MRN [id number]  PCP:  [name], MD  Cardiologist:  Ralene Burger, MD    Referring MD: [name], MD   Chief Complaint  Patient presents with   Results    History of Present Illness:    Isabella Guerrero is a 33 y.o. female past medical history significant for dyslipidemia recently recognized diabetes, morbid obesity, obstructive sleep apnea.  Initially referred to us  because of CT of the chest described cardiomegaly echocardiogram obtained that showed preserved ejection fraction additional problem is tachycardia.  She has been taking metoprolol , however lately still having palpitation while standing up with some dizziness and low blood pressure.  Metoprolol  has been changed to carvedilol  since that time she seems to be doing well.  Described to have fatigue tiredness shortness of breath.  She disappointed with herself because she gained significant amount of weight  Past Medical History:  Diagnosis Date   Asthma    Back pain    BRBPR (bright red blood per rectum)    Breech presentation 03/01/2019   Cardiomegaly 07/22/2022   Carpal tunnel syndrome of right wrist 08/05/2020   Constipation    De Quervain's tenosynovitis, right 08/05/2020   Diabetes mellitus (HCC) 07/01/2020   Dyslipidemia 07/22/2022   Dyspnea on exertion 07/22/2022   Fatigue    Foot pain    Gastroesophageal reflux disease    GDM (gestational diabetes mellitus) 12/2018   GERD (gastroesophageal reflux disease)    Gestational diabetes    Gestational diabetes mellitus 10/21/2022   diet   Hypertension    Irregular periods 07/02/2020   Joint pain    Knee pain    Late period 07/13/2018   Leukocytosis 03/30/2018   Lower back pain    Morbid obesity (HCC) 10/21/2022   BMI 55 Growth US  at 32 weeks here   Obesity    Obstructive sleep apnea 07/22/2022   Palpitations    Prediabetes    Pregnancy 08/24/2018   RhD  negative 10/21/2022   Rhogam at 28 weeks   S/P primary low transverse C-section 03/01/2019   Scapular dyskinesis 09/24/2020   Shortness of breath    Shortness of breath on exertion    Sleep apnea    Smoking 07/22/2022   Vitamin D  deficiency 07/01/2020    Past Surgical History:  Procedure Laterality Date   BIOPSY  09/16/2020   Procedure: BIOPSY;  Surgeon: Lajuan Pila, MD;  Location: [facility];  Service: Endoscopy;;  EGD and COLON   CESAREAN SECTION N/A 03/01/2019   Procedure: CESAREAN SECTION;  Surgeon: Rogene Claude, MD;  Location: [facility];  Service: Obstetrics;  Laterality: N/A;  need extra in OR and request RNFA   CHOLECYSTECTOMY  06/04/2022   COLONOSCOPY WITH PROPOFOL  N/A 09/16/2020   Procedure: COLONOSCOPY WITH PROPOFOL ;  Surgeon: Lajuan Pila, MD;  Location: [facility];  Service: Endoscopy;  Laterality: N/A;   DRUG INDUCED ENDOSCOPY     ESOPHAGOGASTRODUODENOSCOPY  12/28/2011   Mild gastritis. Otherwise normal EGD   ESOPHAGOGASTRODUODENOSCOPY (EGD) WITH PROPOFOL  N/A 09/16/2020   Procedure: ESOPHAGOGASTRODUODENOSCOPY (EGD) WITH PROPOFOL ;  Surgeon: Lajuan Pila, MD;  Location: [facility];  Service: Endoscopy;  Laterality: N/A;   FINGER SURGERY     POLYPECTOMY  09/16/2020   Procedure: POLYPECTOMY;  Surgeon: Lajuan Pila, MD;  Location: [facility];  Service: Endoscopy;;   TONSILLECTOMY     WISDOM TOOTH EXTRACTION      Current  Medications: Current Meds  Medication Sig   AIRSUPRA  90-80 MCG/ACT AERO Inhale 2 puffs into the lungs as needed (every 6 hours as needed for cough, wheeze, shortness of breath.  Rinse, gargle, and spit after use).   aspirin  EC 81 MG tablet Take 1 tablet (81 mg total) by mouth daily. Swallow whole.   atorvastatin  (LIPITOR) 10 MG tablet Take 1 tablet (10 mg total) by mouth daily.   BREZTRI  AEROSPHERE 160-9-4.8 MCG/ACT AERO Inhale two with spacer twice daily to prevent cough or wheeze.  Rinse, gargle, and spit after use.   busPIRone   (BUSPAR ) 10 MG tablet Take 1 tablet (10 mg total) by mouth every 8 (eight) hours as needed for anxiety   carvedilol  (COREG ) 6.25 MG tablet Take 1 tablet (6.25 mg total) by mouth 2 (two) times daily. Replaces metoprolol    Cholecalciferol  (VITAMIN D3) 1.25 MG (50000 UT) CAPS Take by mouth once a week.   cyclobenzaprine  (FLEXERIL ) 5 MG tablet Take 1 tablet (5 mg total) by mouth at bedtime as needed for muscle spasm.   diphenhydrAMINE  HCl (BENADRYL  ALLERGY PO) Take by mouth at bedtime.   EPINEPHrine 0.3 mg/0.3 mL IJ SOAJ injection Inject into the muscle.   etonogestrel  (NEXPLANON ) 68 MG IMPL implant 1 each by Subdermal route once. In Left Arm   hydrochlorothiazide  (MICROZIDE ) 12.5 MG capsule Take 1 capsule (12.5 mg total) by mouth daily.   hydrOXYzine  (ATARAX ) 25 MG tablet Take 1 tablet (25 mg total) by mouth at bedtime as needed for itching   levocetirizine (XYZAL ) 5 MG tablet Take one tablet twice daily   montelukast (SINGULAIR) 10 MG tablet Take 10 mg by mouth daily.   pantoprazole  (PROTONIX ) 40 MG tablet Take 1 tablet (40 mg total) by mouth daily.   Semaglutide  (RYBELSUS ) 3 MG TABS Take 1 tablet (3 mg total) by mouth daily.   Tart Cherry 1200 MG CAPS Take by mouth daily.     Allergies:   Nsaids, Other, Latex, Lidocaine , and Tape   Social History   Socioeconomic History   Marital status: Single    Spouse name: Not on file   Number of children: Not on file   Years of education: Not on file   Highest education level: Not on file  Occupational History   Occupation: homemaker  Tobacco Use   Smoking status: Every Day    Current packs/day: 0.00    Types: Cigarettes    Last attempt to quit: 07/08/2018    Years since quitting: 5.5   Smokeless tobacco: Never   Tobacco comments:    Currently taking medication to assist in cessation  Vaping Use   Vaping status: Never Used  Substance and Sexual Activity   Alcohol use: Not Currently    Alcohol/week: 1.0 standard drink of alcohol    Types:  1 Cans of beer per week    Comment: once in a while   Drug use: Never   Sexual activity: Yes    Partners: Male    Birth control/protection: Implant  Other Topics Concern   Not on file  Social History Narrative   Not on file   Social Drivers of Health   Financial Resource Strain: Low Risk  (02/20/2019)   Overall Financial Resource Strain (CARDIA)    Difficulty of Paying Living Expenses: Not hard at all  Food Insecurity: No Food Insecurity (02/20/2019)   Hunger Vital Sign    Worried About Running Out of Food in the Last Year: Never true    Ran Out of  Food in the Last Year: Never true  Transportation Needs: Unknown (02/20/2019)   PRAPARE - Administrator, Civil Service (Medical): No    Lack of Transportation (Non-Medical): Not on file  Physical Activity: Not on file  Stress: Stress Concern Present (02/20/2019)   [employer] of Occupational Health - Occupational Stress Questionnaire    Feeling of Stress : To some extent  Social Connections: Not on file     Family History: The patient's family history includes Cancer in her father; Colon cancer in her father and maternal grandmother; Diabetes in her father and mother; Esophageal cancer in her maternal uncle; Heart disease in her father and mother; Hyperlipidemia in her father and mother; Hypertension in her father and mother; Obesity in her father and mother; Sleep apnea in her father; Thyroid  disease in her mother. ROS:   Please see the history of present illness.    All 14 point review of systems negative except as described per history of present illness  EKGs/Labs/Other Studies Reviewed:    EKG Interpretation Date/Time:  Wednesday January 26 2024 14:45:03 EDT Ventricular Rate:  88 PR Interval:  188 QRS Duration:  94 QT Interval:  388 QTC Calculation: 469 R Axis:   2  Text Interpretation: Normal sinus rhythm Low voltage QRS Cannot rule out Anterior infarct , age undetermined Abnormal ECG No previous ECGs  available Confirmed by Ralene Burger [phone]) on 01/26/2024 2:49:26 PM    Recent Labs: No results found for requested labs within last 365 days.  Recent Lipid Panel    Component Value Date/Time   CHOL 240 (A) 04/03/2022 0000   CHOL 222 (H) 09/09/2021 0825   TRIG 134 04/03/2022 0000   HDL 37 04/03/2022 0000   HDL 35 (L) 09/09/2021 0825   LDLCALC 176 04/03/2022 0000   LDLCALC 163 (H) 09/09/2021 0825    Physical Exam:    VS:  BP 112/74 (BP Location: Right Arm, Patient Position: Sitting)   Pulse 88   Ht 5' 6 (1.676 m)   Wt (!) 348 lb 9.6 oz (158.1 kg)   SpO2 97%   BMI 56.27 kg/m     Wt Readings from Last 3 Encounters:  01/26/24 (!) 348 lb 9.6 oz (158.1 kg)  12/29/22 (!) 329 lb (149.2 kg)  10/23/22 (!) 330 lb 6.4 oz (149.9 kg)     GEN:  Well nourished, well developed in no acute distress HEENT: Normal NECK: No JVD; No carotid bruits LYMPHATICS: No lymphadenopathy CARDIAC: RRR, no murmurs, no rubs, no gallops RESPIRATORY:  Clear to auscultation without rales, wheezing or rhonchi  ABDOMEN: Soft, non-tender, non-distended MUSCULOSKELETAL:  No edema; No deformity  SKIN: Warm and dry LOWER EXTREMITIES: no swelling NEUROLOGIC:  Alert and oriented x 3 PSYCHIATRIC:  Normal affect   ASSESSMENT:    1. Primary hypertension   2. Inappropriate sinus tachycardia (HCC)   3. Obstructive sleep apnea   4. Dyslipidemia    PLAN:    In order of problems listed above:  Tachycardia.  She is successfully suppressed with carvedilol  seems to be tolerating quite well. Episodes of hypotension with some orthostasis, will recheck echocardiogram ensure her left ventricle ejection fraction is preserved. Obstructive sleep apnea being followed by internal medicine team. Dyslipidemia I did review K PN total cholesterol 143 HDL 32 continue present management   Medication Adjustments/Labs and Tests Ordered: Current medicines are reviewed at length with the patient today.  Concerns regarding  medicines are outlined above.  Orders Placed This Encounter  Procedures   EKG 12-Lead   Medication changes: No orders of the defined types were placed in this encounter.   Signed, Manfred Seed, MD, [facility] 01/26/2024 3:07 PM    [facility]

## 2024-01-26 NOTE — Patient Instructions (Signed)
 Medication Instructions:  Your physician recommends that you continue on your current medications as directed. Please refer to the Current Medication list given to you today.  *If you need a refill on your cardiac medications before your next appointment, please call your pharmacy*   Lab Work: None Ordered If you have labs (blood work) drawn today and your tests are completely normal, you will receive your results only by: MyChart Message (if you have MyChart) OR A paper copy in the mail If you have any lab test that is abnormal or we need to change your treatment, we will call you to review the results.   Testing/Procedures: Your physician has requested that you have an echocardiogram. Echocardiography is a painless test that uses sound waves to create images of your heart. It provides your doctor with information about the size and shape of your heart and how well your heart's chambers and valves are working. This procedure takes approximately one hour. There are no restrictions for this procedure. Please do NOT wear cologne, perfume, aftershave, or lotions (deodorant is allowed). Please arrive 15 minutes prior to your appointment time.  Please note: We ask at that you not bring children with you during ultrasound (echo/ vascular) testing. Due to room size and safety concerns, children are not allowed in the ultrasound rooms during exams. Our front office staff cannot provide observation of children in our lobby area while testing is being conducted. An adult accompanying a patient to their appointment will only be allowed in the ultrasound room at the discretion of the ultrasound technician under special circumstances. We apologize for any inconvenience.    Follow-Up: At [facility], you and your health needs are our priority.  As part of our continuing mission to provide you with exceptional heart care, we have created designated Provider Care Teams.  These Care Teams include your  primary Cardiologist (physician) and Advanced Practice Providers (APPs -  Physician Assistants and Nurse Practitioners) who all work together to provide you with the care you need, when you need it.  We recommend signing up for the patient portal called "MyChart".  Sign up information is provided on this After Visit Summary.  MyChart is used to connect with patients for Virtual Visits (Telemedicine).  Patients are able to view lab/test results, encounter notes, upcoming appointments, etc.  Non-urgent messages can be sent to your provider as well.   To learn more about what you can do with MyChart, go to [email or web address].    Your next appointment:   4 month(s)  The format for your next appointment:   In Person  Provider:   Gypsy Balsam, MD    Other Instructions NA

## 2024-02-01 ENCOUNTER — Ambulatory Visit: Attending: Cardiology

## 2024-02-01 DIAGNOSIS — R0609 Other forms of dyspnea: Secondary | ICD-10-CM

## 2024-02-03 LAB — ECHOCARDIOGRAM COMPLETE
Area-P 1/2: 5.13 cm2
S' Lateral: 3.6 cm

## 2024-02-08 ENCOUNTER — Telehealth: Payer: Self-pay

## 2024-02-08 ENCOUNTER — Ambulatory Visit: Payer: Self-pay | Admitting: Cardiology

## 2024-02-08 NOTE — Telephone Encounter (Signed)
 Left message on My Chart with Echo results per Dr. Vanetta Shawl note. Routed to PCP.

## 2024-02-09 ENCOUNTER — Telehealth: Payer: Self-pay

## 2024-02-09 NOTE — Telephone Encounter (Signed)
Pt viewed results on My Chart per Dr. Krasowski's note. Routed to PCP.  

## 2024-02-17 ENCOUNTER — Other Ambulatory Visit: Payer: Self-pay

## 2024-02-17 ENCOUNTER — Other Ambulatory Visit (HOSPITAL_COMMUNITY): Payer: Self-pay

## 2024-02-17 MED ORDER — HYDROCHLOROTHIAZIDE 12.5 MG PO CAPS
12.5000 mg | ORAL_CAPSULE | Freq: Every day | ORAL | 1 refills | Status: AC
  Filled 2024-06-12: qty 90, 90d supply, fill #0

## 2024-02-17 MED ORDER — PANTOPRAZOLE SODIUM 40 MG PO TBEC
40.0000 mg | DELAYED_RELEASE_TABLET | Freq: Every day | ORAL | 2 refills | Status: AC
  Filled 2024-02-17: qty 90, 90d supply, fill #0

## 2024-02-17 MED ORDER — ATORVASTATIN CALCIUM 10 MG PO TABS
10.0000 mg | ORAL_TABLET | Freq: Every day | ORAL | 1 refills | Status: AC
  Filled 2024-02-17: qty 90, 90d supply, fill #0

## 2024-02-17 MED ORDER — BUSPIRONE HCL 10 MG PO TABS
10.0000 mg | ORAL_TABLET | Freq: Three times a day (TID) | ORAL | 1 refills | Status: AC | PRN
  Filled 2024-02-17: qty 180, 60d supply, fill #0

## 2024-03-01 ENCOUNTER — Encounter (HOSPITAL_COMMUNITY): Payer: Self-pay

## 2024-03-01 ENCOUNTER — Other Ambulatory Visit: Payer: Self-pay

## 2024-03-01 ENCOUNTER — Other Ambulatory Visit (HOSPITAL_COMMUNITY): Payer: Self-pay

## 2024-03-01 MED ORDER — FLUTICASONE PROPIONATE 50 MCG/ACT NA SUSP
2.0000 | Freq: Two times a day (BID) | NASAL | 3 refills | Status: DC
  Filled 2024-03-01: qty 16, 30d supply, fill #0

## 2024-03-01 MED ORDER — ALBUTEROL SULFATE (2.5 MG/3ML) 0.083% IN NEBU
2.5000 mg | INHALATION_SOLUTION | Freq: Four times a day (QID) | RESPIRATORY_TRACT | 1 refills | Status: DC
  Filled 2024-03-01: qty 42, 4d supply, fill #0

## 2024-03-01 MED ORDER — LEVOCETIRIZINE DIHYDROCHLORIDE 5 MG PO TABS
5.0000 mg | ORAL_TABLET | Freq: Two times a day (BID) | ORAL | 3 refills | Status: DC
  Filled 2024-03-01: qty 180, 90d supply, fill #0

## 2024-03-01 MED ORDER — MONTELUKAST SODIUM 10 MG PO TABS
10.0000 mg | ORAL_TABLET | Freq: Every evening | ORAL | 1 refills | Status: DC
  Filled 2024-03-01: qty 90, 90d supply, fill #0

## 2024-03-08 ENCOUNTER — Encounter: Payer: Self-pay | Admitting: Allergy and Immunology

## 2024-03-08 ENCOUNTER — Ambulatory Visit (INDEPENDENT_AMBULATORY_CARE_PROVIDER_SITE_OTHER): Payer: Self-pay | Admitting: Allergy and Immunology

## 2024-03-08 VITALS — BP 128/88 | HR 90 | Resp 20

## 2024-03-08 DIAGNOSIS — J3089 Other allergic rhinitis: Secondary | ICD-10-CM

## 2024-03-08 DIAGNOSIS — J4550 Severe persistent asthma, uncomplicated: Secondary | ICD-10-CM

## 2024-03-08 DIAGNOSIS — L505 Cholinergic urticaria: Secondary | ICD-10-CM

## 2024-03-08 MED ORDER — METHYLPREDNISOLONE ACETATE 80 MG/ML IJ SUSP
80.0000 mg | Freq: Once | INTRAMUSCULAR | Status: AC
  Administered 2024-03-08: 80 mg via INTRAMUSCULAR

## 2024-03-08 MED ORDER — TEZEPELUMAB-EKKO 210 MG/1.91ML ~~LOC~~ SOSY
210.0000 mg | PREFILLED_SYRINGE | SUBCUTANEOUS | Status: AC
  Administered 2024-03-08 – 2024-04-11 (×2): 210 mg via SUBCUTANEOUS

## 2024-03-08 NOTE — Progress Notes (Unsigned)
 [place] - [place] - [place] - [place] - [place]   Follow-up Note  Referring Provider: Clemmie Nest, MD Primary Provider: Clemmie Nest, MD Date of Office Visit: 03/08/2024  Subjective:   Isabella Guerrero (DOB: 06-10-1991) is a 33 y.o. female who [facility] in re-evaluation of the following:  HPI: Hampton returns to this clinic in evaluation of of asthma and urticaria.  I last saw her in this clinic 26 August 2023.  Because her asthma was not well-controlled during her last visit even while using a triple inhaler we initiated insurance approval for tezepelumab  administration but because of some logistical issue [employer] never contacted this clinic about following through with the administration of that medication.  She has had a difficult time with her asthma over the course of the past few weeks.  This may have been triggered off by a viral respiratory tract infection as her husband apparently had a cold and she did develop some rhinitis around the time that she developed wheezing and coughing and shortness of breath.  She ended up in the emergency room recently and was treated with systemic steroids and was given more systemic steroids by her primary care doctor recently.  She is improved a little as a result of that administration but still has daily wheezing and coughing and using a bronchodilator multiple times per day.  Allergies as of 03/08/2024       Reactions   Nsaids Other (See Comments)   GI Bleed   Other Dermatitis   Latex Rash   Lidocaine  Other (See Comments)   Pt reports feeling abnormal, feeling faint   Tape Rash   Or other adhesives         Medication List    Airsupra  90-80 MCG/ACT Aero Generic drug: Albuterol -Budesonide Inhale 2 puffs into the lungs as needed (every 6 hours as needed for cough, wheeze, shortness of breath.  Rinse, gargle, and spit after use).   aspirin  EC 81 MG tablet Take 1  tablet (81 mg total) by mouth daily. Swallow whole.   atorvastatin  10 MG tablet Commonly known as: LIPITOR Take 1 tablet (10 mg total) by mouth daily.   atorvastatin  10 MG tablet Commonly known as: Lipitor Take 1 tablet (10 mg total) by mouth daily.   BENADRYL  ALLERGY PO Take by mouth at bedtime.   Breztri  Aerosphere 160-9-4.8 MCG/ACT Aero inhaler Generic drug: budesonide-glycopyrrolate-formoterol Inhale two with spacer twice daily to prevent cough or wheeze.  Rinse, gargle, and spit after use.   busPIRone  10 MG tablet Commonly known as: BUSPAR  Take 1 tablet (10 mg total) by mouth every 8 (eight) hours as needed for anxiety   busPIRone  10 MG tablet Commonly known as: BUSPAR  Take 1 tablet (10 mg total) by mouth every 8 (eight) hours as needed for anxiety   carvedilol  6.25 MG tablet Commonly known as: COREG  Take 1 tablet (6.25 mg total) by mouth 2 (two) times daily. Replaces metoprolol    cyclobenzaprine  5 MG tablet Commonly known as: FLEXERIL  Take 1 tablet (5 mg total) by mouth at bedtime as needed for muscle spasm.   EPINEPHrine 0.3 mg/0.3 mL Soaj injection Commonly known as: EPI-PEN Inject into the muscle.   etonogestrel  68 MG Impl implant Commonly known as: NEXPLANON  1 each by Subdermal route once. In Left Arm   hydrochlorothiazide  12.5 MG capsule Commonly known as: MICROZIDE  Take 1 capsule (12.5 mg total) by mouth daily.   hydrochlorothiazide  12.5 MG capsule Commonly known as:  MICROZIDE  Take 1 capsule (12.5 mg total) by mouth daily.   hydrOXYzine  25 MG tablet Commonly known as: ATARAX  Take 1 tablet (25 mg total) by mouth at bedtime as needed for itching   levocetirizine 5 MG tablet Commonly known as: XYZAL  Take one tablet twice daily   montelukast  10 MG tablet Commonly known as: SINGULAIR  Take 10 mg by mouth daily.   pantoprazole  40 MG tablet Commonly known as: PROTONIX  Take 1 tablet (40 mg total) by mouth daily.   pantoprazole  40 MG tablet Commonly  known as: PROTONIX  Take 1 tablet (40 mg total) by mouth daily.   Rybelsus  3 MG Tabs Generic drug: Semaglutide  Take 1 tablet (3 mg total) by mouth daily.   Tart Cherry 1200 MG Caps Take by mouth daily.   Vitamin D3 1.25 MG (50000 UT) Caps Take by mouth once a week.    Past Medical History:  Diagnosis Date   Asthma    Back pain    BRBPR (bright red blood per rectum)    Breech presentation 03/01/2019   Cardiomegaly 07/22/2022   Carpal tunnel syndrome of right wrist 08/05/2020   Constipation    De Quervain's tenosynovitis, right 08/05/2020   Diabetes mellitus (HCC) 07/01/2020   Dyslipidemia 07/22/2022   Dyspnea on exertion 07/22/2022   Fatigue    Foot pain    Gastroesophageal reflux disease    GDM (gestational diabetes mellitus) 12/2018   GERD (gastroesophageal reflux disease)    Gestational diabetes    Gestational diabetes mellitus 10/21/2022   diet   Hypertension    Irregular periods 07/02/2020   Joint pain    Knee pain    Late period 07/13/2018   Leukocytosis 03/30/2018   Lower back pain    Morbid obesity (HCC) 10/21/2022   BMI 55 Growth US  at 32 weeks here   Obesity    Obstructive sleep apnea 07/22/2022   Palpitations    Prediabetes    Pregnancy 08/24/2018   RhD negative 10/21/2022   Rhogam at 28 weeks   S/P primary low transverse C-section 03/01/2019   Scapular dyskinesis 09/24/2020   Shortness of breath    Shortness of breath on exertion    Sleep apnea    Smoking 07/22/2022   Vitamin D  deficiency 07/01/2020    Past Surgical History:  Procedure Laterality Date   BIOPSY  09/16/2020   Procedure: BIOPSY;  Surgeon: Charlanne Groom, MD;  Location: [facility];  Service: Endoscopy;;  EGD and COLON   CESAREAN SECTION N/A 03/01/2019   Procedure: CESAREAN SECTION;  Surgeon: Estelle Service, MD;  Location: [facility];  Service: Obstetrics;  Laterality: N/A;  need extra in OR and request RNFA   CHOLECYSTECTOMY  06/04/2022   COLONOSCOPY WITH PROPOFOL  N/A  09/16/2020   Procedure: COLONOSCOPY WITH PROPOFOL ;  Surgeon: Charlanne Groom, MD;  Location: [facility];  Service: Endoscopy;  Laterality: N/A;   DRUG INDUCED ENDOSCOPY     ESOPHAGOGASTRODUODENOSCOPY  12/28/2011   Mild gastritis. Otherwise normal EGD   ESOPHAGOGASTRODUODENOSCOPY (EGD) WITH PROPOFOL  N/A 09/16/2020   Procedure: ESOPHAGOGASTRODUODENOSCOPY (EGD) WITH PROPOFOL ;  Surgeon: Charlanne Groom, MD;  Location: [facility];  Service: Endoscopy;  Laterality: N/A;   FINGER SURGERY     POLYPECTOMY  09/16/2020   Procedure: POLYPECTOMY;  Surgeon: Charlanne Groom, MD;  Location: [facility];  Service: Endoscopy;;   TONSILLECTOMY     WISDOM TOOTH EXTRACTION      Review of systems negative except as noted in HPI / PMHx or noted below:  Review  of Systems  Constitutional: Negative.   HENT: Negative.    Eyes: Negative.   Respiratory: Negative.    Cardiovascular: Negative.   Gastrointestinal: Negative.   Genitourinary: Negative.   Musculoskeletal: Negative.   Skin: Negative.   Neurological: Negative.   Endo/Heme/Allergies: Negative.   Psychiatric/Behavioral: Negative.       Objective:   Vitals:   03/08/24 1131  BP: 128/88  Pulse: 90  Resp: 20  SpO2: 97%          Physical Exam Constitutional:      Appearance: She is not diaphoretic.  HENT:     Head: Normocephalic.     Right Ear: Tympanic membrane, ear canal and external ear normal.     Left Ear: Tympanic membrane, ear canal and external ear normal.     Nose: Nose normal. No mucosal edema or rhinorrhea.     Mouth/Throat:     Pharynx: Uvula midline. No oropharyngeal exudate.  Eyes:     Conjunctiva/sclera: Conjunctivae normal.  Neck:     Thyroid : No thyromegaly.     Trachea: Trachea normal. No tracheal tenderness or tracheal deviation.  Cardiovascular:     Rate and Rhythm: Normal rate and regular rhythm.     Heart sounds: Normal heart sounds, S1 normal and S2 normal. No murmur heard. Pulmonary:     Effort: No  respiratory distress.     Breath sounds: No stridor. Wheezing (Bilateral expiratory wheezing posterior lung fields) present. No rales.  Lymphadenopathy:     Head:     Right side of head: No tonsillar adenopathy.     Left side of head: No tonsillar adenopathy.     Cervical: No cervical adenopathy.  Skin:    Findings: No erythema or rash.     Nails: There is no clubbing.  Neurological:     Mental Status: She is alert.     Diagnostics: Spirometry was performed and demonstrated an FEV1 at 60 % of predicted.   Assessment and Plan:   1. Not well controlled severe persistent asthma   2. Other allergic rhinitis   3. Cholinergic urticaria    1.  Continue Breztri  - 2 inhalations 2 times per day day with spacer (empty lungs)  2.  Continue Flonase  - 2 sprays each nostril 1 time per day  3.  Continue Xyzal  5 mg + famotidine 20 mg - 1 tablet each 2 times per day  4. Tezepelumab  injection today (sample)  5. Depomedrol 80 IM delivered in clinic today  6. Submit for income based assistance for tezepelumab    7.  If needed:   A. AirSupra  - 2 inhalations every 6 hours  B. Epi-Pen, benadryl , MD/ER evaluation for allergic reaction  8. Return to clinic in 8 weeks or earlier if problem  Hagan has uncontrolled asthma and we are going to treat her with a selection of anti-inflammatory agents including starting anti-TSL P antibody today while she maintains inhaled and nasal steroids as noted above.  I will see her back in this clinic in 8 weeks or earlier if there is a problem.  Camellia Denis, MD Allergy / Immunology [facility]

## 2024-03-08 NOTE — Patient Instructions (Addendum)
  1.  Continue Breztri  - 2 inhalations 2 times per day day with spacer (empty lungs)  2.  Continue Flonase  - 2 sprays each nostril 1 time per day  3.  Continue Xyzal  5 mg + famotidine 20 mg - 1 tablet each 2 times per day  4. Tezepelumab  injection today (sample)  5. Depomedrol 80 IM delivered in clinic today  6. Submit for income based assistance for tezepelumab    7.  If needed:   A. AirSupra  - 2 inhalations every 6 hours  B. Epi-Pen, benadryl , MD/ER evaluation for allergic reaction  8. Return to clinic in 8 weeks or earlier if problem

## 2024-03-08 NOTE — Progress Notes (Unsigned)
 Started Tezpsire sample today. Will continue every 4 weeks. Consent signed and no issues after observed wait time.

## 2024-03-09 NOTE — Addendum Note (Signed)
 Addended by: Ladon Heney on: 03/09/2024 04:49 PM   Modules accepted: Orders

## 2024-03-21 ENCOUNTER — Other Ambulatory Visit (HOSPITAL_COMMUNITY): Payer: Self-pay

## 2024-03-21 ENCOUNTER — Other Ambulatory Visit: Payer: Self-pay

## 2024-03-21 MED ORDER — CARVEDILOL 6.25 MG PO TABS
6.2500 mg | ORAL_TABLET | Freq: Two times a day (BID) | ORAL | 1 refills | Status: AC
  Filled 2024-03-21: qty 180, 90d supply, fill #0
  Filled 2024-07-03: qty 180, 90d supply, fill #1

## 2024-03-22 ENCOUNTER — Other Ambulatory Visit (HOSPITAL_COMMUNITY): Payer: Self-pay

## 2024-04-11 ENCOUNTER — Ambulatory Visit (INDEPENDENT_AMBULATORY_CARE_PROVIDER_SITE_OTHER): Payer: Self-pay | Admitting: *Deleted

## 2024-04-11 DIAGNOSIS — J4550 Severe persistent asthma, uncomplicated: Secondary | ICD-10-CM

## 2024-04-11 NOTE — Progress Notes (Signed)
 No charge per Dr. Kozlow.

## 2024-05-08 ENCOUNTER — Telehealth: Payer: Self-pay | Admitting: *Deleted

## 2024-05-08 NOTE — Telephone Encounter (Signed)
 L/m for patient [employer] trying to reach patient X 3 to schedule her Tezspire  pen delivery since she has been approved for PAP. Number and instructions left for patient to call [employer]

## 2024-05-16 ENCOUNTER — Telehealth: Payer: Self-pay

## 2024-05-16 NOTE — Telephone Encounter (Signed)
 Patient informed and verbalized understanding

## 2024-05-16 NOTE — Telephone Encounter (Signed)
 Patient called wanting to know if she should get a flu/covid injection this year. She states because her immune system is all over the place this year she did not know if it would be a good idea. Please advice.

## 2024-05-26 ENCOUNTER — Telehealth: Payer: Self-pay

## 2024-05-26 NOTE — Telephone Encounter (Signed)
 Faxed Completed and signed ozempic  reorder from to [facility]

## 2024-06-12 ENCOUNTER — Other Ambulatory Visit: Payer: Self-pay

## 2024-06-12 ENCOUNTER — Other Ambulatory Visit (HOSPITAL_COMMUNITY): Payer: Self-pay

## 2024-07-03 ENCOUNTER — Other Ambulatory Visit (HOSPITAL_COMMUNITY): Payer: Self-pay
